# Patient Record
Sex: Female | Born: 1970
Health system: Southern US, Community
[De-identification: ages and names within clinical notes are randomized; demographics above are authoritative.]

## PROBLEM LIST (undated history)

## (undated) DIAGNOSIS — N200 Calculus of kidney: Secondary | ICD-10-CM

## (undated) DIAGNOSIS — E119 Type 2 diabetes mellitus without complications: Secondary | ICD-10-CM

## (undated) DIAGNOSIS — I10 Essential (primary) hypertension: Secondary | ICD-10-CM

## (undated) DIAGNOSIS — D649 Anemia, unspecified: Secondary | ICD-10-CM

## (undated) DIAGNOSIS — R011 Cardiac murmur, unspecified: Secondary | ICD-10-CM

## (undated) DIAGNOSIS — R911 Solitary pulmonary nodule: Secondary | ICD-10-CM

## (undated) DIAGNOSIS — R42 Dizziness and giddiness: Secondary | ICD-10-CM

## (undated) HISTORY — PX: WISDOM TOOTH EXTRACTION: SHX21

## (undated) HISTORY — DX: Type 2 diabetes mellitus without complications: E11.9

## (undated) HISTORY — PX: TUBAL LIGATION: SHX77

## (undated) HISTORY — DX: Cardiac murmur, unspecified: R01.1

## (undated) HISTORY — DX: Solitary pulmonary nodule: R91.1

## (undated) HISTORY — DX: Dizziness and giddiness: R42

## (undated) HISTORY — DX: Anemia, unspecified: D64.9

## (undated) HISTORY — DX: Calculus of kidney: N20.0

---

## 2007-01-17 ENCOUNTER — Emergency Department (HOSPITAL_COMMUNITY): Admission: EM | Admit: 2007-01-17 | Discharge: 2007-01-17 | Payer: Self-pay | Admitting: Emergency Medicine

## 2010-08-03 ENCOUNTER — Emergency Department (HOSPITAL_COMMUNITY)
Admission: EM | Admit: 2010-08-03 | Discharge: 2010-08-03 | Payer: Self-pay | Source: Home / Self Care | Admitting: Emergency Medicine

## 2011-02-08 ENCOUNTER — Other Ambulatory Visit: Payer: Self-pay | Admitting: Obstetrics and Gynecology

## 2011-02-08 DIAGNOSIS — Z1231 Encounter for screening mammogram for malignant neoplasm of breast: Secondary | ICD-10-CM

## 2011-02-23 ENCOUNTER — Ambulatory Visit: Payer: Self-pay

## 2011-03-02 ENCOUNTER — Ambulatory Visit
Admission: RE | Admit: 2011-03-02 | Discharge: 2011-03-02 | Disposition: A | Discharge status: Home / Self Care | Payer: BC Managed Care – PPO | Source: Ambulatory Visit | Attending: Obstetrics and Gynecology | Admitting: Obstetrics and Gynecology

## 2011-03-02 DIAGNOSIS — Z1231 Encounter for screening mammogram for malignant neoplasm of breast: Secondary | ICD-10-CM

## 2011-03-22 ENCOUNTER — Other Ambulatory Visit: Payer: Self-pay | Admitting: Obstetrics and Gynecology

## 2011-03-22 DIAGNOSIS — R928 Other abnormal and inconclusive findings on diagnostic imaging of breast: Secondary | ICD-10-CM

## 2011-03-29 ENCOUNTER — Ambulatory Visit
Admission: RE | Admit: 2011-03-29 | Discharge: 2011-03-29 | Disposition: A | Discharge status: Home / Self Care | Payer: BC Managed Care – PPO | Source: Ambulatory Visit | Attending: Obstetrics and Gynecology | Admitting: Obstetrics and Gynecology

## 2011-03-29 DIAGNOSIS — R928 Other abnormal and inconclusive findings on diagnostic imaging of breast: Secondary | ICD-10-CM

## 2011-04-25 LAB — BASIC METABOLIC PANEL
BUN: 8
Chloride: 104
GFR calc non Af Amer: >60
Glucose, Bld: 151 — ABNORMAL HIGH
Potassium: 3.2 — ABNORMAL LOW
Sodium: 140

## 2011-04-25 LAB — URINE MICROSCOPIC-ADD ON: WBC, UA: NONE SEEN

## 2011-04-25 LAB — URINALYSIS, ROUTINE W REFLEX MICROSCOPIC
Glucose, UA: NEGATIVE
Hgb urine dipstick: NEGATIVE
Ketones, ur: NEGATIVE
Protein, ur: NEGATIVE
pH: 7.5

## 2012-02-21 ENCOUNTER — Other Ambulatory Visit: Payer: Self-pay | Admitting: Obstetrics and Gynecology

## 2012-02-21 DIAGNOSIS — Z1231 Encounter for screening mammogram for malignant neoplasm of breast: Secondary | ICD-10-CM

## 2012-03-29 ENCOUNTER — Ambulatory Visit
Admission: RE | Admit: 2012-03-29 | Discharge: 2012-03-29 | Disposition: A | Discharge status: Home / Self Care | Payer: BC Managed Care – PPO | Source: Ambulatory Visit | Attending: Obstetrics and Gynecology | Admitting: Obstetrics and Gynecology

## 2012-03-29 DIAGNOSIS — Z1231 Encounter for screening mammogram for malignant neoplasm of breast: Secondary | ICD-10-CM

## 2012-04-01 ENCOUNTER — Ambulatory Visit: Payer: Self-pay | Admitting: Obstetrics and Gynecology

## 2012-04-02 ENCOUNTER — Encounter: Payer: Self-pay | Admitting: Obstetrics and Gynecology

## 2012-04-02 ENCOUNTER — Ambulatory Visit (INDEPENDENT_AMBULATORY_CARE_PROVIDER_SITE_OTHER): Payer: BC Managed Care – PPO | Admitting: Obstetrics and Gynecology

## 2012-04-02 VITALS — BP 108/68 | Ht 66.0 in | Wt 192.0 lb

## 2012-04-02 DIAGNOSIS — Z124 Encounter for screening for malignant neoplasm of cervix: Secondary | ICD-10-CM

## 2012-04-02 NOTE — Progress Notes (Signed)
Contraception BTL Last pap 02/2011 WNL Last Mammo 03/29/2012 No results as yet Last Colonoscopy None Last Dexa Scan None Primary MD Laurine Blazer Abuse at Home None

## 2012-04-03 NOTE — Progress Notes (Signed)
Patient  R/s to 04/15/12 and was not seen due to MD delay in surgery

## 2012-04-15 ENCOUNTER — Ambulatory Visit (INDEPENDENT_AMBULATORY_CARE_PROVIDER_SITE_OTHER): Payer: BC Managed Care – PPO | Admitting: Obstetrics and Gynecology

## 2012-04-15 ENCOUNTER — Encounter: Payer: Self-pay | Admitting: Obstetrics and Gynecology

## 2012-04-15 VITALS — BP 112/80 | Ht 66.0 in | Wt 194.0 lb

## 2012-04-15 DIAGNOSIS — Z139 Encounter for screening, unspecified: Secondary | ICD-10-CM

## 2012-04-15 DIAGNOSIS — Z124 Encounter for screening for malignant neoplasm of cervix: Secondary | ICD-10-CM

## 2012-04-15 DIAGNOSIS — Z01419 Encounter for gynecological examination (general) (routine) without abnormal findings: Secondary | ICD-10-CM

## 2012-04-15 DIAGNOSIS — N920 Excessive and frequent menstruation with regular cycle: Secondary | ICD-10-CM

## 2012-04-15 LAB — CBC
Hemoglobin: 11.5 g/dL — ABNORMAL LOW (ref 12.0–15.0)
MCH: 24.5 pg — ABNORMAL LOW (ref 26.0–34.0)
Platelets: 332 10*3/uL (ref 150–400)
RBC: 4.7 MIL/uL (ref 3.87–5.11)
WBC: 6 10*3/uL (ref 4.0–10.5)

## 2012-04-15 LAB — TSH: TSH: 1.237 u[IU]/mL (ref 0.350–4.500)

## 2012-04-15 NOTE — Addendum Note (Signed)
Addended by: Mathis Bud on: 04/15/2012 12:01 PM   Modules accepted: Orders

## 2012-04-15 NOTE — Progress Notes (Signed)
Contraception BTL Last pap 02/08/2011 WNL Last Mammo 03/29/2012 WNL Last Colonoscopy None Last Dexa Scan None Primary MD Johnn Hai Abuse at Home None  Heavy cycles x 3ths changing pad q1hr or less first two days.  Filed Vitals:   04/15/12 1016  BP: 112/80   ROS: noncontributory  Physical Examination: General appearance - alert, well appearing, and in no distress Neck - supple, no significant adenopathy Chest - clear to auscultation, no wheezes, rales or rhonchi, symmetric air entry Heart - normal rate and regular rhythm Abdomen - soft, nontender, nondistended, no masses or organomegaly Breasts - breasts appear normal, no suspicious masses, no skin or nipple changes or axillary nodes Pelvic - normal external genitalia, vulva, vagina, cervix, uterus and adnexa, light blood in vault (menses ending) Back exam - no CVAT Extremities - no edema, redness or tenderness in the calves or thighs  A/P Pap today sched u/s and labs today Em Bx at [facility]

## 2012-04-16 LAB — PAP IG W/ RFLX HPV ASCU

## 2012-04-25 ENCOUNTER — Other Ambulatory Visit: Payer: BC Managed Care – PPO

## 2012-04-25 ENCOUNTER — Encounter: Payer: BC Managed Care – PPO | Admitting: Obstetrics and Gynecology

## 2012-05-08 ENCOUNTER — Other Ambulatory Visit: Payer: Self-pay | Admitting: Obstetrics and Gynecology

## 2012-05-08 ENCOUNTER — Encounter: Payer: Self-pay | Admitting: Obstetrics and Gynecology

## 2012-05-08 ENCOUNTER — Ambulatory Visit (INDEPENDENT_AMBULATORY_CARE_PROVIDER_SITE_OTHER): Payer: BC Managed Care – PPO | Admitting: Obstetrics and Gynecology

## 2012-05-08 ENCOUNTER — Ambulatory Visit (INDEPENDENT_AMBULATORY_CARE_PROVIDER_SITE_OTHER): Payer: BC Managed Care – PPO

## 2012-05-08 VITALS — BP 110/80 | Ht 66.0 in | Wt 194.0 lb

## 2012-05-08 DIAGNOSIS — N920 Excessive and frequent menstruation with regular cycle: Secondary | ICD-10-CM

## 2012-05-08 DIAGNOSIS — D259 Leiomyoma of uterus, unspecified: Secondary | ICD-10-CM

## 2012-05-08 DIAGNOSIS — D219 Benign neoplasm of connective and other soft tissue, unspecified: Secondary | ICD-10-CM

## 2012-05-08 NOTE — Progress Notes (Signed)
Here to f/u u/s and labs and have embx done  Filed Vitals:   05/08/12 1048  BP: 110/80   TSH and PRL wnl Hgb 11.5 U/S - ut 9.8x7.7x7.5, bil hemorrhagic cysts meas rt 4.0 and lt 4.7, several fibroids, 2.4, 2.5 and 6.6cms, none submucosal  A/P Pt wants embx at [facility] I reviewed options and recs Pt wants ablation - she has a BTL and has had 4 nsvds and is likely a good in office candidate Will plan for hysteroscopy, D&C and ablation in office as long as em bx at [facility] is neg

## 2012-06-19 ENCOUNTER — Encounter: Payer: BC Managed Care – PPO | Admitting: Obstetrics and Gynecology

## 2013-02-25 ENCOUNTER — Other Ambulatory Visit: Payer: Self-pay

## 2013-02-25 DIAGNOSIS — Z1231 Encounter for screening mammogram for malignant neoplasm of breast: Secondary | ICD-10-CM

## 2013-03-15 ENCOUNTER — Emergency Department (HOSPITAL_COMMUNITY): Payer: BC Managed Care – PPO

## 2013-03-15 ENCOUNTER — Emergency Department (HOSPITAL_COMMUNITY)
Admission: EM | Admit: 2013-03-15 | Discharge: 2013-03-15 | Disposition: A | Discharge status: Home / Self Care | Payer: BC Managed Care – PPO | Attending: Emergency Medicine | Admitting: Emergency Medicine

## 2013-03-15 ENCOUNTER — Encounter (HOSPITAL_COMMUNITY): Payer: Self-pay | Admitting: Emergency Medicine

## 2013-03-15 DIAGNOSIS — R110 Nausea: Secondary | ICD-10-CM | POA: Insufficient documentation

## 2013-03-15 DIAGNOSIS — R300 Dysuria: Secondary | ICD-10-CM | POA: Insufficient documentation

## 2013-03-15 DIAGNOSIS — N80109 Endometriosis of ovary, unspecified side, unspecified depth: Secondary | ICD-10-CM | POA: Insufficient documentation

## 2013-03-15 DIAGNOSIS — R109 Unspecified abdominal pain: Secondary | ICD-10-CM

## 2013-03-15 DIAGNOSIS — I10 Essential (primary) hypertension: Secondary | ICD-10-CM | POA: Insufficient documentation

## 2013-03-15 DIAGNOSIS — N801 Endometriosis of ovary: Secondary | ICD-10-CM

## 2013-03-15 DIAGNOSIS — Z79899 Other long term (current) drug therapy: Secondary | ICD-10-CM | POA: Insufficient documentation

## 2013-03-15 DIAGNOSIS — R350 Frequency of micturition: Secondary | ICD-10-CM | POA: Insufficient documentation

## 2013-03-15 DIAGNOSIS — R011 Cardiac murmur, unspecified: Secondary | ICD-10-CM | POA: Insufficient documentation

## 2013-03-15 HISTORY — DX: Essential (primary) hypertension: I10

## 2013-03-15 LAB — COMPREHENSIVE METABOLIC PANEL
ALT: 15 U/L (ref 0–35)
AST: 14 U/L (ref 0–37)
Albumin: 3.5 g/dL (ref 3.5–5.2)
CO2: 26 mEq/L (ref 19–32)
Chloride: 104 mEq/L (ref 96–112)
Creatinine, Ser: 0.75 mg/dL (ref 0.50–1.10)
GFR calc non Af Amer: >90 mL/min (ref >90)
Potassium: 3.9 mEq/L (ref 3.5–5.1)
Sodium: 139 mEq/L (ref 135–145)
Total Bilirubin: 0.5 mg/dL (ref 0.3–1.2)

## 2013-03-15 LAB — CBC WITH DIFFERENTIAL/PLATELET
Basophils Absolute: 0 10*3/uL (ref 0.0–0.1)
Basophils Relative: 0 % (ref 0–1)
HCT: 35.1 % — ABNORMAL LOW (ref 36.0–46.0)
Lymphocytes Relative: 19 % (ref 12–46)
MCHC: 33 g/dL (ref 30.0–36.0)
Monocytes Absolute: 0.5 10*3/uL (ref 0.1–1.0)
Neutro Abs: 5.9 10*3/uL (ref 1.7–7.7)
Neutrophils Relative %: 74 % (ref 43–77)
Platelets: 311 10*3/uL (ref 150–400)
RDW: 16.7 % — ABNORMAL HIGH (ref 11.5–15.5)
WBC: 8 10*3/uL (ref 4.0–10.5)

## 2013-03-15 LAB — URINALYSIS, ROUTINE W REFLEX MICROSCOPIC
Glucose, UA: NEGATIVE mg/dL
Hgb urine dipstick: NEGATIVE
Ketones, ur: 15 mg/dL — AB
Leukocytes, UA: NEGATIVE
pH: 6 (ref 5.0–8.0)

## 2013-03-15 LAB — WET PREP, GENITAL
Trich, Wet Prep: NONE SEEN
Yeast Wet Prep HPF POC: NONE SEEN

## 2013-03-15 LAB — POCT PREGNANCY, URINE: Preg Test, Ur: NEGATIVE

## 2013-03-15 MED ORDER — MORPHINE SULFATE 4 MG/ML IJ SOLN
4.0000 mg | Freq: Once | INTRAMUSCULAR | Status: AC
  Administered 2013-03-15: 4 mg via INTRAVENOUS
  Filled 2013-03-15: qty 1

## 2013-03-15 MED ORDER — HYDROCODONE-ACETAMINOPHEN 5-325 MG PO TABS: 2.0000 | ORAL_TABLET | Freq: Four times a day (QID) | ORAL | Status: DC | PRN

## 2013-03-15 MED ORDER — ONDANSETRON HCL 4 MG/2ML IJ SOLN
4.0000 mg | Freq: Once | INTRAMUSCULAR | Status: AC
  Administered 2013-03-15: 4 mg via INTRAVENOUS
  Filled 2013-03-15: qty 2

## 2013-03-15 MED ORDER — ONDANSETRON HCL 4 MG PO TABS: 4.0000 mg | ORAL_TABLET | Freq: Four times a day (QID) | ORAL | Status: DC

## 2013-03-15 MED ORDER — SODIUM CHLORIDE 0.9 % IV BOLUS (SEPSIS)
1000.0000 mL | Freq: Once | INTRAVENOUS | Status: AC
  Administered 2013-03-15: 1000 mL via INTRAVENOUS

## 2013-03-15 NOTE — ED Notes (Signed)
ekg was done at our urgent care the ekg was shown to Dr. Preston Fleeting

## 2013-03-15 NOTE — ED Notes (Signed)
Patient reports suprapubic pain onset last night. States pain worse after urination. Denies vaginal discharge or bleeding. Denies diarrhea, denies vomiting. States had some nausea last night. Denies fever. Denies back pain. Abdomen soft. Some grimace when palpated suprapubic

## 2013-03-15 NOTE — ED Provider Notes (Signed)
Pt received from PA Merrell at shift change.  Pt presenting to the ED for sudden onset abdominal pain at 1am, worse with movement and urination.  Mild nausea, resolved at this time.  Last BM yesterday, non-bloody.  LMP 8/28.  No vaginal discharge or bleeding.  CT revealing mildly enlarged left ovary.  Plan:  Pelvic u/s pending.  Likely d/c home with OB FU.  Results for orders placed during the hospital encounter of 03/15/13  WET PREP, GENITAL      Result Value Range   Yeast Wet Prep HPF POC NONE SEEN  NONE SEEN   Trich, Wet Prep NONE SEEN  NONE SEEN   Clue Cells Wet Prep HPF POC NONE SEEN  NONE SEEN   WBC, Wet Prep HPF POC RARE (*) NONE SEEN  CBC WITH DIFFERENTIAL      Result Value Range   WBC 8.0  4.0 - 10.5 K/uL   RBC 4.68  3.87 - 5.11 MIL/uL   Hemoglobin 11.6 (*) 12.0 - 15.0 g/dL   HCT 62.1 (*) 30.8 - 65.7 %   MCV 75.0 (*) 78.0 - 100.0 fL   MCH 24.8 (*) 26.0 - 34.0 pg   MCHC 33.0  30.0 - 36.0 g/dL   RDW 84.6 (*) 96.2 - 95.2 %   Platelets 311  150 - 400 K/uL   Neutrophils Relative % 74  43 - 77 %   Neutro Abs 5.9  1.7 - 7.7 K/uL   Lymphocytes Relative 19  12 - 46 %   Lymphs Abs 1.5  0.7 - 4.0 K/uL   Monocytes Relative 6  3 - 12 %   Monocytes Absolute 0.5  0.1 - 1.0 K/uL   Eosinophils Relative 1  0 - 5 %   Eosinophils Absolute 0.1  0.0 - 0.7 K/uL   Basophils Relative 0  0 - 1 %   Basophils Absolute 0.0  0.0 - 0.1 K/uL  COMPREHENSIVE METABOLIC PANEL      Result Value Range   Sodium 139  135 - 145 mEq/L   Potassium 3.9  3.5 - 5.1 mEq/L   Chloride 104  96 - 112 mEq/L   CO2 26  19 - 32 mEq/L   Glucose, Bld 93  70 - 99 mg/dL   BUN 12  6 - 23 mg/dL   Creatinine, Ser 8.41  0.50 - 1.10 mg/dL   Calcium 8.9  8.4 - 32.4 mg/dL   Total Protein 6.7  6.0 - 8.3 g/dL   Albumin 3.5  3.5 - 5.2 g/dL   AST 14  0 - 37 U/L   ALT 15  0 - 35 U/L   Alkaline Phosphatase 48  39 - 117 U/L   Total Bilirubin 0.5  0.3 - 1.2 mg/dL   GFR calc non Af Amer >90  >90 mL/min   GFR calc Af Amer >90  >90  mL/min  LIPASE, BLOOD      Result Value Range   Lipase 19  11 - 59 U/L  URINALYSIS, ROUTINE W REFLEX MICROSCOPIC      Result Value Range   Color, Urine YELLOW  YELLOW   APPearance CLEAR  CLEAR   Specific Gravity, Urine 1.038 (*) 1.005 - 1.030   pH 6.0  5.0 - 8.0   Glucose, UA NEGATIVE  NEGATIVE mg/dL   Hgb urine dipstick NEGATIVE  NEGATIVE   Bilirubin Urine NEGATIVE  NEGATIVE   Ketones, ur 15 (*) NEGATIVE mg/dL   Protein, ur NEGATIVE  NEGATIVE mg/dL  Urobilinogen, UA 0.2  0.0 - 1.0 mg/dL   Nitrite NEGATIVE  NEGATIVE   Leukocytes, UA NEGATIVE  NEGATIVE  POCT PREGNANCY, URINE      Result Value Range   Preg Test, Ur NEGATIVE  NEGATIVE   Ct Abdomen Pelvis Wo Contrast  03/15/2013   *RADIOLOGY REPORT*  Clinical Data: Abdominal pain for the past 12 hours.  CT ABDOMEN AND PELVIS WITHOUT CONTRAST  Technique:  Multidetector CT imaging of the abdomen and pelvis was performed following the standard protocol without intravenous contrast.  Comparison: No priors.  Findings:  Lung Bases: Minimal dependent atelectasis in the lower lobes of the lungs bilaterally.  Abdomen/Pelvis:  Numerous calcified gallstones fill the gallbladder.  No signs to suggest acute cholecystitis at this time. The unenhanced appearance of the liver, pancreas, spleen, bilateral adrenal glands and bilateral kidneys is unremarkable. Specifically, there are no abnormal calcifications within the collecting system of either kidney, along the course of either ureter, or within the lumen of the urinary bladder to suggest urinary tract calculi at this time.  No hydroureteronephrosis or perinephric stranding to suggest urinary tract obstruction at this time.  No significant volume of ascites.  No pneumoperitoneum.  No pathologic distension of small bowel.  Normal appendix.  No definite pathologic lymphadenopathy identified within the abdomen or pelvis on today's noncontrast CT examination.  The uterus is enlarged and heterogeneous in appearance,  suggesting the presence of multiple fibroids.  Ovaries are poorly demonstrated on today's noncontrast CT examination, however, the left ovary appears mildly enlarged measuring up to approximately 6.0 x 3.6 x 4.3 cm.  Musculoskeletal: There are no aggressive appearing lytic or blastic lesions noted in the visualized portions of the skeleton.  IMPRESSION: 1.  Cholelithiasis without evidence to suggest acute cholecystitis. 2.  Normal appendix. 3.  Probable fibroid uterus. 4.  The ovaries are poorly demonstrated on today's noncontrast CT examination, however, the left ovary appears mildly enlarged measuring approximate 6.0 x 3.6 x 4.3 cm.  This may simply be an incidental finding, however, if the patient has pain referable to the left pelvic region, further evaluation with pelvic ultrasound may be warranted.   Original Report Authenticated By: Trudie Reed, M.D.   US Transvaginal Non-ob  03/15/2013   *RADIOLOGY REPORT*  Clinical Data: Pelvic pain.  TRANSVAGINAL ULTRASOUND OF PELVIS  Technique:  Transvaginal ultrasound examination of the pelvis was performed including evaluation of the uterus, ovaries, adnexal regions, and pelvic cul-de-sac.  Comparison:  CT of the abdomen and pelvis 03/1959 1014.  Findings:  Uterus:  Uterus is slightly heterogeneous in appearance with multiple focal lesions.  The largest of these is in the posterior aspect of the fundus measuring 5.6 x 5.2 x 4.4 cm demonstrating heterogeneous echotexture, most compatible with multiple fibroids. Overall, the uterus measures approximately 13.0 x 7.0 x 9.0 cm. Small Nabothian cysts in the cervix.  Endometrium: Heterogeneous in echotexture, but normal in thickness measuring 11.8 mm.  Right ovary: Normal in echotexture and appearance measuring 3.2 x 2.7 x 2.1 cm.  Multiple small follicles.  Left ovary: Enlarged measuring 6.5 x 2.6 x 4.9 cm.  Within the left ovary there is a 6.2 x 2.0 x 4.3 cm hypoechoic lesion with increased through transmission which  has a low level internal echoes, favored to represent an endometrioma.  Other Findings:  Trace volume of free fluid the cul-de-sac.  IMPRESSION: 1.  The lesion of concern on the recent CT scan in the left ovary has imaging characteristics most compatible with an  endometrioma. A repeat transvaginal ultrasound in 6-12 weeks would be useful to evaluate for stability or resolution of this finding. 2.  Trace volume of free fluid the cul-de-sac is presumably physiologic. 3.  Fibroid uterus, as above.   Original Report Authenticated By: Trudie Reed, M.D.   Left ovarian lesion consistent with endometrioma, recommended repeat u/s in 6-12 weeks.  Also noted fibroid uterus.  I have discussed u/s finding with pt and husband-- she has no known hx of endometriosis.  Pt afebrile, non-toxic appearing, NAD, VS stable- ok for discharge.  She has an OB-GYN that she will FU with for repeat u/s.  Pain meds and anti-emetics given per PA Merrell.  Discussed plan with pt, information given-- she acknowledged understanding and agreed.  Return precautions advised.  VS stable at time of d/c.  Filed Vitals:   03/15/13 1924  BP: 129/88  Pulse: 73  Temp:   Resp:       Garlon Hatchet, PA-C 03/15/13 2355

## 2013-03-15 NOTE — ED Notes (Signed)
Pt. Stated, i woke up around 0100 with lower abdomen pain, Denies any other symptoms

## 2013-03-15 NOTE — ED Provider Notes (Signed)
CSN: [id number]     Arrival date & time 03/15/13  0813 History   First MD Initiated Contact with Patient 03/15/13 [phone]     Chief Complaint  Patient presents with  . Abdominal Pain   (Consider location/radiation/quality/duration/timing/severity/associated sxs/prior Treatment) HPI Comments: Patient a 42 year old female with history of hypertension who presents today with sudden onset of abdominal pain since 1 AM this morning. It is worse with movement and urination. The pain is sharp. When she lays still the pain is dull. The pain is mostly in her lower quadrants, but does radiate to her epigastric area. She has not done anything for the pain. She did have some mild nausea, but this has resolved. She admits to urinary frequency. Is not associated with any vomiting or diarrhea. Her last bowel movement was yesterday. This was normal for her. Her last menstrual period was on 8/28. She denies any vaginal discharge, vaginal bleeding, urinary urgency, Fever, chills, shortness of breath, chest pain.   Past Medical History  Diagnosis Date  . Heart murmur   . Hypertension    Past Surgical History  Procedure Laterality Date  . Wisdom tooth extraction    . Tubal ligation     Family History  Problem Relation Age of Onset  . Heart disease Father   . Diabetes Maternal Grandmother    History  Substance Use Topics  . Smoking status: Never Smoker   . Smokeless tobacco: Never Used  . Alcohol Use: No   OB History   Grav Para Term Preterm Abortions TAB SAB Ect Mult Living   4 4 4       4      Review of Systems  Constitutional: Negative for fever and chills.  Respiratory: Negative for shortness of breath.   Cardiovascular: Negative for chest pain.  Gastrointestinal: Positive for nausea and abdominal pain. Negative for vomiting, diarrhea and constipation.  Genitourinary: Positive for dysuria and frequency. Negative for urgency, hematuria, vaginal bleeding, vaginal discharge and vaginal pain.  All  other systems reviewed and are negative.    Allergies  Review of patient's allergies indicates no known allergies.  Home Medications   Current Outpatient Rx  Name  Route  Sig  Dispense  Refill  . atenolol (TENORMIN) 50 MG tablet   Oral   Take 50 mg by mouth daily.         . ferrous fumarate (HEMOCYTE - 106 MG FE) 325 (106 FE) MG TABS   Oral   Take 1 tablet by mouth.         . furosemide (LASIX) 10 MG/ML solution   Oral   Take by mouth daily.          BP 139/58  Pulse 60  Temp(Src) 98 F (36.7 C) (Oral)  Resp 20  SpO2 98%  LMP 03/06/2013 Physical Exam  Nursing note and vitals reviewed. Constitutional: She is oriented to person, place, and time. She appears well-developed and well-nourished. No distress.  HENT:  Head: Normocephalic and atraumatic.  Right Ear: External ear normal.  Left Ear: External ear normal.  Nose: Nose normal.  Mouth/Throat: Oropharynx is clear and moist.  Eyes: Conjunctivae are normal.  Neck: Normal range of motion.  Cardiovascular: Normal rate, regular rhythm and normal heart sounds.   Pulmonary/Chest: Effort normal and breath sounds normal. No stridor. No respiratory distress. She has no wheezes. She has no rales.  Abdominal: Soft. She exhibits no distension. There is generalized tenderness. There is no rigidity, no rebound and no guarding.  Hernia confirmed negative in the right inguinal area and confirmed negative in the left inguinal area.  Genitourinary: Vagina normal. No labial fusion. There is no rash, tenderness, lesion or injury on the right labia. There is no rash, tenderness, lesion or injury on the left labia. Uterus is tender. Uterus is not enlarged. Cervix exhibits discharge (white). Cervix exhibits no motion tenderness and no friability. Right adnexum displays tenderness. Right adnexum displays no mass and no fullness. Left adnexum displays tenderness. Left adnexum displays no mass and no fullness. No erythema, tenderness or  bleeding around the vagina. No foreign body around the vagina. No signs of injury around the vagina. No vaginal discharge found.  Musculoskeletal: Normal range of motion.  Lymphadenopathy:       Right: No inguinal adenopathy present.       Left: No inguinal adenopathy present.  Neurological: She is alert and oriented to person, place, and time. She has normal strength.  Skin: Skin is warm and dry. She is not diaphoretic. No erythema.  Psychiatric: She has a normal mood and affect. Her behavior is normal.    ED Course  Procedures (including critical care time) Labs Review Labs Reviewed  WET PREP, GENITAL - Abnormal; Notable for the following:    WBC, Wet Prep HPF POC RARE (*)    All other components within normal limits  CBC WITH DIFFERENTIAL - Abnormal; Notable for the following:    Hemoglobin 11.6 (*)    HCT 35.1 (*)    MCV 75.0 (*)    MCH 24.8 (*)    RDW 16.7 (*)    All other components within normal limits  URINALYSIS, ROUTINE W REFLEX MICROSCOPIC - Abnormal; Notable for the following:    Specific Gravity, Urine 1.038 (*)    Ketones, ur 15 (*)    All other components within normal limits  GC/CHLAMYDIA PROBE AMP  COMPREHENSIVE METABOLIC PANEL  LIPASE, BLOOD  POCT PREGNANCY, URINE   Imaging Review Ct Abdomen Pelvis Wo Contrast  03/15/2013   *RADIOLOGY REPORT*  Clinical Data: Abdominal pain for the past 12 hours.  CT ABDOMEN AND PELVIS WITHOUT CONTRAST  Technique:  Multidetector CT imaging of the abdomen and pelvis was performed following the standard protocol without intravenous contrast.  Comparison: No priors.  Findings:  Lung Bases: Minimal dependent atelectasis in the lower lobes of the lungs bilaterally.  Abdomen/Pelvis:  Numerous calcified gallstones fill the gallbladder.  No signs to suggest acute cholecystitis at this time. The unenhanced appearance of the liver, pancreas, spleen, bilateral adrenal glands and bilateral kidneys is unremarkable. Specifically, there are no  abnormal calcifications within the collecting system of either kidney, along the course of either ureter, or within the lumen of the urinary bladder to suggest urinary tract calculi at this time.  No hydroureteronephrosis or perinephric stranding to suggest urinary tract obstruction at this time.  No significant volume of ascites.  No pneumoperitoneum.  No pathologic distension of small bowel.  Normal appendix.  No definite pathologic lymphadenopathy identified within the abdomen or pelvis on today's noncontrast CT examination.  The uterus is enlarged and heterogeneous in appearance, suggesting the presence of multiple fibroids.  Ovaries are poorly demonstrated on today's noncontrast CT examination, however, the left ovary appears mildly enlarged measuring up to approximately 6.0 x 3.6 x 4.3 cm.  Musculoskeletal: There are no aggressive appearing lytic or blastic lesions noted in the visualized portions of the skeleton.  IMPRESSION: 1.  Cholelithiasis without evidence to suggest acute cholecystitis. 2.  Normal appendix.  3.  Probable fibroid uterus. 4.  The ovaries are poorly demonstrated on today's noncontrast CT examination, however, the left ovary appears mildly enlarged measuring approximate 6.0 x 3.6 x 4.3 cm.  This may simply be an incidental finding, however, if the patient has pain referable to the left pelvic region, further evaluation with pelvic ultrasound may be warranted.   Original Report Authenticated By: Trudie Reed, M.D.   US Transvaginal Non-ob  03/15/2013   *RADIOLOGY REPORT*  Clinical Data: Pelvic pain.  TRANSVAGINAL ULTRASOUND OF PELVIS  Technique:  Transvaginal ultrasound examination of the pelvis was performed including evaluation of the uterus, ovaries, adnexal regions, and pelvic cul-de-sac.  Comparison:  CT of the abdomen and pelvis 03/1959 1014.  Findings:  Uterus:  Uterus is slightly heterogeneous in appearance with multiple focal lesions.  The largest of these is in the posterior  aspect of the fundus measuring 5.6 x 5.2 x 4.4 cm demonstrating heterogeneous echotexture, most compatible with multiple fibroids. Overall, the uterus measures approximately 13.0 x 7.0 x 9.0 cm. Small Nabothian cysts in the cervix.  Endometrium: Heterogeneous in echotexture, but normal in thickness measuring 11.8 mm.  Right ovary: Normal in echotexture and appearance measuring 3.2 x 2.7 x 2.1 cm.  Multiple small follicles.  Left ovary: Enlarged measuring 6.5 x 2.6 x 4.9 cm.  Within the left ovary there is a 6.2 x 2.0 x 4.3 cm hypoechoic lesion with increased through transmission which has a low level internal echoes, favored to represent an endometrioma.  Other Findings:  Trace volume of free fluid the cul-de-sac.  IMPRESSION: 1.  The lesion of concern on the recent CT scan in the left ovary has imaging characteristics most compatible with an endometrioma. A repeat transvaginal ultrasound in 6-12 weeks would be useful to evaluate for stability or resolution of this finding. 2.  Trace volume of free fluid the cul-de-sac is presumably physiologic. 3.  Fibroid uterus, as above.   Original Report Authenticated By: Trudie Reed, M.D.    MDM  No diagnosis found.  Patient is a 42 year old female who presents today with sudden onset abdominal pain. Abdomen is soft. Some improvement with morphine. Labs unremarkable. CT findings show cholelithiasis without evidence to suggest acute cholecystitis, normal appendix, probable fibroid uterus. Currently pelvic US pending. Dependant on results of [place] pt will be dc'd with antiemetics and pain medication. Follow up with OB likely. Patient signed out to Burton, PA-C at change of shift.     Mora Bellman, PA-C 03/16/13 1040

## 2013-03-17 NOTE — ED Provider Notes (Signed)
History/physical exam/procedure(s) were performed by non-physician practitioner and as supervising physician I was immediately available for consultation/collaboration. I have reviewed all notes and am in agreement with care and plan.   Hilario Quarry, MD 03/17/13 1235

## 2013-04-01 ENCOUNTER — Ambulatory Visit
Admission: RE | Admit: 2013-04-01 | Discharge: 2013-04-01 | Disposition: A | Discharge status: Home / Self Care | Payer: BC Managed Care – PPO | Source: Ambulatory Visit

## 2013-04-01 DIAGNOSIS — Z1231 Encounter for screening mammogram for malignant neoplasm of breast: Secondary | ICD-10-CM

## 2013-05-15 ENCOUNTER — Other Ambulatory Visit: Payer: Self-pay

## 2014-02-26 ENCOUNTER — Other Ambulatory Visit: Payer: Self-pay

## 2014-02-26 DIAGNOSIS — Z1231 Encounter for screening mammogram for malignant neoplasm of breast: Secondary | ICD-10-CM

## 2014-04-07 ENCOUNTER — Ambulatory Visit
Admission: RE | Admit: 2014-04-07 | Discharge: 2014-04-07 | Disposition: A | Discharge status: Home / Self Care | Payer: BC Managed Care – PPO | Source: Ambulatory Visit

## 2014-04-07 DIAGNOSIS — Z1231 Encounter for screening mammogram for malignant neoplasm of breast: Secondary | ICD-10-CM

## 2015-07-22 ENCOUNTER — Emergency Department (HOSPITAL_COMMUNITY)
Admission: EM | Admit: 2015-07-22 | Discharge: 2015-07-23 | Disposition: A | Discharge status: Home / Self Care | Payer: Medicaid Other | Attending: Emergency Medicine | Admitting: Emergency Medicine

## 2015-07-22 ENCOUNTER — Emergency Department (HOSPITAL_COMMUNITY): Payer: Medicaid Other

## 2015-07-22 ENCOUNTER — Encounter (HOSPITAL_COMMUNITY): Payer: Self-pay

## 2015-07-22 DIAGNOSIS — R011 Cardiac murmur, unspecified: Secondary | ICD-10-CM | POA: Insufficient documentation

## 2015-07-22 DIAGNOSIS — R079 Chest pain, unspecified: Secondary | ICD-10-CM | POA: Diagnosis present

## 2015-07-22 DIAGNOSIS — I10 Essential (primary) hypertension: Secondary | ICD-10-CM | POA: Diagnosis not present

## 2015-07-22 DIAGNOSIS — Z79899 Other long term (current) drug therapy: Secondary | ICD-10-CM | POA: Insufficient documentation

## 2015-07-22 DIAGNOSIS — R200 Anesthesia of skin: Secondary | ICD-10-CM | POA: Insufficient documentation

## 2015-07-22 DIAGNOSIS — Z7952 Long term (current) use of systemic steroids: Secondary | ICD-10-CM | POA: Diagnosis not present

## 2015-07-22 DIAGNOSIS — R51 Headache: Secondary | ICD-10-CM | POA: Diagnosis not present

## 2015-07-22 LAB — BASIC METABOLIC PANEL
Anion gap: 12 (ref 5–15)
BUN: 14 mg/dL (ref 6–20)
CHLORIDE: 102 mmol/L (ref 101–111)
CO2: 25 mmol/L (ref 22–32)
CREATININE: 0.8 mg/dL (ref 0.44–1.00)
Calcium: 9.1 mg/dL (ref 8.9–10.3)
GFR calc non Af Amer: >60 mL/min (ref >60)
Glucose, Bld: 119 mg/dL — ABNORMAL HIGH (ref 65–99)
Potassium: 3.7 mmol/L (ref 3.5–5.1)
Sodium: 139 mmol/L (ref 135–145)

## 2015-07-22 LAB — CBC
HCT: 35.9 % — ABNORMAL LOW (ref 36.0–46.0)
HEMOGLOBIN: 11.4 g/dL — AB (ref 12.0–15.0)
MCH: 23.4 pg — AB (ref 26.0–34.0)
MCHC: 31.8 g/dL (ref 30.0–36.0)
MCV: 73.7 fL — AB (ref 78.0–100.0)
PLATELETS: 302 10*3/uL (ref 150–400)
RBC: 4.87 MIL/uL (ref 3.87–5.11)
RDW: 16.9 % — ABNORMAL HIGH (ref 11.5–15.5)
WBC: 7.3 10*3/uL (ref 4.0–10.5)

## 2015-07-22 LAB — I-STAT TROPONIN, ED: Troponin i, poc: 0 ng/mL (ref 0.00–0.08)

## 2015-07-22 NOTE — ED Provider Notes (Signed)
CSN: [id number]     Arrival date & time 07/22/15  1939 History  By signing my name below, I, [name], attest that this documentation has been prepared under the direction and in the presence of Laqueshia Cihlar, MD . Electronically Signed: [name], Scribe. 07/23/2015. 12:10 AM.    Chief Complaint  Patient presents with  . Headache  . Chest Pain     Patient is a 45 y.o. female presenting with headaches and chest pain. The history is provided by the patient. No language interpreter was used.  Headache Pain location:  L parietal Radiates to:  Does not radiate Severity currently:  2/10 Timing:  Constant Progression:  Improving Associated symptoms: numbness   Associated symptoms: no abdominal pain and no fever   Chest Pain Pain quality: pressure   Pain radiates to:  Does not radiate Pain radiates to the back: no   Pain severity:  Mild Progression:  Resolved Associated symptoms: headache and numbness   Associated symptoms: no abdominal pain, no fever and no shortness of breath   Risk factors: hypertension      HPI Comments:  Christina Schwartz is a 45 y.o. female with a history of HTN, who presents to the Emergency Department complaining of a "severe" left sided HA onset  ~ 1730 today while driving home. She reports associated chest pressure and LUE numbness. She notes the chest pressure and numbness has resolved and her HA has improved since onset and is a 2/10 at this time. Pt takes atenolol for HTN but has not taken her meds in ~ 1 week, believes it was causing dizziness. No alleviating factors noted.   Past Medical History  Diagnosis Date  . Heart murmur   . Hypertension    Past Surgical History  Procedure Laterality Date  . Wisdom tooth extraction    . Tubal ligation     Family History  Problem Relation Age of Onset  . Heart disease Father   . Diabetes Maternal Grandmother    Social History  Substance Use Topics  . Smoking status: Never Smoker   . Smokeless  tobacco: Never Used  . Alcohol Use: No   OB History    Gravida Para Term Preterm AB TAB SAB Ectopic Multiple Living   4 4 4       4      Review of Systems  Constitutional: Negative for fever and chills.  Respiratory: Negative for shortness of breath and wheezing.   Cardiovascular: Positive for chest pain.  Gastrointestinal: Negative for abdominal pain.  Neurological: Positive for numbness and headaches.  All other systems reviewed and are negative.  Allergies  Review of patient's allergies indicates no known allergies.  Home Medications   Prior to Admission medications   Medication Sig Start Date End Date Taking? Authorizing Provider  acetaminophen (TYLENOL) 500 MG tablet Take 2,000 mg by mouth every 6 (six) hours as needed for pain.    Historical Provider, MD  atenolol (TENORMIN) 50 MG tablet Take 50 mg by mouth daily.    Historical Provider, MD  ferrous fumarate (HEMOCYTE - 106 MG FE) 325 (106 FE) MG TABS Take 1 tablet by mouth.    Historical Provider, MD  furosemide (LASIX) 20 MG tablet Take 20 mg by mouth daily as needed for fluid.    Historical Provider, MD  HYDROcodone-acetaminophen (NORCO/VICODIN) 5-325 MG per tablet Take 2 tablets by mouth every 6 (six) hours as needed for pain. 03/15/13   Cleatrice Burke, PA-C  Iron-Vitamins (GERITOL PO) Take  1 tablet by mouth daily.    Historical Provider, MD  ondansetron (ZOFRAN) 4 MG tablet Take 1 tablet (4 mg total) by mouth every 6 (six) hours. 03/15/13   Cleatrice Burke, PA-C   BP 188/113 mmHg  Pulse 81  Temp(Src) 98.5 F (36.9 C) (Oral)  Resp 18  Ht 5\' 6"  (1.676 m)  Wt 206 lb (93.441 kg)  BMI 33.27 kg/m2  SpO2 100%  LMP 06/24/2015 Physical Exam  Constitutional: She is oriented to person, place, and time. She appears well-developed and well-nourished. No distress.  HENT:  Head: Normocephalic and atraumatic.  Mouth/Throat: Oropharynx is clear and moist. No oropharyngeal exudate.  Moist mucous membranes   Eyes: Conjunctivae are  normal. Pupils are equal, round, and reactive to light.  Neck: Neck supple. No JVD present.  Trachea midline  Cardiovascular: Normal rate, regular rhythm and normal heart sounds.   Pulmonary/Chest: Effort normal and breath sounds normal. No respiratory distress. She has no wheezes. She has no rales.  Abdominal: Soft. Bowel sounds are normal. She exhibits no distension. There is no tenderness. There is no rebound and no guarding.  Musculoskeletal: Normal range of motion. She exhibits no edema or tenderness.  5/5 BUE and BLE strength Sensation intact  Neurological: She is alert and oriented to person, place, and time. She has normal strength and normal reflexes. She displays normal reflexes. No cranial nerve deficit or sensory deficit. She exhibits normal muscle tone. Coordination normal. GCS eye subscore is 4. GCS verbal subscore is 5. GCS motor subscore is 6.  Reflex Scores:      Tricep reflexes are 2+ on the right side and 2+ on the left side.      Bicep reflexes are 2+ on the right side and 2+ on the left side.      Brachioradialis reflexes are 2+ on the right side and 2+ on the left side.      Patellar reflexes are 2+ on the right side and 2+ on the left side.      Achilles reflexes are 2+ on the right side and 2+ on the left side. Skin: Skin is warm and dry.  Psychiatric: She has a normal mood and affect. Her behavior is normal.  Nursing note and vitals reviewed.   ED Course  Procedures   DIAGNOSTIC STUDIES:  Oxygen Saturation is 99% on RA, normal by my interpretation.    COORDINATION OF CARE:  12:06 AM Discussed treatment plan with pt at bedside and pt agreed to plan.  Labs Review Labs Reviewed  BASIC METABOLIC PANEL - Abnormal; Notable for the following:    Glucose, Bld 119 (*)    All other components within normal limits  CBC - Abnormal; Notable for the following:    Hemoglobin 11.4 (*)    HCT 35.9 (*)    MCV 73.7 (*)    MCH 23.4 (*)    RDW 16.9 (*)    All other  components within normal limits  Randolm Idol, ED    Imaging Review Dg Chest 2 View  07/22/2015  CLINICAL DATA:  Acute chest pain. EXAM: CHEST  2 VIEW COMPARISON:  None. FINDINGS: The heart size and mediastinal contours are within normal limits. Both lungs are clear. No pneumothorax or pleural effusion is noted. The visualized skeletal structures are unremarkable. IMPRESSION: No active cardiopulmonary disease. Electronically Signed   By: Marijo Conception, M.D.   On: 07/22/2015 20:21   I have personally reviewed and evaluated these images and lab results as  part of my medical decision-making.   EKG Interpretation   Date/Time:  Thursday July 22 2015 19:46:50 EST Ventricular Rate:  91 PR Interval:  154 QRS Duration: 90 QT Interval:  372 QTC Calculation: 457 R Axis:   74 Text Interpretation:  Normal sinus rhythm Normal ECG Confirmed by  [facility]  MD, Beckhem Isadore ([id number]) on 07/22/2015 11:49:41 PM      MDM   Final diagnoses:  None   Ruled out for MI in the ED.  Head CT is normal.  Patient is feeling better post medication.  Neuro exam is normal.  Stable for discharge at this time.  Restart your atenolol and follow up with your PMd.  Strict return precautions given  I personally performed the services described in this documentation, which was scribed in my presence. The recorded information has been reviewed and is accurate.      Veatrice Kells, MD 07/23/15 0830

## 2015-07-22 NOTE — ED Notes (Signed)
Per [facility], pt was driving home from work and the car lights started giving her a HA. Then started having chest pressure. PCP doubled her atenolol from 25 to 50 mg and she hasn't taken it in a week b/c it made her dizzy. Cp is gone now. HA is 2/10. Had some numbness to her left arm with CP. Has had 324 mg ASA and refused NTG, didn't want the headache to come back .

## 2015-07-23 ENCOUNTER — Encounter (HOSPITAL_COMMUNITY): Payer: Self-pay | Admitting: Emergency Medicine

## 2015-07-23 ENCOUNTER — Emergency Department (HOSPITAL_COMMUNITY): Payer: Medicaid Other

## 2015-07-23 LAB — I-STAT TROPONIN, ED: TROPONIN I, POC: 0.01 ng/mL (ref 0.00–0.08)

## 2015-07-23 MED ORDER — HYDROCHLOROTHIAZIDE 25 MG PO TABS
25.0000 mg | ORAL_TABLET | Freq: Every day | ORAL | Status: DC
  Administered 2015-07-23: 25 mg via ORAL
  Filled 2015-07-23: qty 1

## 2015-07-23 MED ORDER — ATENOLOL 25 MG PO TABS: 25.0000 mg | ORAL_TABLET | Freq: Two times a day (BID) | ORAL | Status: DC

## 2015-07-23 MED ORDER — AMLODIPINE BESYLATE 5 MG PO TABS
5.0000 mg | ORAL_TABLET | Freq: Once | ORAL | Status: AC
  Administered 2015-07-23: 5 mg via ORAL
  Filled 2015-07-23: qty 1

## 2015-07-23 NOTE — Discharge Instructions (Signed)
DASH Eating Plan  DASH stands for "Dietary Approaches to Stop Hypertension." The DASH eating plan is a healthy eating plan that has been shown to reduce high blood pressure (hypertension). Additional health benefits may include reducing the risk of type 2 diabetes mellitus, heart disease, and stroke. The DASH eating plan may also help with weight loss.  WHAT DO I NEED TO KNOW ABOUT THE DASH EATING PLAN?  For the DASH eating plan, you will follow these general guidelines:  · Choose foods with a percent daily value for sodium of less than 5% (as listed on the food label).  · Use salt-free seasonings or herbs instead of table salt or sea salt.  · Check with your health care provider or pharmacist before using salt substitutes.  · Eat lower-sodium products, often labeled as "lower sodium" or "no salt added."  · Eat fresh foods.  · Eat more vegetables, fruits, and low-fat dairy products.  · Choose whole grains. Look for the word "whole" as the first word in the ingredient list.  · Choose fish and skinless chicken or turkey more often than red meat. Limit fish, poultry, and meat to 6 oz (170 g) each day.  · Limit sweets, desserts, sugars, and sugary drinks.  · Choose heart-healthy fats.  · Limit cheese to 1 oz (28 g) per day.  · Eat more home-cooked food and less restaurant, buffet, and fast food.  · Limit fried foods.  · Cook foods using methods other than frying.  · Limit canned vegetables. If you do use them, rinse them well to decrease the sodium.  · When eating at a restaurant, ask that your food be prepared with less salt, or no salt if possible.  WHAT FOODS CAN I EAT?  Seek help from a dietitian for individual calorie needs.  Grains  Whole grain or whole wheat bread. Brown rice. Whole grain or whole wheat pasta. Quinoa, bulgur, and whole grain cereals. Low-sodium cereals. Corn or whole wheat flour tortillas. Whole grain cornbread. Whole grain crackers. Low-sodium crackers.  Vegetables  Fresh or frozen vegetables  (raw, steamed, roasted, or grilled). Low-sodium or reduced-sodium tomato and vegetable juices. Low-sodium or reduced-sodium tomato sauce and paste. Low-sodium or reduced-sodium canned vegetables.   Fruits  All fresh, canned (in natural juice), or frozen fruits.  Meat and Other Protein Products  Ground beef (85% or leaner), grass-fed beef, or beef trimmed of fat. Skinless chicken or turkey. Ground chicken or turkey. Pork trimmed of fat. All fish and seafood. Eggs. Dried beans, peas, or lentils. Unsalted nuts and seeds. Unsalted canned beans.  Dairy  Low-fat dairy products, such as skim or 1% milk, 2% or reduced-fat cheeses, low-fat ricotta or cottage cheese, or plain low-fat yogurt. Low-sodium or reduced-sodium cheeses.  Fats and Oils  Tub margarines without trans fats. Light or reduced-fat mayonnaise and salad dressings (reduced sodium). Avocado. Safflower, olive, or canola oils. Natural peanut or almond butter.  Other  Unsalted popcorn and pretzels.  The items listed above may not be a complete list of recommended foods or beverages. Contact your dietitian for more options.  WHAT FOODS ARE NOT RECOMMENDED?  Grains  White bread. White pasta. White rice. Refined cornbread. Bagels and croissants. Crackers that contain trans fat.  Vegetables  Creamed or fried vegetables. Vegetables in a cheese sauce. Regular canned vegetables. Regular canned tomato sauce and paste. Regular tomato and vegetable juices.  Fruits  Dried fruits. Canned fruit in light or heavy syrup. Fruit juice.  Meat and Other Protein   Products  Fatty cuts of meat. Ribs, chicken wings, bacon, sausage, bologna, salami, chitterlings, fatback, hot dogs, bratwurst, and packaged luncheon meats. Salted nuts and seeds. Canned beans with salt.  Dairy  Whole or 2% milk, cream, half-and-half, and cream cheese. Whole-fat or sweetened yogurt. Full-fat cheeses or blue cheese. Nondairy creamers and whipped toppings. Processed cheese, cheese spreads, or cheese  curds.  Condiments  Onion and garlic salt, seasoned salt, table salt, and sea salt. Canned and packaged gravies. Worcestershire sauce. Tartar sauce. Barbecue sauce. Teriyaki sauce. Soy sauce, including reduced sodium. Steak sauce. Fish sauce. Oyster sauce. Cocktail sauce. Horseradish. Ketchup and mustard. Meat flavorings and tenderizers. Bouillon cubes. Hot sauce. Tabasco sauce. Marinades. Taco seasonings. Relishes.  Fats and Oils  Butter, stick margarine, lard, shortening, ghee, and bacon fat. Coconut, palm kernel, or palm oils. Regular salad dressings.  Other  Pickles and olives. Salted popcorn and pretzels.  The items listed above may not be a complete list of foods and beverages to avoid. Contact your dietitian for more information.  WHERE CAN I FIND MORE INFORMATION?  National Heart, Lung, and Blood Institute: www.nhlbi.nih.gov/health/health-topics/topics/dash/     This information is not intended to replace advice given to you by your health care provider. Make sure you discuss any questions you have with your health care provider.     Document Released: 06/15/2011 Document Revised: 07/17/2014 Document Reviewed: 04/30/2013  Elsevier Interactive Patient Education ©2016 Elsevier Inc.

## 2016-08-18 DIAGNOSIS — I152 Hypertension secondary to endocrine disorders: Secondary | ICD-10-CM | POA: Insufficient documentation

## 2016-08-18 DIAGNOSIS — I10 Essential (primary) hypertension: Secondary | ICD-10-CM | POA: Insufficient documentation

## 2016-08-18 DIAGNOSIS — D500 Iron deficiency anemia secondary to blood loss (chronic): Secondary | ICD-10-CM | POA: Insufficient documentation

## 2016-08-23 ENCOUNTER — Other Ambulatory Visit: Payer: Self-pay | Admitting: Nurse Practitioner

## 2016-08-23 DIAGNOSIS — Z1231 Encounter for screening mammogram for malignant neoplasm of breast: Secondary | ICD-10-CM

## 2016-09-27 ENCOUNTER — Ambulatory Visit
Admission: RE | Admit: 2016-09-27 | Discharge: 2016-09-27 | Disposition: A | Discharge status: Home / Self Care | Payer: Medicaid Other | Source: Ambulatory Visit | Attending: Nurse Practitioner | Admitting: Nurse Practitioner

## 2016-09-27 DIAGNOSIS — Z1231 Encounter for screening mammogram for malignant neoplasm of breast: Secondary | ICD-10-CM

## 2017-11-27 ENCOUNTER — Encounter: Payer: Self-pay | Admitting: Physician Assistant

## 2017-11-27 ENCOUNTER — Ambulatory Visit (INDEPENDENT_AMBULATORY_CARE_PROVIDER_SITE_OTHER): Payer: 59 | Admitting: Physician Assistant

## 2017-11-27 ENCOUNTER — Other Ambulatory Visit: Payer: Self-pay

## 2017-11-27 VITALS — BP 122/74 | HR 74 | Temp 98.8°F | Resp 18 | Ht 66.34 in | Wt 218.2 lb

## 2017-11-27 DIAGNOSIS — M722 Plantar fascial fibromatosis: Secondary | ICD-10-CM | POA: Diagnosis not present

## 2017-11-27 DIAGNOSIS — D500 Iron deficiency anemia secondary to blood loss (chronic): Secondary | ICD-10-CM | POA: Diagnosis not present

## 2017-11-27 DIAGNOSIS — Z01419 Encounter for gynecological examination (general) (routine) without abnormal findings: Secondary | ICD-10-CM | POA: Diagnosis not present

## 2017-11-27 DIAGNOSIS — N898 Other specified noninflammatory disorders of vagina: Secondary | ICD-10-CM

## 2017-11-27 DIAGNOSIS — Z0000 Encounter for general adult medical examination without abnormal findings: Secondary | ICD-10-CM | POA: Diagnosis not present

## 2017-11-27 DIAGNOSIS — I10 Essential (primary) hypertension: Secondary | ICD-10-CM | POA: Diagnosis not present

## 2017-11-27 DIAGNOSIS — Z13220 Encounter for screening for lipoid disorders: Secondary | ICD-10-CM | POA: Diagnosis not present

## 2017-11-27 MED ORDER — ATENOLOL 50 MG PO TABS: 50.0000 mg | ORAL_TABLET | Freq: Every day | ORAL | 1 refills | Status: DC

## 2017-11-27 MED ORDER — HYDROCHLOROTHIAZIDE 12.5 MG PO TABS: 12.5000 mg | ORAL_TABLET | Freq: Every day | ORAL | 1 refills | Status: DC

## 2017-11-27 MED ORDER — FERROUS FUMARATE 325 (106 FE) MG PO TABS: 1.0000 | ORAL_TABLET | Freq: Every day | ORAL | 4 refills | Status: DC

## 2017-11-27 MED FILL — HYDROCHLOROTHIAZIDE 12.5 MG: 12.5 | 90 days supply | Qty: 90 | Fill #0

## 2017-11-27 MED FILL — ATENOLOL 50 MG TABLET: 50 | 90 days supply | Qty: 90 | Fill #0

## 2017-11-27 NOTE — Patient Instructions (Addendum)
Figure out who your colonoscopy was with so I can get the report and get you fixed up. Schedule mammogram. For foot - gel heel cups - and ice water bottle massage. Maybe think about semicustom shoe inserts - you can get them at [facility] on [address].  Super feet - tell them you are a [employer] employee  I will contact you with your lab results as soon as they are available.   If you have not heard from me in 2 weeks, please contact me.  The fastest way to get your results is to register for My Chart (see the instructions on this printout).  IF you received an x-ray today, you will receive an invoice from [facility]. Please contact [facility] at [phone] with questions or concerns regarding your invoice.   IF you received labwork today, you will receive an invoice from [facility]. Please contact [facility] at [phone] with questions or concerns regarding your invoice.   Our billing staff will not be able to assist you with questions regarding bills from these companies.  You will be contacted with the lab results as soon as they are available. The fastest way to get your results is to activate your My Chart account. Instructions are located on the last page of this paperwork. If you have not heard from [place] regarding the results in 2 weeks, please contact this office.

## 2017-11-27 NOTE — Progress Notes (Signed)
Christina Schwartz  MRN: [id number] DOB: [birth year]  PCP: Mancel Bale, PA-C   Chief Complaint  Patient presents with  . Annual Exam    with pap     Subjective:  Pt presents to clinic for a CPE.  Last dental exam: plans to go to every 6 months Last vision exam: wear glasses - 3 years -  Last pap: 2013 - normal Last mammo:  09/2016 - normal Last colonoscopy: about 5 years ago - she had stomach issues - she had polyps Vaccinations - UTD  Pt has plantar fasciitis - mainly in the left foot - wears tennis shoes to work - worse since starting work - she has used [employer] which helps some.  She has seen podiatry but she is worried because it has started to hurt worse since she is now on her feet all day.  Typical meals for patient: 2-3 meals with occasion snack   Breakfast - hashbrowns  Lunch - left over dinner - homecooked   Snack - cheese or nuts  Dinner - baked chicken or [place], veggies and starch Typical beverage choices: water Exercises: active job no outside exercise Sleeps: 8 hrs per night and sleeping well - wakes up rested    Patient Active Problem List   Diagnosis Date Noted  . Essential hypertension- controlled on medications 08/18/2016  . Iron deficiency anemia due to chronic blood loss - tolerates iron supplement good - this is the results of fibroids and heavy menses 08/18/2016  . Fibroids - cramping and heavy menses but they are now irregular 05/08/2012  . Menorrhagia 04/15/2012    Patient Care Team: Mittie Bodo as PCP - General (Physician Assistant)  Review of Systems  Constitutional: Negative.   HENT: Negative.   Eyes: Negative.   Respiratory: Negative.   Cardiovascular: Negative.   Gastrointestinal: Negative.   Endocrine: Negative.   Genitourinary: Negative.   Musculoskeletal: Negative.        Left foot pain  Skin: Negative.   Allergic/Immunologic: Negative.   Neurological: Negative.   Hematological: Negative.   Psychiatric/Behavioral:  Negative.      Current Outpatient Medications on File Prior to Visit  Medication Sig Dispense Refill  . Iron-Vitamins (GERITOL PO) Take 1 tablet by mouth daily.     No current facility-administered medications on file prior to visit.     No Known Allergies  Social History   Socioeconomic History  . Marital status: Legally Separated    Spouse name: bruce  . Number of children: 4  . Years of education: some college  . Highest education level: Not on file  Occupational History  . Occupation: Child psychotherapist: [employer]  . Financial resource strain: Not very hard  . Food insecurity:    Worry: Never true    Inability: Never true  . Transportation needs:    Medical: No    Non-medical: No  Tobacco Use  . Smoking status: Never Smoker  . Smokeless tobacco: Never Used  Substance and Sexual Activity  . Alcohol use: No  . Drug use: No  . Sexual activity: Yes    Partners: Male    Birth control/protection: None, Surgical    Comment: BTL  Lifestyle  . Physical activity:    Days per week: Not on file    Minutes per session: Not on file  . Stress: Not on file  Relationships  . Social connections:    Talks on phone: Not on  file    Gets together: Not on file    Attends religious service: Not on file    Active member of club or organization: Not on file    Attends meetings of clubs or organizations: Not on file    Relationship status: Not on file  Other Topics Concern  . Not on file  Social History Narrative   Lives with 4 children.      No gums in home.   Wears seatbelt.    Past Surgical History:  Procedure Laterality Date  . TUBAL LIGATION    . WISDOM TOOTH EXTRACTION      Family History  Problem Relation Age of Onset  . Hypertension Father   . Pulmonary embolism Father   . Diabetes Maternal Grandmother   . Diabetes Sister   . Lymphoma Sister      Objective:  BP 122/74   Pulse 74   Temp 98.8 F (37.1 C) (Oral)   Resp  18   Ht 5' 6.34" (1.685 m)   Wt 218 lb 3.2 oz (99 kg)   LMP  (LMP Unknown)   SpO2 97%   BMI 34.86 kg/m   Physical Exam  Constitutional: She is oriented to person, place, and time. She appears well-developed and well-nourished.  HENT:  Head: Normocephalic and atraumatic.  Right Ear: Hearing, tympanic membrane, external ear and ear canal normal.  Left Ear: Hearing, tympanic membrane, external ear and ear canal normal.  Nose: Nose normal.  Mouth/Throat: Uvula is midline, oropharynx is clear and moist and mucous membranes are normal.  Eyes: Pupils are equal, round, and reactive to light. Conjunctivae, EOM and lids are normal. Right eye exhibits no discharge. Left eye exhibits no discharge.  Neck: Trachea normal and normal range of motion. Neck supple. No thyroid mass and no thyromegaly present.  Cardiovascular: Normal rate and regular rhythm.  Murmur heard.  Systolic murmur is present with a grade of 2/6. Pulmonary/Chest: Effort normal and breath sounds normal. She has no wheezes. Right breast exhibits no inverted nipple, no mass, no nipple discharge, no skin change and no tenderness. Left breast exhibits no inverted nipple, no mass, no nipple discharge, no skin change and no tenderness.  Abdominal: Soft. Normal appearance and bowel sounds are normal. There is no tenderness.  Genitourinary: Pelvic exam was performed with patient supine. No labial fusion. There is no rash, tenderness, lesion or injury on the right labia. There is no rash, tenderness, lesion or injury on the left labia. Uterus is enlarged. Cervix exhibits no motion tenderness, no discharge and no friability. Right adnexum displays no mass, no tenderness and no fullness. Left adnexum displays no mass, no tenderness and no fullness. Vaginal discharge (thin white) found.  Musculoskeletal: Normal range of motion.  Pes planus - inversion on left ankle with walking that worsens without shoes on  Lymphadenopathy:       Head (right  side): No tonsillar, no preauricular, no posterior auricular and no occipital adenopathy present.       Head (left side): No tonsillar, no preauricular, no posterior auricular and no occipital adenopathy present.    She has no cervical adenopathy.       Right: No supraclavicular adenopathy present.       Left: No supraclavicular adenopathy present.  Neurological: She is alert and oriented to person, place, and time. She has normal strength and normal reflexes.  Skin: Skin is warm, dry and intact.  Multiple skin tags on face and neck  Psychiatric: She has  a normal mood and affect. Her speech is normal and behavior is normal. Judgment and thought content normal.  Vitals reviewed.   Wt Readings from Last 3 Encounters:  11/27/17 218 lb 3.2 oz (99 kg)  07/22/15 206 lb (93.4 kg)  05/08/12 194 lb (88 kg)     Visual Acuity Screening   Right eye Left eye Both eyes  Without correction:     With correction: _0   Rhythm: sinus rhythm at a rate of 56 (on atenolol). Findings: sinus bradycardia  Last EKG: none   I have personally reviewed the EKG tracing and agree with the computerized printout.  Assessment and Plan :  Annual physical exam -anticipatory guidance given  Encounter for gynecological examination without abnormal finding - Plan: Pap IG and HPV (high risk) DNA detection -check labs  Vaginal discharge - Plan: WET PREP FOR [place], [place] -check labs treat if indicated  Iron deficiency anemia due to chronic blood loss - Plan: CBC with Differential/Platelet, CMP14+EGFR, Iron, TIBC and Ferritin Panel, ferrous fumarate (HEMOCYTE - 106 MG FE) 325 (106 Fe) MG TABS tablet -this is long-standing for patient will check labs to make sure she is being adequately dosed with iron  Essential hypertension - Plan: Hemoglobin A1c, Lipid panel, TSH, EKG 12-Lead, hydrochlorothiazide (HYDRODIURIL) 12.5 MG tablet, atenolol (TENORMIN) 50 MG tablet -well-controlled continue above  medications  Screening cholesterol level - Plan: Lipid panel -check labs  Plantar fasciitis -suggested gel heel cups and ice water bottle massages.  Also suggested going to get semicustom shoe orthotics especially since she is on her feet all day.  Patient verbalized to me that they understood what their diagnoses are, what they need to do about them and why it is important that they do them.  See after visit summary for patient specific instructions.   Windell Hummingbird PA-C  [facility] 11/27/2017 12:14 PM

## 2017-11-28 LAB — CMP14+EGFR
A/G RATIO: 1.4 (ref 1.2–2.2)
ALBUMIN: 4.3 g/dL (ref 3.5–5.5)
ALT: 41 IU/L — ABNORMAL HIGH (ref 0–32)
AST: 31 IU/L (ref 0–40)
Alkaline Phosphatase: 81 IU/L (ref 39–117)
BILIRUBIN TOTAL: 0.5 mg/dL (ref 0.0–1.2)
BUN / CREAT RATIO: 18 (ref 9–23)
BUN: 14 mg/dL (ref 6–24)
CALCIUM: 9.8 mg/dL (ref 8.7–10.2)
CHLORIDE: 100 mmol/L (ref 96–106)
CO2: 26 mmol/L (ref 20–29)
Creatinine, Ser: 0.8 mg/dL (ref 0.57–1.00)
GFR, EST AFRICAN AMERICAN: 102 mL/min/{1.73_m2} (ref >59)
GFR, EST NON AFRICAN AMERICAN: 89 mL/min/{1.73_m2} (ref >59)
GLUCOSE: 93 mg/dL (ref 65–99)
Globulin, Total: 3.1 g/dL (ref 1.5–4.5)
Potassium: 4.2 mmol/L (ref 3.5–5.2)
Sodium: 142 mmol/L (ref 134–144)
TOTAL PROTEIN: 7.4 g/dL (ref 6.0–8.5)

## 2017-11-28 LAB — CBC WITH DIFFERENTIAL/PLATELET
BASOS: 0 %
Basophils Absolute: 0 10*3/uL (ref 0.0–0.2)
EOS (ABSOLUTE): 0.2 10*3/uL (ref 0.0–0.4)
Eos: 3 %
HEMOGLOBIN: 13.3 g/dL (ref 11.1–15.9)
Hematocrit: 43 % (ref 34.0–46.6)
IMMATURE GRANS (ABS): 0 10*3/uL (ref 0.0–0.1)
IMMATURE GRANULOCYTES: 0 %
LYMPHS: 38 %
Lymphocytes Absolute: 2 10*3/uL (ref 0.7–3.1)
MCH: 23.8 pg — AB (ref 26.6–33.0)
MCHC: 30.9 g/dL — ABNORMAL LOW (ref 31.5–35.7)
MCV: 77 fL — AB (ref 79–97)
Monocytes Absolute: 0.3 10*3/uL (ref 0.1–0.9)
Monocytes: 6 %
NEUTROS ABS: 2.8 10*3/uL (ref 1.4–7.0)
Neutrophils: 53 %
Platelets: 317 10*3/uL (ref 150–450)
RBC: 5.59 x10E6/uL — ABNORMAL HIGH (ref 3.77–5.28)
RDW: 17.3 % — ABNORMAL HIGH (ref 12.3–15.4)
WBC: 5.3 10*3/uL (ref 3.4–10.8)

## 2017-11-28 LAB — IRON,TIBC AND FERRITIN PANEL
FERRITIN: 40 ng/mL (ref 15–150)
IRON SATURATION: 14 % — AB (ref 15–55)
IRON: 48 ug/dL (ref 27–159)
TIBC: 349 ug/dL (ref 250–450)
UIBC: 301 ug/dL (ref 131–425)

## 2017-11-28 LAB — HEMOGLOBIN A1C
Est. average glucose Bld gHb Est-mCnc: 146 mg/dL
Hgb A1c MFr Bld: 6.7 % — ABNORMAL HIGH (ref 4.8–5.6)

## 2017-11-28 LAB — LIPID PANEL
CHOLESTEROL TOTAL: 165 mg/dL (ref 100–199)
Chol/HDL Ratio: 3.8 ratio (ref 0.0–4.4)
HDL: 44 mg/dL (ref >39)
LDL Calculated: 77 mg/dL (ref 0–99)
Triglycerides: 222 mg/dL — ABNORMAL HIGH (ref 0–149)
VLDL CHOLESTEROL CAL: 44 mg/dL — AB (ref 5–40)

## 2017-11-28 LAB — WET PREP FOR TRICH, YEAST, CLUE
CLUE CELL EXAM: NEGATIVE
TRICHOMONAS EXAM: NEGATIVE
YEAST EXAM: NEGATIVE

## 2017-11-28 LAB — TSH: TSH: 0.849 u[IU]/mL (ref 0.450–4.500)

## 2017-11-29 LAB — PAP IG AND HPV HIGH-RISK
HPV, high-risk: NEGATIVE
PAP SMEAR COMMENT: 0

## 2017-12-05 ENCOUNTER — Encounter: Payer: Self-pay | Admitting: Physician Assistant

## 2017-12-05 DIAGNOSIS — H52223 Regular astigmatism, bilateral: Secondary | ICD-10-CM | POA: Diagnosis not present

## 2017-12-06 MED ORDER — METFORMIN HCL ER 500 MG PO TB24: 500.0000 mg | ORAL_TABLET | Freq: Every day | ORAL | 0 refills | Status: DC

## 2017-12-06 MED FILL — METFORMIN HCL ER 500 MG TAB: 500 | 90 days supply | Qty: 90 | Fill #0

## 2017-12-28 ENCOUNTER — Encounter: Payer: Self-pay | Admitting: Physician Assistant

## 2018-02-05 ENCOUNTER — Encounter: Payer: Self-pay | Admitting: Physician Assistant

## 2018-02-05 DIAGNOSIS — I10 Essential (primary) hypertension: Secondary | ICD-10-CM

## 2018-02-09 MED ORDER — HYDROCHLOROTHIAZIDE 12.5 MG PO TABS: 12.5000 mg | ORAL_TABLET | Freq: Two times a day (BID) | ORAL | 0 refills | Status: DC

## 2018-02-11 MED FILL — HYDROCHLOROTHIAZIDE 12.5 MG: 12.5 | 90 days supply | Qty: 180 | Fill #0

## 2018-03-01 ENCOUNTER — Telehealth: Payer: Self-pay | Admitting: Physician Assistant

## 2018-03-01 NOTE — Telephone Encounter (Signed)
Left a VM in regards to her appt she has with Windell Hummingbird on 05/31/2018. The provider is leaving our office and needs to be rescheduled/cancelled.

## 2018-03-18 ENCOUNTER — Other Ambulatory Visit: Payer: Self-pay | Admitting: Physician Assistant

## 2018-03-18 MED ORDER — METFORMIN HCL ER 500 MG PO TB24: 500.0000 mg | ORAL_TABLET | Freq: Every day | ORAL | 0 refills | Status: DC

## 2018-03-18 NOTE — Telephone Encounter (Signed)
Rx refill request: metformin 500 mg          (sig and quantity need to be up dated- see last lab- patient is taking 2/day)  LOV: 11/27/17  PCP: Gale Journey- patient has appointment with Nolon Rod ( 05/31/18)  Pharmacy: verified

## 2018-03-18 NOTE — Telephone Encounter (Signed)
Copied from Burke [phone]. Topic: Quick Communication - Rx Refill/Question >> Mar 18, 2018 11:45 AM Reyne Dumas L wrote: Medication: metFORMIN (GLUCOPHAGE XR) 500 MG 24 hr tablet  Has the patient contacted their pharmacy? Yes - pt was given a 90 day supply then provider upped RX to 2x daily and now she needs more (Agent: If no, request that the patient contact the pharmacy for the refill.) (Agent: If yes, when and what did the pharmacy advise?)  Preferred Pharmacy (with phone number or street name): [place], [place] - [address]. [phone] (Phone) [phone] (Fax)  Agent: Please be advised that RX refills may take up to 3 business days. We ask that you follow-up with your pharmacy.

## 2018-03-19 MED FILL — METFORMIN HCL ER 500 MG TAB: 500 | 90 days supply | Qty: 90 | Fill #0

## 2018-04-15 ENCOUNTER — Other Ambulatory Visit: Payer: Self-pay | Admitting: Family Medicine

## 2018-04-15 DIAGNOSIS — Z1231 Encounter for screening mammogram for malignant neoplasm of breast: Secondary | ICD-10-CM

## 2018-05-08 MED FILL — AMOXICILLIN 500 MG CAPSULE: 500 | 7 days supply | Qty: 21 | Fill #0

## 2018-05-20 ENCOUNTER — Ambulatory Visit
Admission: RE | Admit: 2018-05-20 | Discharge: 2018-05-20 | Disposition: A | Discharge status: Home / Self Care | Payer: 59 | Source: Ambulatory Visit | Attending: Family Medicine | Admitting: Family Medicine

## 2018-05-20 DIAGNOSIS — Z1231 Encounter for screening mammogram for malignant neoplasm of breast: Secondary | ICD-10-CM

## 2018-05-31 ENCOUNTER — Other Ambulatory Visit: Payer: Self-pay

## 2018-05-31 ENCOUNTER — Encounter: Payer: Self-pay | Admitting: Family Medicine

## 2018-05-31 ENCOUNTER — Ambulatory Visit (INDEPENDENT_AMBULATORY_CARE_PROVIDER_SITE_OTHER): Payer: 59 | Admitting: Family Medicine

## 2018-05-31 ENCOUNTER — Ambulatory Visit: Payer: 59 | Admitting: Physician Assistant

## 2018-05-31 VITALS — BP 142/84 | HR 62 | Temp 98.2°F | Resp 18 | Ht 67.13 in | Wt 210.4 lb

## 2018-05-31 DIAGNOSIS — D500 Iron deficiency anemia secondary to blood loss (chronic): Secondary | ICD-10-CM

## 2018-05-31 DIAGNOSIS — E781 Pure hyperglyceridemia: Secondary | ICD-10-CM

## 2018-05-31 DIAGNOSIS — E119 Type 2 diabetes mellitus without complications: Secondary | ICD-10-CM

## 2018-05-31 DIAGNOSIS — Z23 Encounter for immunization: Secondary | ICD-10-CM | POA: Diagnosis not present

## 2018-05-31 DIAGNOSIS — R202 Paresthesia of skin: Secondary | ICD-10-CM

## 2018-05-31 DIAGNOSIS — D508 Other iron deficiency anemias: Secondary | ICD-10-CM | POA: Diagnosis not present

## 2018-05-31 DIAGNOSIS — R200 Anesthesia of skin: Secondary | ICD-10-CM | POA: Diagnosis not present

## 2018-05-31 DIAGNOSIS — I10 Essential (primary) hypertension: Secondary | ICD-10-CM

## 2018-05-31 LAB — POCT GLYCOSYLATED HEMOGLOBIN (HGB A1C): Hemoglobin A1C: 6.6 % — AB (ref 4.0–5.6)

## 2018-05-31 MED ORDER — ATENOLOL 50 MG PO TABS: 50.0000 mg | ORAL_TABLET | Freq: Every day | ORAL | 1 refills | Status: DC

## 2018-05-31 MED ORDER — METFORMIN HCL ER 500 MG PO TB24: 500.0000 mg | ORAL_TABLET | Freq: Every day | ORAL | 0 refills | Status: DC

## 2018-05-31 MED ORDER — HYDROCHLOROTHIAZIDE 12.5 MG PO TABS: 12.5000 mg | ORAL_TABLET | Freq: Two times a day (BID) | ORAL | 1 refills | Status: DC

## 2018-05-31 NOTE — Patient Instructions (Addendum)
Wt Readings from Last 3 Encounters:  05/31/18 210 lb 6.4 oz (95.4 kg)  11/27/17 218 lb 3.2 oz (99 kg)  07/22/15 206 lb (93.4 kg)       If you have lab work done today you will be contacted with your lab results within the next 2 weeks.  If you have not heard from [place] then please contact us. The fastest way to get your results is to register for My Chart.   IF you received an x-ray today, you will receive an invoice from [facility]. Please contact [facility] at [phone] with questions or concerns regarding your invoice.   IF you received labwork today, you will receive an invoice from [facility]. Please contact [facility] at [phone] with questions or concerns regarding your invoice.   Our billing staff will not be able to assist you with questions regarding bills from these companies.  You will be contacted with the lab results as soon as they are available. The fastest way to get your results is to activate your My Chart account. Instructions are located on the last page of this paperwork. If you have not heard from [place] regarding the results in 2 weeks, please contact this office.    Pneumococcal Polysaccharide Vaccine: What You Need to Know 1. Why get vaccinated? Vaccination can protect older adults (and some children and younger adults) from pneumococcal disease. Pneumococcal disease is caused by bacteria that can spread from person to person through close contact. It can cause ear infections, and it can also lead to more serious infections of the:  Lungs (pneumonia),  Blood (bacteremia), and  Covering of the brain and spinal cord (meningitis). Meningitis can cause deafness and brain damage, and it can be fatal.  Anyone can get pneumococcal disease, but children under 66 years of age, people with certain medical conditions, adults over 2 years of age, and cigarette smokers are at the highest risk. About 18,000 older adults die each year from pneumococcal  disease in the [place]. Treatment of pneumococcal infections with penicillin and other drugs used to be more effective. But some strains of the disease have become resistant to these drugs. This makes prevention of the disease, through vaccination, even more important. 2. Pneumococcal polysaccharide vaccine (PPSV23) Pneumococcal polysaccharide vaccine (PPSV23) protects against 23 types of pneumococcal bacteria. It will not prevent all pneumococcal disease. PPSV23 is recommended for:  All adults 33 years of age and older,  Anyone 2 through 47 years of age with certain long-term health problems,  Anyone 2 through 47 years of age with a weakened immune system,  Adults 76 through 47 years of age who smoke cigarettes or have asthma.  Most people need only one dose of PPSV. A second dose is recommended for certain high-risk groups. People 29 and older should get a dose even if they have gotten one or more doses of the vaccine before they turned 65. Your healthcare provider can give you more information about these recommendations. Most healthy adults develop protection within 2 to 3 weeks of getting the shot. 3. Some people should not get this vaccine  Anyone who has had a life-threatening allergic reaction to PPSV should not get another dose.  Anyone who has a severe allergy to any component of PPSV should not receive it. Tell your provider if you have any severe allergies.  Anyone who is moderately or severely ill when the shot is scheduled may be asked to wait until they recover before getting the vaccine. Someone  with a mild illness can usually be vaccinated.  Children less than 53 years of age should not receive this vaccine.  There is no evidence that PPSV is harmful to either a pregnant woman or to her fetus. However, as a precaution, women who need the vaccine should be vaccinated before becoming pregnant, if possible. 4. Risks of a vaccine reaction With any medicine, including  vaccines, there is a chance of side effects. These are usually mild and go away on their own, but serious reactions are also possible. About half of people who get PPSV have mild side effects, such as redness or pain where the shot is given, which go away within about two days. Less than 1 out of 100 people develop a fever, muscle aches, or more severe local reactions. Problems that could happen after any vaccine:  People sometimes faint after a medical procedure, including vaccination. Sitting or lying down for about 15 minutes can help prevent fainting, and injuries caused by a fall. Tell your doctor if you feel dizzy, or have vision changes or ringing in the ears.  Some people get severe pain in the shoulder and have difficulty moving the arm where a shot was given. This happens very rarely.  Any medication can cause a severe allergic reaction. Such reactions from a vaccine are very rare, estimated at about 1 in a million doses, and would happen within a few minutes to a few hours after the vaccination. As with any medicine, there is a very remote chance of a vaccine causing a serious injury or death. The safety of vaccines is always being monitored. For more information, visit: http://www.aguilar.org/ 5. What if there is a serious reaction? What should I look for? Look for anything that concerns you, such as signs of a severe allergic reaction, very high fever, or unusual behavior. Signs of a severe allergic reaction can include hives, swelling of the face and throat, difficulty breathing, a fast heartbeat, dizziness, and weakness. These would usually start a few minutes to a few hours after the vaccination. What should I do? If you think it is a severe allergic reaction or other emergency that can't wait, call 9-1-1 or get to the nearest hospital. Otherwise, call your doctor. Afterward, the reaction should be reported to the Vaccine Adverse Event Reporting System (VAERS). Your doctor might  file this report, or you can do it yourself through the VAERS web site at www.vaers.SamedayNews.es, or by calling 406-631-4033. VAERS does not give medical advice. 6. How can I learn more?  Ask your doctor. He or she can give you the vaccine package insert or suggest other sources of information.  Call your local or state health department.  Contact the Centers for Disease Control and Prevention (CDC): ? Call 407-864-4006 (1-800-CDC-INFO) or ? Visit CDC's website at http://hunter.com/ CDC Pneumococcal Polysaccharide Vaccine VIS (10/31/13) This information is not intended to replace advice given to you by your health care provider. Make sure you discuss any questions you have with your health care provider. Document Released: 04/23/2006 Document Revised: 03/16/2016 Document Reviewed: 03/16/2016 Elsevier Interactive Patient Education  2017 [employer].

## 2018-05-31 NOTE — Progress Notes (Addendum)
Established Patient Office Visit  Subjective:  Patient ID: Christina Schwartz, female    DOB: 1971-01-18  Age: 47 y.o. MRN: [id number]  CC:  Chief Complaint  Patient presents with  . Hypertension    f/u  . Medication Refill    tenormin, hydrodiuril, and metformin    HPI Christina Schwartz presents for  Hypertension: Patient here for follow-up of elevated blood pressure. She is not exercising and is adherent to low salt diet.  Blood pressure is well controlled at home. Cardiac symptoms none. Patient denies chest pain, chest pressure/discomfort, claudication, dyspnea and exertional chest pressure/discomfort.  Cardiovascular risk factors: hypertension. Use of agents associated with hypertension: none. History of target organ damage: none. BP Readings from Last 3 Encounters:  05/31/18 (!) 142/84  11/27/17 122/74  07/23/15 (!) 175/110    Anemia She reports that she gets dizziness in the morning Denies fatigue She reports that she is still taking the ferrous fumarate She states that she eats iron rich foods No heavy or unusal bleeding  She has irregular menses without prolonged heavy bleeding Lab Results  Component Value Date   WBC 5.3 11/27/2017   HGB 13.3 11/27/2017   HCT 43.0 11/27/2017   MCV 77 (L) 11/27/2017   PLT 317 11/27/2017   Diabetes Mellitus: Patient presents for follow up of diabetes.  Based on her a1c of 6.7% 11/27/2017.  Symptoms: paresthesia of the feet. Symptoms have been intermittent. Patient denies hyperglycemia, hypoglycemia , increase appetite, nausea and polydipsia.  Evaluation to date has been included: hemoglobin A1C.  Home sugars: patient does not check sugars. Treatment to date: Continued metformin which has been effective.  Lab Results  Component Value Date   HGBA1C 6.6 (A) 05/31/2018   Hypertriglyceridemia Lab Results  Component Value Date   CHOL 147 05/31/2018   CHOL 165 11/27/2017   Lab Results  Component Value Date   HDL 41 05/31/2018   HDL 44  11/27/2017   Lab Results  Component Value Date   LDLCALC 73 05/31/2018   LDLCALC 77 11/27/2017   Lab Results  Component Value Date   TRIG 165 (H) 05/31/2018   TRIG 222 (H) 11/27/2017   Lab Results  Component Value Date   CHOLHDL 3.6 05/31/2018   CHOLHDL 3.8 11/27/2017   No results found for: LDLDIRECT  She reports that she has been eating healthier and losing weight.    Past Medical History:  Diagnosis Date  . Anemia   . Heart murmur   . Hypertension     Past Surgical History:  Procedure Laterality Date  . TUBAL LIGATION    . WISDOM TOOTH EXTRACTION      Family History  Problem Relation Age of Onset  . Hypertension Father   . Pulmonary embolism Father   . Diabetes Maternal Grandmother   . Diabetes Sister   . Lymphoma Sister     Social History   Socioeconomic History  . Marital status: Legally Separated    Spouse name: bruce  . Number of children: 4  . Years of education: some college  . Highest education level: Not on file  Occupational History  . Occupation: Child psychotherapist: [employer]  . Financial resource strain: Not very hard  . Food insecurity:    Worry: Never true    Inability: Never true  . Transportation needs:    Medical: No    Non-medical: No  Tobacco Use  . Smoking status: Never Smoker  . Smokeless  tobacco: Never Used  Substance and Sexual Activity  . Alcohol use: No  . Drug use: No  . Sexual activity: Yes    Partners: Male    Birth control/protection: None, Surgical    Comment: BTL  Lifestyle  . Physical activity:    Days per week: Not on file    Minutes per session: Not on file  . Stress: Not on file  Relationships  . Social connections:    Talks on phone: Not on file    Gets together: Not on file    Attends religious service: Not on file    Active member of club or organization: Not on file    Attends meetings of clubs or organizations: Not on file    Relationship status: Not on file    . Intimate partner violence:    Fear of current or ex partner: Not on file    Emotionally abused: Not on file    Physically abused: Not on file    Forced sexual activity: Not on file  Other Topics Concern  . Not on file  Social History Narrative   Lives with 4 children.      No gums in home.   Wears seatbelt.    Outpatient Medications Prior to Visit  Medication Sig Dispense Refill  . ferrous fumarate (HEMOCYTE - 106 MG FE) 325 (106 Fe) MG TABS tablet Take 1 tablet (106 mg of iron total) by mouth daily. 90 each 4  . Iron-Vitamins (GERITOL PO) Take 1 tablet by mouth daily.    Marland Kitchen atenolol (TENORMIN) 50 MG tablet Take 1 tablet (50 mg total) by mouth daily. 90 tablet 1  . hydrochlorothiazide (HYDRODIURIL) 12.5 MG tablet Take 1 tablet (12.5 mg total) by mouth 2 (two) times daily. 180 tablet 0  . metFORMIN (GLUCOPHAGE XR) 500 MG 24 hr tablet Take 1 tablet (500 mg total) by mouth daily with breakfast. 180 tablet 0   No facility-administered medications prior to visit.     No Known Allergies  ROS Review of Systems    Objective:     BP (!) 146/87   Pulse 62   Temp 98.2 F (36.8 C) (Oral)   Resp 18   Ht 5' 7.13" (1.705 m)   Wt 210 lb 6.4 oz (95.4 kg)   LMP 04/15/2018   SpO2 96%   BMI 32.83 kg/m  Wt Readings from Last 3 Encounters:  05/31/18 210 lb 6.4 oz (95.4 kg)  11/27/17 218 lb 3.2 oz (99 kg)  07/22/15 206 lb (93.4 kg)      Physical Exam  Physical Exam  Constitutional: Oriented to person, place, and time. Appears well-developed and well-nourished.  HENT:  Head: Normocephalic and atraumatic.  Eyes: Conjunctivae and EOM are normal.  Cardiovascular: Normal rate, regular rhythm, normal heart sounds and intact distal pulses.  No murmur heard. Pulmonary/Chest: Effort normal and breath sounds normal. No stridor. No respiratory distress. Has no wheezes.  Abdomen: non-distended, normoactive bs, soft, nontender Neurological: Is alert and oriented to person, place, and  time.  Skin: Skin is warm. Capillary refill takes less than 2 seconds.  Psychiatric: Has a normal mood and affect. Behavior is normal. Judgment and thought content normal.    Health Maintenance Due  Topic Date Due  . FOOT EXAM  01/26/1981  . OPHTHALMOLOGY EXAM  01/26/1981  . URINE MICROALBUMIN  01/26/1981  . HIV Screening  01/26/1986    There are no preventive care reminders to display for this patient.  Lab Results  Component Value Date   TSH 0.849 11/27/2017   Lab Results  Component Value Date   WBC 5.3 11/27/2017   HGB 13.3 11/27/2017   HCT 43.0 11/27/2017   MCV 77 (L) 11/27/2017   PLT 317 11/27/2017   Lab Results  Component Value Date   NA 144 05/31/2018   K 3.8 05/31/2018   CO2 26 05/31/2018   GLUCOSE 102 (H) 05/31/2018   BUN 22 05/31/2018   CREATININE 0.84 05/31/2018   BILITOT 0.6 05/31/2018   ALKPHOS 71 05/31/2018   AST 23 05/31/2018   ALT 26 05/31/2018   PROT 7.6 05/31/2018   ALBUMIN 4.5 05/31/2018   CALCIUM 10.1 05/31/2018   ANIONGAP 12 07/22/2015   Lab Results  Component Value Date   CHOL 147 05/31/2018   Lab Results  Component Value Date   HDL 41 05/31/2018   Lab Results  Component Value Date   LDLCALC 73 05/31/2018   Lab Results  Component Value Date   TRIG 165 (H) 05/31/2018   Lab Results  Component Value Date   CHOLHDL 3.6 05/31/2018   Lab Results  Component Value Date   HGBA1C 6.6 (A) 05/31/2018      Assessment & Plan:   Problem List Items Addressed This Visit      Cardiovascular and Mediastinum   Essential hypertension    Patient's blood pressure is not at goal of 130/80 or less. Condition is stable.  Will plan on increasing the bp medication if patient is not able to improve this with lifestyle. Would recommend a low dose lisinopril 5mg  at next visit.   Continue current medications and treatment plan. I recommend that you exercise for 30-45 minutes 5 days a week. I also recommend a balanced diet with fruits and vegetables  every day, lean meats, and little fried foods. The DASH diet (you can find this online) is a good example of this.       Relevant Medications   atenolol (TENORMIN) 50 MG tablet   hydrochlorothiazide (HYDRODIURIL) 12.5 MG tablet   Other Relevant Orders   Lipid panel (Completed)   Comprehensive metabolic panel (Completed)     Endocrine   Type 2 diabetes mellitus without complication, without long-term current use of insulin ([place]) - Primary    well controlled hemoglobin a1c is at goal Continue exercise Lipids monitored and renal function in range On metformin Reviewed diabetic foot care Emphasized importance of eye and dental exam   Will add an ace inhibitor and aspirin at next visit       Relevant Medications   metFORMIN (GLUCOPHAGE XR) 500 MG 24 hr tablet   Other Relevant Orders   POCT glycosylated hemoglobin (Hb A1C) (Completed)     Other   Iron deficiency anemia due to chronic blood loss    Hemoglobin stable on last check, continue iron        Hypertriglyceridemia    Discuss statins in diabetics and goals for heart disease prevention. TG close of goal of 150 or less Pt is working on it with lifestyle modification      Relevant Medications   atenolol (TENORMIN) 50 MG tablet   hydrochlorothiazide (HYDRODIURIL) 12.5 MG tablet   Other Relevant Orders   Lipid panel (Completed)    Other Visit Diagnoses    Iron deficiency anemia secondary to inadequate dietary iron intake       Relevant Orders   Iron, TIBC and Ferritin Panel (Completed)   Need for prophylactic vaccination against Streptococcus pneumoniae (pneumococcus)  Relevant Orders   Pneumococcal polysaccharide vaccine 23-valent greater than or equal to 2yo subcutaneous/IM (Completed)   Numbness and tingling       Relevant Orders   Vitamin B12 (Completed)      Meds ordered this encounter  Medications  . atenolol (TENORMIN) 50 MG tablet    Sig: Take 1 tablet (50 mg total) by mouth daily.    Dispense:   90 tablet    Refill:  1  . metFORMIN (GLUCOPHAGE XR) 500 MG 24 hr tablet    Sig: Take 1 tablet (500 mg total) by mouth daily with breakfast.    Dispense:  180 tablet    Refill:  0  . hydrochlorothiazide (HYDRODIURIL) 12.5 MG tablet    Sig: Take 1 tablet (12.5 mg total) by mouth 2 (two) times daily.    Dispense:  180 tablet    Refill:  1    Follow-up: Return in about 6 months (around 11/29/2018) for diabetes .    Forrest Moron, MD

## 2018-06-01 LAB — COMPREHENSIVE METABOLIC PANEL
A/G RATIO: 1.5 (ref 1.2–2.2)
ALBUMIN: 4.5 g/dL (ref 3.5–5.5)
ALT: 26 IU/L (ref 0–32)
AST: 23 IU/L (ref 0–40)
Alkaline Phosphatase: 71 IU/L (ref 39–117)
BILIRUBIN TOTAL: 0.6 mg/dL (ref 0.0–1.2)
BUN / CREAT RATIO: 26 — AB (ref 9–23)
BUN: 22 mg/dL (ref 6–24)
CHLORIDE: 100 mmol/L (ref 96–106)
CO2: 26 mmol/L (ref 20–29)
Calcium: 10.1 mg/dL (ref 8.7–10.2)
Creatinine, Ser: 0.84 mg/dL (ref 0.57–1.00)
GFR calc non Af Amer: 83 mL/min/{1.73_m2} (ref >59)
GFR, EST AFRICAN AMERICAN: 96 mL/min/{1.73_m2} (ref >59)
Globulin, Total: 3.1 g/dL (ref 1.5–4.5)
Glucose: 102 mg/dL — ABNORMAL HIGH (ref 65–99)
POTASSIUM: 3.8 mmol/L (ref 3.5–5.2)
Sodium: 144 mmol/L (ref 134–144)
Total Protein: 7.6 g/dL (ref 6.0–8.5)

## 2018-06-01 LAB — IRON,TIBC AND FERRITIN PANEL
Ferritin: 24 ng/mL (ref 15–150)
Iron Saturation: 12 % — ABNORMAL LOW (ref 15–55)
Iron: 45 ug/dL (ref 27–159)
Total Iron Binding Capacity: 378 ug/dL (ref 250–450)
UIBC: 333 ug/dL (ref 131–425)

## 2018-06-01 LAB — LIPID PANEL
CHOL/HDL RATIO: 3.6 ratio (ref 0.0–4.4)
Cholesterol, Total: 147 mg/dL (ref 100–199)
HDL: 41 mg/dL (ref >39)
LDL Calculated: 73 mg/dL (ref 0–99)
TRIGLYCERIDES: 165 mg/dL — AB (ref 0–149)
VLDL Cholesterol Cal: 33 mg/dL (ref 5–40)

## 2018-06-01 LAB — VITAMIN B12: Vitamin B-12: 852 pg/mL (ref 232–1245)

## 2018-06-09 DIAGNOSIS — E781 Pure hyperglyceridemia: Secondary | ICD-10-CM | POA: Insufficient documentation

## 2018-06-09 DIAGNOSIS — E785 Hyperlipidemia, unspecified: Secondary | ICD-10-CM | POA: Insufficient documentation

## 2018-06-09 DIAGNOSIS — E1169 Type 2 diabetes mellitus with other specified complication: Secondary | ICD-10-CM | POA: Insufficient documentation

## 2018-06-09 DIAGNOSIS — E119 Type 2 diabetes mellitus without complications: Secondary | ICD-10-CM | POA: Insufficient documentation

## 2018-06-09 NOTE — Assessment & Plan Note (Signed)
Discuss statins in diabetics and goals for heart disease prevention. TG close of goal of 150 or less Pt is working on it with lifestyle modification

## 2018-06-09 NOTE — Assessment & Plan Note (Signed)
Patient's blood pressure is not at goal of 130/80 or less. Condition is stable.  Will plan on increasing the bp medication if patient is not able to improve this with lifestyle. Would recommend a low dose lisinopril 5mg  at next visit.   Continue current medications and treatment plan. I recommend that you exercise for 30-45 minutes 5 days a week. I also recommend a balanced diet with fruits and vegetables every day, lean meats, and little fried foods. The DASH diet (you can find this online) is a good example of this.

## 2018-06-09 NOTE — Assessment & Plan Note (Signed)
Hemoglobin stable on last check, continue iron

## 2018-06-09 NOTE — Assessment & Plan Note (Signed)
well controlled hemoglobin a1c is at goal Continue exercise Lipids monitored and renal function in range On metformin Reviewed diabetic foot care Emphasized importance of eye and dental exam   Will add an ace inhibitor and aspirin at next visit

## 2018-06-12 MED FILL — ATENOLOL 50 MG TABLET: 50 | 90 days supply | Qty: 90 | Fill #0

## 2018-06-12 MED FILL — HYDROCHLOROTHIAZIDE 12.5 MG: 12.5 | 90 days supply | Qty: 180 | Fill #0

## 2018-07-09 ENCOUNTER — Ambulatory Visit (INDEPENDENT_AMBULATORY_CARE_PROVIDER_SITE_OTHER): Payer: 59

## 2018-07-09 ENCOUNTER — Encounter: Payer: Self-pay | Admitting: Podiatry

## 2018-07-09 ENCOUNTER — Ambulatory Visit (INDEPENDENT_AMBULATORY_CARE_PROVIDER_SITE_OTHER): Payer: 59 | Admitting: Podiatry

## 2018-07-09 VITALS — BP 159/95 | HR 60 | Resp 16

## 2018-07-09 DIAGNOSIS — M722 Plantar fascial fibromatosis: Secondary | ICD-10-CM

## 2018-07-09 NOTE — Patient Instructions (Signed)

## 2018-07-09 NOTE — Progress Notes (Signed)
  Subjective:  Patient ID: Christina Schwartz, female    DOB: 02/24/1971,  MRN: [id number] HPI Chief Complaint  Patient presents with  . Foot Pain    Plantar heel bilateral - aching x months, AM pain, history of PF, wore inserts 10 years ago, but didn't really help, ices occasionally  . Diabetes    Requesting diabetic foot exam, last a1c was 6.6  . New Patient (Initial Visit)    47 y.o. female presents with the above complaint.   ROS: Denies fever chills nausea vomiting muscle aches pains calf pain back pain chest pain shortness of breath.  Past Medical History:  Diagnosis Date  . Anemia   . Heart murmur   . Hypertension    Past Surgical History:  Procedure Laterality Date  . TUBAL LIGATION    . WISDOM TOOTH EXTRACTION      Current Outpatient Medications:  .  atenolol (TENORMIN) 50 MG tablet, Take 1 tablet (50 mg total) by mouth daily., Disp: 90 tablet, Rfl: 1 .  ferrous fumarate (HEMOCYTE - 106 MG FE) 325 (106 Fe) MG TABS tablet, Take 1 tablet (106 mg of iron total) by mouth daily., Disp: 90 each, Rfl: 4 .  hydrochlorothiazide (HYDRODIURIL) 12.5 MG tablet, Take 1 tablet (12.5 mg total) by mouth 2 (two) times daily., Disp: 180 tablet, Rfl: 1 .  Iron-Vitamins (GERITOL PO), Take 1 tablet by mouth daily., Disp: , Rfl:  .  metFORMIN (GLUCOPHAGE XR) 500 MG 24 hr tablet, Take 1 tablet (500 mg total) by mouth daily with breakfast., Disp: 180 tablet, Rfl: 0  No Known Allergies Review of Systems Objective:   Vitals:   07/09/18 1044  BP: (!) 159/95  Pulse: 60  Resp: 16    General: Well developed, nourished, in no acute distress, alert and oriented x3   Dermatological: Skin is warm, dry and supple bilateral. Nails x 10 are well maintained; remaining integument appears unremarkable at this time. There are no open sores, no preulcerative lesions, no rash or signs of infection present.  Vascular: Dorsalis Pedis artery and Posterior Tibial artery pedal pulses are 2/4 bilateral with  immedate capillary fill time. Pedal hair growth present. No varicosities and no lower extremity edema present bilateral.   Neruologic: Grossly intact via light touch bilateral. Vibratory intact via tuning fork bilateral. Protective threshold with Semmes Wienstein monofilament intact to all pedal sites bilateral. Patellar and Achilles deep tendon reflexes 2+ bilateral. No Babinski or clonus noted bilateral.   Musculoskeletal: No gross boney pedal deformities bilateral. No pain, crepitus, or limitation noted with foot and ankle range of motion bilateral. Muscular strength 5/5 in all groups tested bilateral.  Mild tenderness on palpation medial calcaneal tubercles bilateral.  Gait: Unassisted, Nonantalgic.    Radiographs:  No significant findings.  Mild soft tissue increase in density plantar fashion calcaneal insertion sites.  Assessment & Plan:   Assessment: Plantar fasciitis diabetes mellitus without complications.  Plan: Discussed etiology pathology conservative surgical therapies at this point injected the bilateral heels today with plantar for plantar fasciitis with 20 mg of Kenalog 5 mg Marcaine point of maximal tenderness.  Plantar fascial braces bilaterally and also went ahead and had her size today for orthotics.  Follow-up with her once those come in.      T. [place], [place]

## 2018-08-06 ENCOUNTER — Ambulatory Visit: Payer: 59 | Admitting: Orthotics

## 2018-08-06 DIAGNOSIS — M722 Plantar fascial fibromatosis: Secondary | ICD-10-CM

## 2018-08-06 NOTE — Progress Notes (Signed)
Patient came in today to pick up custom made foot orthotics.  The goals were accomplished and the patient reported no dissatisfaction with said orthotics.  Patient was advised of breakin period and how to report any issues. 

## 2018-09-07 MED FILL — HYDROCHLOROTHIAZIDE 12.5 MG: 12.5 | 90 days supply | Qty: 180 | Fill #1

## 2018-09-07 MED FILL — ATENOLOL 50 MG TABLET: 50 | 90 days supply | Qty: 90 | Fill #1

## 2018-09-11 ENCOUNTER — Encounter: Payer: Self-pay | Admitting: Podiatry

## 2018-11-15 ENCOUNTER — Encounter: Payer: Self-pay | Admitting: Family Medicine

## 2018-11-18 ENCOUNTER — Other Ambulatory Visit: Payer: Self-pay

## 2018-11-18 DIAGNOSIS — E119 Type 2 diabetes mellitus without complications: Secondary | ICD-10-CM

## 2018-11-18 MED ORDER — GLUCOSE BLOOD VI STRP: ORAL_STRIP | 0 refills | Status: DC

## 2018-12-04 ENCOUNTER — Ambulatory Visit: Payer: 59 | Admitting: Family Medicine

## 2018-12-18 ENCOUNTER — Ambulatory Visit: Payer: 59 | Admitting: Family Medicine

## 2018-12-19 ENCOUNTER — Other Ambulatory Visit: Payer: Self-pay | Admitting: Family Medicine

## 2018-12-19 DIAGNOSIS — I10 Essential (primary) hypertension: Secondary | ICD-10-CM

## 2018-12-19 NOTE — Telephone Encounter (Signed)
Please review

## 2018-12-23 MED FILL — ATENOLOL 50 MG TABLET: 50 | 30 days supply | Qty: 30 | Fill #0

## 2018-12-23 MED FILL — HYDROCHLOROTHIAZIDE 12.5 MG: 12.5 | 15 days supply | Qty: 30 | Fill #0

## 2018-12-23 NOTE — Telephone Encounter (Signed)
Patient has an upcoming appointment on 12/30/18, refilled medication for 30 days, no refills.

## 2019-01-01 ENCOUNTER — Other Ambulatory Visit: Payer: Self-pay

## 2019-01-01 ENCOUNTER — Ambulatory Visit (INDEPENDENT_AMBULATORY_CARE_PROVIDER_SITE_OTHER): Payer: 59 | Admitting: Family Medicine

## 2019-01-01 ENCOUNTER — Encounter: Payer: Self-pay | Admitting: Family Medicine

## 2019-01-01 ENCOUNTER — Ambulatory Visit
Admission: RE | Admit: 2019-01-01 | Discharge: 2019-01-01 | Disposition: A | Discharge status: Home / Self Care | Payer: 59 | Source: Ambulatory Visit | Attending: Family Medicine | Admitting: Family Medicine

## 2019-01-01 VITALS — BP 138/88 | HR 79 | Temp 98.2°F | Resp 17 | Ht 67.13 in | Wt 224.0 lb

## 2019-01-01 DIAGNOSIS — M238X2 Other internal derangements of left knee: Secondary | ICD-10-CM

## 2019-01-01 DIAGNOSIS — E781 Pure hyperglyceridemia: Secondary | ICD-10-CM

## 2019-01-01 DIAGNOSIS — M238X1 Other internal derangements of right knee: Secondary | ICD-10-CM

## 2019-01-01 DIAGNOSIS — M25562 Pain in left knee: Secondary | ICD-10-CM

## 2019-01-01 DIAGNOSIS — I10 Essential (primary) hypertension: Secondary | ICD-10-CM | POA: Diagnosis not present

## 2019-01-01 DIAGNOSIS — Z6834 Body mass index (BMI) 34.0-34.9, adult: Secondary | ICD-10-CM

## 2019-01-01 DIAGNOSIS — M1711 Unilateral primary osteoarthritis, right knee: Secondary | ICD-10-CM | POA: Diagnosis not present

## 2019-01-01 DIAGNOSIS — D500 Iron deficiency anemia secondary to blood loss (chronic): Secondary | ICD-10-CM | POA: Diagnosis not present

## 2019-01-01 DIAGNOSIS — M2342 Loose body in knee, left knee: Secondary | ICD-10-CM

## 2019-01-01 DIAGNOSIS — E119 Type 2 diabetes mellitus without complications: Secondary | ICD-10-CM

## 2019-01-01 DIAGNOSIS — M1712 Unilateral primary osteoarthritis, left knee: Secondary | ICD-10-CM | POA: Diagnosis not present

## 2019-01-01 DIAGNOSIS — E6609 Other obesity due to excess calories: Secondary | ICD-10-CM

## 2019-01-01 LAB — POCT GLYCOSYLATED HEMOGLOBIN (HGB A1C): Hemoglobin A1C: 6.9 % — AB (ref 4.0–5.6)

## 2019-01-01 MED ORDER — ATENOLOL 50 MG PO TABS: 50.0000 mg | ORAL_TABLET | Freq: Every day | ORAL | 3 refills | Status: DC

## 2019-01-01 MED ORDER — METFORMIN HCL ER 500 MG PO TB24: 500.0000 mg | ORAL_TABLET | Freq: Every day | ORAL | 1 refills | Status: DC

## 2019-01-01 MED ORDER — HYDROCHLOROTHIAZIDE 12.5 MG PO CAPS: 12.5000 mg | ORAL_CAPSULE | Freq: Two times a day (BID) | ORAL | 1 refills | Status: DC

## 2019-01-01 MED FILL — METFORMIN HCL ER 500 MG TAB: 500 | 90 days supply | Qty: 90 | Fill #0

## 2019-01-01 NOTE — Patient Instructions (Signed)
     If you have lab work done today you will be contacted with your lab results within the next 2 weeks.  If you have not heard from us then please contact us. The fastest way to get your results is to register for My Chart.   IF you received an x-ray today, you will receive an invoice from [facility]. Please contact [facility] at [phone] with questions or concerns regarding your invoice.   IF you received labwork today, you will receive an invoice from [facility]. Please contact [facility] at [phone] with questions or concerns regarding your invoice.   Our billing staff will not be able to assist you with questions regarding bills from these companies.  You will be contacted with the lab results as soon as they are available. The fastest way to get your results is to activate your My Chart account. Instructions are located on the last page of this paperwork. If you have not heard from us regarding the results in 2 weeks, please contact this office.        If you have lab work done today you will be contacted with your lab results within the next 2 weeks.  If you have not heard from us then please contact us. The fastest way to get your results is to register for My Chart.   IF you received an x-ray today, you will receive an invoice from [facility]. Please contact [facility] at [phone] with questions or concerns regarding your invoice.   IF you received labwork today, you will receive an invoice from [facility]. Please contact [facility] at [phone] with questions or concerns regarding your invoice.   Our billing staff will not be able to assist you with questions regarding bills from these companies.  You will be contacted with the lab results as soon as they are available. The fastest way to get your results is to activate your My Chart account. Instructions are located on the last page of this paperwork. If you have not heard from us  regarding the results in 2 weeks, please contact this office.     

## 2019-01-01 NOTE — Progress Notes (Signed)
Established Patient Office Visit  Subjective:  Patient ID: Christina Schwartz, female    DOB: 1971/03/30  Age: 48 y.o. MRN: [id number]  CC:  Chief Complaint  Patient presents with  . Diabetes    f/u  . Medication Refill    90 day supply of metformin, atenolol and hctz    HPI [name] presents for   Hypertension: Patient here for follow-up of elevated blood pressure. She is not exercising and is adherent to low salt diet.  Blood pressure is well controlled at home. Cardiac symptoms none. Patient denies chest pain, dyspnea, exertional chest pressure/discomfort, irregular heart beat and palpitations.  Cardiovascular risk factors: diabetes mellitus and hypertension. Use of agents associated with hypertension: none. History of target organ damage: none. BP Readings from Last 3 Encounters:  01/01/19 138/88  07/09/18 (!) 159/95  05/31/18 (!) 142/84    Diabetes Mellitus: Patient presents for follow up of diabetes. Symptoms: polyuria. Symptoms have been well-controlled. Patient denies hyperglycemia, hypoglycemia , increase appetite, nausea, paresthesia of the feet, polydipsia, visual disturbances, vomitting and weight loss.  Evaluation to date has been included: hemoglobin A1C.  Home sugars: BGs consistently in an acceptable range. Treatment to date: Continued metformin which has been effective.  Lab Results  Component Value Date   HGBA1C 6.9 (A) 01/01/2019   Lab Results  Component Value Date   HGBA1C 6.9 (A) 01/01/2019    Obesity She admits that she is not exercising  She is limiting sugary drinks She is a part of the [facility] program  She is working to lose weight and has lost a few pounds in the past 30 days.  Body mass index is 34.95 kg/m. Wt Readings from Last 3 Encounters:  01/01/19 224 lb (101.6 kg)  05/31/18 210 lb 6.4 oz (95.4 kg)  11/27/17 218 lb 3.2 oz (99 kg)   Left knee pain She reports that in the past week she has been having left k nee pain that she  attributes to worn out shoes She states that when she is barefoot her symptoms are better She denies knee swelling but reports stiffness No acute injury No known history of arthritis  Past Medical History:  Diagnosis Date  . Anemia   . Heart murmur   . Hypertension     Past Surgical History:  Procedure Laterality Date  . TUBAL LIGATION    . WISDOM TOOTH EXTRACTION      Family History  Problem Relation Age of Onset  . Hypertension Father   . Pulmonary embolism Father   . Diabetes Maternal Grandmother   . Diabetes Sister   . Lymphoma Sister     Social History   Socioeconomic History  . Marital status: Legally Separated    Spouse name: bruce  . Number of children: 4  . Years of education: some college  . Highest education level: Not on file  Occupational History  . Occupation: Child psychotherapist: [employer]  . Financial resource strain: Not very hard  . Food insecurity    Worry: Never true    Inability: Never true  . Transportation needs    Medical: No    Non-medical: No  Tobacco Use  . Smoking status: Never Smoker  . Smokeless tobacco: Never Used  Substance and Sexual Activity  . Alcohol use: No  . Drug use: No  . Sexual activity: Yes    Partners: Male    Birth control/protection: None, Surgical    Comment:  BTL  Lifestyle  . Physical activity    Days per week: Not on file    Minutes per session: Not on file  . Stress: Not on file  Relationships  . Social Herbalist on phone: Not on file    Gets together: Not on file    Attends religious service: Not on file    Active member of club or organization: Not on file    Attends meetings of clubs or organizations: Not on file    Relationship status: Not on file  . Intimate partner violence    Fear of current or ex partner: Not on file    Emotionally abused: Not on file    Physically abused: Not on file    Forced sexual activity: Not on file  Other Topics Concern   . Not on file  Social History Narrative   Lives with 4 children.      No gums in home.   Wears seatbelt.    Outpatient Medications Prior to Visit  Medication Sig Dispense Refill  . ferrous fumarate (HEMOCYTE - 106 MG FE) 325 (106 Fe) MG TABS tablet Take 1 tablet (106 mg of iron total) by mouth daily. 90 each 4  . glucose blood (ACCU-CHEK ACTIVE STRIPS) test strip Use as instructed 100 each 0  . Iron-Vitamins (GERITOL PO) Take 1 tablet by mouth daily.    Marland Kitchen atenolol (TENORMIN) 50 MG tablet TAKE 1 TABLET BY MOUTH DAILY. 30 tablet 0  . hydrochlorothiazide (MICROZIDE) 12.5 MG capsule TAKE 1 CAPSULE BY MOUTH TWICE DAILY 30 capsule 0  . metFORMIN (GLUCOPHAGE XR) 500 MG 24 hr tablet Take 1 tablet (500 mg total) by mouth daily with breakfast. 180 tablet 0   No facility-administered medications prior to visit.     No Known Allergies  ROS Review of Systems Review of Systems  Constitutional: Negative for activity change, appetite change, chills and fever.  HENT: Negative for congestion, nosebleeds, trouble swallowing and voice change.   Respiratory: Negative for cough, shortness of breath and wheezing.   Gastrointestinal: Negative for diarrhea, nausea and vomiting.  Genitourinary: Negative for difficulty urinating, dysuria, flank pain and hematuria.  Musculoskeletal: Negative for back pain, joint swelling and neck pain.  Neurological: Negative for dizziness, speech difficulty, light-headedness and numbness.  See HPI. All other review of systems negative.     Objective:    Physical Exam  BP 138/88 (BP Location: Left Arm, Patient Position: Sitting, Cuff Size: Large)   Pulse 79   Temp 98.2 F (36.8 C) (Oral)   Resp 17   Ht 5' 7.13" (1.705 m)   Wt 224 lb (101.6 kg)   SpO2 96%   BMI 34.95 kg/m  Wt Readings from Last 3 Encounters:  01/01/19 224 lb (101.6 kg)  05/31/18 210 lb 6.4 oz (95.4 kg)  11/27/17 218 lb 3.2 oz (99 kg)   Diabetic Foot Exam - Simple   Simple Foot Form  Diabetic Foot exam was performed with the following findings: Yes 01/01/2019  8:29 AM  Visual Inspection No deformities, no ulcerations, no other skin breakdown bilaterally: Yes Sensation Testing Intact to touch and monofilament testing bilaterally: Yes Pulse Check Posterior Tibialis and Dorsalis pulse intact bilaterally: Yes Comments     Physical Exam  Constitutional: Oriented to person, place, and time. Appears well-developed and well-nourished.  HENT:  Head: Normocephalic and atraumatic.  Eyes: Conjunctivae and EOM are normal.  Cardiovascular: Normal rate, regular rhythm, normal heart sounds and intact distal pulses.  No murmur heard. Pulmonary/Chest: Effort normal and breath sounds normal. No stridor. No respiratory distress. Has no wheezes.  Neurological: Is alert and oriented to person, place, and time.  Skin: Skin is warm. Capillary refill takes less than 2 seconds.  Psychiatric: Has a normal mood and affect. Behavior is normal. Judgment and thought content normal.   Left knee with crepitus without joint line tenderness or effusion Right knee with crepitus without joint line tenderness or effusion  Both knees with tightness with range of motion   Health Maintenance Due  Topic Date Due  . FOOT EXAM  01/26/1981  . OPHTHALMOLOGY EXAM  01/26/1981  . URINE MICROALBUMIN  01/26/1981  . HIV Screening  01/26/1986  . HEMOGLOBIN A1C  11/29/2018    There are no preventive care reminders to display for this patient.  Lab Results  Component Value Date   TSH 0.849 11/27/2017   Lab Results  Component Value Date   WBC 5.3 11/27/2017   HGB 13.3 11/27/2017   HCT 43.0 11/27/2017   MCV 77 (L) 11/27/2017   PLT 317 11/27/2017   Lab Results  Component Value Date   NA 144 05/31/2018   K 3.8 05/31/2018   CO2 26 05/31/2018   GLUCOSE 102 (H) 05/31/2018   BUN 22 05/31/2018   CREATININE 0.84 05/31/2018   BILITOT 0.6 05/31/2018   ALKPHOS 71 05/31/2018   AST 23 05/31/2018   ALT  26 05/31/2018   PROT 7.6 05/31/2018   ALBUMIN 4.5 05/31/2018   CALCIUM 10.1 05/31/2018   ANIONGAP 12 07/22/2015   Lab Results  Component Value Date   CHOL 147 05/31/2018   Lab Results  Component Value Date   HDL 41 05/31/2018   Lab Results  Component Value Date   LDLCALC 73 05/31/2018   Lab Results  Component Value Date   TRIG 165 (H) 05/31/2018   Lab Results  Component Value Date   CHOLHDL 3.6 05/31/2018   Lab Results  Component Value Date   HGBA1C 6.9 (A) 01/01/2019      Assessment & Plan:   Problem List Items Addressed This Visit      Cardiovascular and Mediastinum   Essential hypertension  - Patient's blood pressure is at goal of 139/89 or less. Condition is stable. Continue current medications and treatment plan. I recommend that you exercise for 30-45 minutes 5 days a week. I also recommend a balanced diet with fruits and vegetables every day, lean meats, and little fried foods. The DASH diet (you can find this online) is a good example of this.    Relevant Medications   hydrochlorothiazide (MICROZIDE) 12.5 MG capsule   atenolol (TENORMIN) 50 MG tablet   Other Relevant Orders   Lipid panel     Endocrine   Type 2 diabetes mellitus without complication, without long-term current use of insulin ([place]) - Primary  - diabetes well controlled, continue metformin Will discuss adding a statin to improve triglycerides and prevent heart disease   Relevant Medications   metFORMIN (GLUCOPHAGE XR) 500 MG 24 hr tablet   Other Relevant Orders   POCT glycosylated hemoglobin (Hb A1C) (Completed)   Comprehensive metabolic panel   Microalbumin, urine   HM Diabetes Foot Exam (Completed)   Ambulatory referral to Ophthalmology     Other   Iron deficiency anemia due to chronic blood loss  - discussed that her hemoglobin impacts her a1c  Will check today   Relevant Orders   Iron, TIBC and Ferritin Panel   CBC  Hypertriglyceridemia  -  Will assess, continue dietary and  lifestyle modification    Relevant Medications   hydrochlorothiazide (MICROZIDE) 12.5 MG capsule   atenolol (TENORMIN) 50 MG tablet   Other Relevant Orders   Lipid panel    Other Visit Diagnoses    Crepitus of joint of left knee       Relevant Orders   DG Knee 1-2 Views Left   Acute pain of left knee    - Will evaluate for OA   Relevant Orders   DG Knee 1-2 Views Left   Crepitus of joint of right knee    -  Will evaluate for OA   Relevant Orders   DG Knee 1-2 Views Right   Class 1 obesity due to excess calories with serious comorbidity and body mass index (BMI) of 34.0 to 34.9 in adult    -  Advised exercise and metformin   Relevant Medications   metFORMIN (GLUCOPHAGE XR) 500 MG 24 hr tablet      Meds ordered this encounter  Medications  . metFORMIN (GLUCOPHAGE XR) 500 MG 24 hr tablet    Sig: Take 1 tablet (500 mg total) by mouth daily with breakfast.    Dispense:  90 tablet    Refill:  1  . hydrochlorothiazide (MICROZIDE) 12.5 MG capsule    Sig: Take 1 capsule (12.5 mg total) by mouth 2 (two) times daily.    Dispense:  180 capsule    Refill:  1  . atenolol (TENORMIN) 50 MG tablet    Sig: Take 1 tablet (50 mg total) by mouth daily.    Dispense:  90 tablet    Refill:  3   A total of 40 minutes were spent face-to-face with the patient during this encounter and over half of that time was spent on counseling and coordination of care.  Follow-up: No follow-ups on file.    Forrest Moron, MD

## 2019-01-02 LAB — IRON,TIBC AND FERRITIN PANEL
Ferritin: 49 ng/mL (ref 15–150)
Iron Saturation: 22 % (ref 15–55)
Iron: 71 ug/dL (ref 27–159)
Total Iron Binding Capacity: 319 ug/dL (ref 250–450)
UIBC: 248 ug/dL (ref 131–425)

## 2019-01-02 LAB — LIPID PANEL
Chol/HDL Ratio: 3.4 ratio (ref 0.0–4.4)
Cholesterol, Total: 152 mg/dL (ref 100–199)
HDL: 45 mg/dL (ref >39)
LDL Calculated: 81 mg/dL (ref 0–99)
Triglycerides: 128 mg/dL (ref 0–149)
VLDL Cholesterol Cal: 26 mg/dL (ref 5–40)

## 2019-01-02 LAB — COMPREHENSIVE METABOLIC PANEL
ALT: 21 IU/L (ref 0–32)
AST: 18 IU/L (ref 0–40)
Albumin/Globulin Ratio: 1.7 (ref 1.2–2.2)
Albumin: 4.3 g/dL (ref 3.8–4.8)
Alkaline Phosphatase: 68 IU/L (ref 39–117)
BUN/Creatinine Ratio: 21 (ref 9–23)
BUN: 17 mg/dL (ref 6–24)
Bilirubin Total: 0.7 mg/dL (ref 0.0–1.2)
CO2: 25 mmol/L (ref 20–29)
Calcium: 9.5 mg/dL (ref 8.7–10.2)
Chloride: 102 mmol/L (ref 96–106)
Creatinine, Ser: 0.82 mg/dL (ref 0.57–1.00)
GFR calc Af Amer: 99 mL/min/{1.73_m2} (ref >59)
GFR calc non Af Amer: 85 mL/min/{1.73_m2} (ref >59)
Globulin, Total: 2.6 g/dL (ref 1.5–4.5)
Glucose: 126 mg/dL — ABNORMAL HIGH (ref 65–99)
Potassium: 3.8 mmol/L (ref 3.5–5.2)
Sodium: 142 mmol/L (ref 134–144)
Total Protein: 6.9 g/dL (ref 6.0–8.5)

## 2019-01-02 LAB — CBC
Hematocrit: 40.6 % (ref 34.0–46.6)
Hemoglobin: 13.1 g/dL (ref 11.1–15.9)
MCH: 24.3 pg — ABNORMAL LOW (ref 26.6–33.0)
MCHC: 32.3 g/dL (ref 31.5–35.7)
MCV: 75 fL — ABNORMAL LOW (ref 79–97)
Platelets: 288 10*3/uL (ref 150–450)
RBC: 5.4 x10E6/uL — ABNORMAL HIGH (ref 3.77–5.28)
RDW: 16.2 % — ABNORMAL HIGH (ref 11.7–15.4)
WBC: 5.5 10*3/uL (ref 3.4–10.8)

## 2019-01-02 LAB — MICROALBUMIN, URINE: Microalbumin, Urine: 44.7 ug/mL

## 2019-01-02 NOTE — Addendum Note (Signed)
Addended by: Delia Chimes A on: 01/02/2019 09:39 AM   Modules accepted: Orders

## 2019-01-02 NOTE — Progress Notes (Signed)
EXAM: LEFT KNEE - 1-2 VIEW  COMPARISON:  None  FINDINGS: There are tiny marginal osteophytes in the patellofemoral and medial compartments. Calcified loose body in the posterior aspect of the joint. Effusion.  IMPRESSION: Slight arthritic changes with a loose body in the joint and a joint effusion.   Electronically Signed   By: Lorriane Shire M.D.   On: 01/01/2019 10:20   Will refer to Orthopedics

## 2019-01-08 ENCOUNTER — Ambulatory Visit: Payer: 59 | Admitting: Orthopaedic Surgery

## 2019-01-13 MED FILL — HYDROCHLOROTHIAZIDE 12.5 MG: 12.5 | 90 days supply | Qty: 180 | Fill #0

## 2019-01-14 ENCOUNTER — Encounter: Payer: Self-pay | Admitting: Family Medicine

## 2019-01-16 MED FILL — ATENOLOL 50 MG TABLET: 50 | 90 days supply | Qty: 90 | Fill #0

## 2019-01-20 ENCOUNTER — Other Ambulatory Visit: Payer: Self-pay

## 2019-01-20 ENCOUNTER — Ambulatory Visit (INDEPENDENT_AMBULATORY_CARE_PROVIDER_SITE_OTHER): Payer: 59 | Admitting: Orthopaedic Surgery

## 2019-01-20 DIAGNOSIS — M25561 Pain in right knee: Secondary | ICD-10-CM

## 2019-01-20 DIAGNOSIS — G8929 Other chronic pain: Secondary | ICD-10-CM

## 2019-01-20 DIAGNOSIS — M25562 Pain in left knee: Secondary | ICD-10-CM

## 2019-01-20 NOTE — Progress Notes (Signed)
Office Visit Note   Patient: Christina Schwartz           Date of Birth: 15-Jul-1970           MRN: [id number] Visit Date: 01/20/2019              Requested by: Forrest Moron, MD [place],  [place] [id number] PCP: Forrest Moron, MD   Assessment & Plan: Visit Diagnoses:  1. Chronic pain of left knee   2. Chronic pain of right knee     Plan: The patient understands that she does have mild to moderate arthritis of both knees.  I recommended quad strengthening exercises which I demonstrated to her.  I also recommended weight loss as well as a topical anti-inflammatory such as Voltaren gel.  She is doing better overall with shoe wear so I think she should continue with these types of shoes.  My next step for her would be steroid injections or even hyaluronic acid injections if needed.  She will continue to strengthen both knees.  She can continue knee sleeves or braces as needed.  All question concerns were answered and addressed.  She has my card for things worsen for her in any way.  Follow-Up Instructions: Return if symptoms worsen or fail to improve.   Orders:  No orders of the defined types were placed in this encounter.  No orders of the defined types were placed in this encounter.     Procedures: No procedures performed   Clinical Data: No additional findings.   Subjective: Chief Complaint  Patient presents with  . Left Knee  - Pain  . Right Knee - Pain  The patient comes in today for evaluation treatment of bilateral knee pain.  She is actually not hurting today and he will transition into some new shoes that are better fitting for her and since she  is referred to me her knees feel much better.  However there is a lot of grinding in both knees especially when she bends down and stands up.  Her right knee used to have a locking catching in her knee and a knee sleeve did help that.  She says the shoes have helped her the most.  She denies any injury to these knees and is never had surgery on her knees.  She does not take anything on a regular basis in terms of pain medication or anti-inflammatories for her knees.  HPI  Review of Systems She currently denies any headache, chest pain, shortness of breath, fever, chills, nausea, vomiting  Objective: Vital Signs: There were no vitals taken for this visit.  Physical Exam She is alert and orient x3 and in no acute distress Ortho Exam Examination of both knees shows significant patellofemoral crepitation but good range of motion overall.  Both patellas track just slightly laterally.  Neither knee has an effusion.  Both knees are ligamentously stable. Specialty Comments:  No specialty comments available.  Imaging: No results found. An AP and lateral both knees on the canopy system are independently reviewed and does show mild to moderate arthritic changes in both knees.  The medial lateral compartments are well-maintained but there is significant osteophytes posteriorly and at the patellofemoral joint that are equal bilaterally.  PMFS History: Patient Active Problem List   Diagnosis Date Noted  . Type 2 diabetes mellitus without complication, without long-term current use of insulin ([place]) 06/09/2018  . Hypertriglyceridemia 06/09/2018  . Essential hypertension 08/18/2016  . Iron deficiency anemia due to chronic blood loss 08/18/2016  . Fibroids 05/08/2012  .  Menorrhagia 04/15/2012   Past Medical History:  Diagnosis Date  . Anemia   . Heart murmur   . Hypertension     Family History  Problem Relation Age of Onset  . Hypertension Father   . Pulmonary embolism Father   . Diabetes Maternal Grandmother   . Diabetes Sister   . Lymphoma Sister     Past Surgical History:  Procedure Laterality Date  . TUBAL LIGATION    . WISDOM TOOTH EXTRACTION     Social History   Occupational History  . Occupation: Child psychotherapist: [employer]  Tobacco Use  . Smoking status: Never Smoker  . Smokeless tobacco: Never Used  Substance and Sexual Activity  . Alcohol use: No  . Drug use: No  . Sexual activity: Yes    Partners: Male    Birth control/protection: None, Surgical    Comment: BTL

## 2019-02-03 ENCOUNTER — Other Ambulatory Visit: Payer: Self-pay

## 2019-02-03 ENCOUNTER — Telehealth: Payer: 59 | Admitting: Family Medicine

## 2019-02-05 ENCOUNTER — Other Ambulatory Visit: Payer: Self-pay

## 2019-02-05 ENCOUNTER — Encounter: Payer: Self-pay | Admitting: Family Medicine

## 2019-02-05 ENCOUNTER — Ambulatory Visit (INDEPENDENT_AMBULATORY_CARE_PROVIDER_SITE_OTHER): Payer: 59 | Admitting: Family Medicine

## 2019-02-05 VITALS — BP 133/90 | HR 65 | Temp 98.6°F | Ht 66.0 in | Wt 220.4 lb

## 2019-02-05 DIAGNOSIS — E119 Type 2 diabetes mellitus without complications: Secondary | ICD-10-CM | POA: Diagnosis not present

## 2019-02-05 DIAGNOSIS — G4730 Sleep apnea, unspecified: Secondary | ICD-10-CM

## 2019-02-05 DIAGNOSIS — D500 Iron deficiency anemia secondary to blood loss (chronic): Secondary | ICD-10-CM | POA: Diagnosis not present

## 2019-02-05 DIAGNOSIS — E6609 Other obesity due to excess calories: Secondary | ICD-10-CM

## 2019-02-05 DIAGNOSIS — Z0001 Encounter for general adult medical examination with abnormal findings: Secondary | ICD-10-CM | POA: Diagnosis not present

## 2019-02-05 DIAGNOSIS — Z6834 Body mass index (BMI) 34.0-34.9, adult: Secondary | ICD-10-CM

## 2019-02-05 DIAGNOSIS — Z0000 Encounter for general adult medical examination without abnormal findings: Secondary | ICD-10-CM | POA: Insufficient documentation

## 2019-02-05 DIAGNOSIS — E781 Pure hyperglyceridemia: Secondary | ICD-10-CM | POA: Diagnosis not present

## 2019-02-05 DIAGNOSIS — I10 Essential (primary) hypertension: Secondary | ICD-10-CM | POA: Diagnosis not present

## 2019-02-05 NOTE — Progress Notes (Signed)
7/29/20208:22 AM  Christina Schwartz 1971/05/20, 48 y.o., female [id number]  Chief Complaint  Patient presents with  . Annual Exam    cpe    HPI:  DM-takes metformin, occasionally soda, no recent eye exam-needs eye exam, tingling on the feet, pt changed shoes HTN-atenolol daily-stalbe Sleep apnea concern-low oxygen, +FH sleep apnea, snoring and stops breathing  Fall Risk  02/05/2019 01/01/2019 01/01/2019 05/31/2018 11/27/2017  Falls in the past year? 0 0 0 0 No  Number falls in past yr: 0 0 0 - -  Injury with Fall? 0 0 0 - -  Follow up Falls evaluation completed Falls evaluation completed Falls evaluation completed - -   Depression screen [facility] 2/9 02/05/2019 01/01/2019 01/01/2019  Decreased Interest 0 0 0  Down, Depressed, Hopeless 0 0 0  PHQ - 2 Score 0 0 0    No Known Allergies  Prior to Admission medications   Medication Sig Start Date End Date Taking? Authorizing Provider  atenolol (TENORMIN) 50 MG tablet Take 1 tablet (50 mg total) by mouth daily. 01/01/19   Forrest Moron, MD  ferrous fumarate (HEMOCYTE - 106 MG FE) 325 (106 Fe) MG TABS tablet Take 1 tablet (106 mg of iron total) by mouth daily. 11/27/17   Weber, Damaris Hippo, PA-C  glucose blood (ACCU-CHEK ACTIVE STRIPS) test strip Use as instructed 11/18/18   Delia Chimes A, MD  hydrochlorothiazide (MICROZIDE) 12.5 MG capsule Take 1 capsule (12.5 mg total) by mouth 2 (two) times daily. 01/01/19   Forrest Moron, MD  Iron-Vitamins (GERITOL PO) Take 1 tablet by mouth daily.    [provider]  metFORMIN (GLUCOPHAGE XR) 500 MG 24 hr tablet Take 1 tablet (500 mg total) by mouth daily with breakfast. 01/01/19   Forrest Moron, MD    Past Medical History:  Diagnosis Date  . Anemia   . Heart murmur   . Hypertension     Past Surgical History:  Procedure Laterality Date  . TUBAL LIGATION    . WISDOM TOOTH EXTRACTION      Social History   Tobacco Use  . Smoking status: Never Smoker  . Smokeless tobacco: Never Used   Substance Use Topics  . Alcohol use: No    Social History   Social History Narrative   Lives with 4 children.      No gums in home.   Wears seatbelt.  works at [employer] in environmental services  Family History  Problem Relation Age of Onset  . Hypertension Father   . Pulmonary embolism Father   . Diabetes Maternal Grandmother   . Diabetes Sister   . Lymphoma Sister     ROS: Constitutional: no loss of appetite,  Started weight watchers  HEENT: no difficulty with hearing, sinus problems, runny nose, post nasal drip, ringing in the ears, mouth sores, loose teeth, ear pain, nosebleeds, sore throat, facial pain or numbness, no visual changes, no itching or drainage of the eyes-wears glasses CV:  Heart murmur, no chest pain, swelling of the feet or legs, leg pain with walking  RESP: no short of breath, night sweats, prolonged cough, wheezing, sputum production GI: no heartburn, constipation, diarrhea, foot intolerance, abdominal pain, pain with swallowing, nausea, vomiting, blood in stool, changes in bowels, no polyps GU: no painful urination, frequent of urination, urgency, bladder problems MS: right knee pain pain-ortho suggestion weight loss-xray reviewed-arthritis, oral tylenol, no injection, no back pain Skin:no persistent rash, new skin lesions Neuro: no headaches, double or blurred  vision, weakness, dizziness noted associated with lack of sleep PSY: no insomnia, depression, anxiety, mood swings, difficulty with low oxygen while sleeping noted on garmin monitor Endo: no intolerance to heat or cold, no increase in thirst Heme: no easy brusing, anemia-well controlled-taking iron as needed , swollen lymph nodes Allergy: no seasonal allergies, hay fever PAP-5/19 -normal pap , HPV negative Mammogram 11/19-normal Today's Vitals   02/05/19 0808  BP: 133/90  Pulse: 65  Temp: 98.6 F (37 C)  SpO2: 97%  Weight: 220 lb 6.4 oz (100 kg)  Height: 5\' 6"  (1.676 m)  PainSc: 0-No  pain   Body mass index is 35.57 kg/m.   EXAM:  Constitutional:  HEENT: normocephalic, atraumatic Sclera white, conjunctiva non injected, PERRL External ear bilat  with no lesions or masses, auditory canal bilat normal Eardrum bilat with positive light reflex Nose clear drainage with no lesions and no swelling of the turbinates Throat and mouth with good dentition, no lesions, no bleeding Pharynx and tonsils with no exudate or erythema Neck: normal flexion/extension/rotation Thyroid: no enlargement Lungs: clear breath sounds to auscultation Heart: no lifts or heaves , normal S1/2  on auscultation Abdomen: ventral hernia, normal bowel sounds, no tenderness to palpation, liver and spleen non palpable Back: no CVA tenderness, no paraspinal tenderness Ext: upper-no cyanosis, normal flexion/extension-wrists, elbows, shoulders Radial pulses @+ , normal and symmetrical; lower-normal flexion-knees, hips, ankles, pulses posterior tibial and dorsalis pedis 2+ bilat equal Lymph nodes-anterior and posterior cervical not palpable Neuro: awake and alert, oriented to person, place and time, normal gait and balance  ASSESSMENT/PLAN:  1. Type 2 diabetes mellitus without complication, without long-term current use of insulin ([facility]) D/w pt A1c 6.9%-repeat in 6 months-continue metformin, pt on weight watchers to improve diet - Ambulatory referral to Ophthalmology  2. Sleep apnea, unspecified type - Ambulatory referral to Sleep Studies  3. Iron deficiency anemia due to chronic blood loss Consider taking iron when having periods  4. Essential hypertension Continue atenolol daily-stable, unable to tolerate ACE due to cough  5. Hypertriglyceridemia Diet changes, no medication  6. Class 1 obesity due to excess calories with serious comorbidity and body mass index (BMI) of 34.0 to 34.9 in adult Weight watchers -3rd week  7. Encounter for wellness examination in adult Pap completed 19-repeat in 2  years mammo -repeat in 11/20 Influenza in Sept 2020 Pt declined breast exam  Benny Lennert, MD

## 2019-02-13 ENCOUNTER — Other Ambulatory Visit: Payer: Self-pay

## 2019-02-13 ENCOUNTER — Ambulatory Visit (INDEPENDENT_AMBULATORY_CARE_PROVIDER_SITE_OTHER): Payer: 59 | Admitting: Neurology

## 2019-02-13 ENCOUNTER — Encounter: Payer: Self-pay | Admitting: Neurology

## 2019-02-13 VITALS — BP 125/80 | HR 65 | Ht 66.0 in | Wt 219.0 lb

## 2019-02-13 DIAGNOSIS — R0681 Apnea, not elsewhere classified: Secondary | ICD-10-CM

## 2019-02-13 DIAGNOSIS — Z820 Family history of epilepsy and other diseases of the nervous system: Secondary | ICD-10-CM | POA: Diagnosis not present

## 2019-02-13 DIAGNOSIS — R51 Headache: Secondary | ICD-10-CM | POA: Diagnosis not present

## 2019-02-13 DIAGNOSIS — G4719 Other hypersomnia: Secondary | ICD-10-CM | POA: Diagnosis not present

## 2019-02-13 DIAGNOSIS — E669 Obesity, unspecified: Secondary | ICD-10-CM | POA: Diagnosis not present

## 2019-02-13 DIAGNOSIS — R0683 Snoring: Secondary | ICD-10-CM

## 2019-02-13 DIAGNOSIS — R519 Headache, unspecified: Secondary | ICD-10-CM

## 2019-02-13 NOTE — Patient Instructions (Signed)

## 2019-02-13 NOTE — Progress Notes (Signed)
Subjective:    Patient ID: Christina Schwartz is a 48 y.o. female.  HPI     Star Age, MD, PhD [facility] [address] [place], [place] [address]  Dear Dr. Holly Bodily,   I saw your patient, Berneice Zettlemoyer, upon your kind request in my sleep clinic today for initial consultation of her sleep disturbance, in particular, concern for underlying obstructive sleep apnea.  The patient is unaccompanied today.  As you know, Ms. Bonenfant is a 48 year old right-handed woman with an underlying medical history of hypertension, diabetes, hypertriglyceridemia, anemia and obesity, who reports snoring and excessive daytime somnolence.  She has a family history of sleep apnea.  I reviewed your office note from 02/05/2019. Her Epworth sleepiness score is 13 out of 24, fatigue severity score is 48 out of 63.  She is typically in bed between 930 and 10 and rise time is around 6.  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  She lives alone and for grown children.  She has no pets in the household.  Recently when she stayed at her mother's house, her sister-in-law noticed apneas while patient was asleep.  They shared a room at the time.  Her brother has sleep apnea and uses a CPAP machine as well as her sister and she also reports that her father had sleep apnea, he passed away at age 51.  She has rare morning headaches, no night to night nocturia.  She denies any telltale symptoms of restless leg syndrome.  She would be willing to get tested for sleep apnea and consider CPAP therapy.  She is trying to lose weight through weight watchers and has lost 5 pounds thus far.  She is a non-smoker and does not drink alcohol and drinks caffeine and limitation, occasional soda, no daily coffee or tea.  Her Past Medical History Is Significant For: Past Medical History:  Diagnosis Date  . Anemia   . Heart murmur   . Hypertension     Her Past  Surgical History Is Significant For: Past Surgical History:  Procedure Laterality Date  . TUBAL LIGATION    . WISDOM TOOTH EXTRACTION      Her Family History Is Significant For: Family History  Problem Relation Age of Onset  . Hypertension Father   . Pulmonary embolism Father   . Diabetes Maternal Grandmother   . Diabetes Sister   . Lymphoma Sister     Her Social History Is Significant For: Social History   Socioeconomic History  . Marital status: Legally Separated    Spouse name: bruce  . Number of children: 4  . Years of education: some college  . Highest education level: Not on file  Occupational History  . Occupation: Child psychotherapist: [employer]  . Financial resource strain: Not very hard  . Food insecurity    Worry: Never true    Inability: Never true  . Transportation needs    Medical: No    Non-medical: No  Tobacco Use  . Smoking status: Never Smoker  . Smokeless tobacco: Never Used  Substance and Sexual Activity  . Alcohol use: No  . Drug use: No  . Sexual activity: Yes    Partners: Male    Birth control/protection: None, Surgical    Comment: BTL  Lifestyle  . Physical activity    Days per week: Not on file    Minutes per session:  Not on file  . Stress: Not on file  Relationships  . Social Herbalist on phone: Not on file    Gets together: Not on file    Attends religious service: Not on file    Active member of club or organization: Not on file    Attends meetings of clubs or organizations: Not on file    Relationship status: Not on file  Other Topics Concern  . Not on file  Social History Narrative   Lives with 4 children.      No gums in home.   Wears seatbelt.    Her Allergies Are:  No Known Allergies:   Her Current Medications Are:  Outpatient Encounter Medications as of 02/13/2019  Medication Sig  . atenolol (TENORMIN) 50 MG tablet Take 1 tablet (50 mg total) by mouth daily.  . ferrous  fumarate (HEMOCYTE - 106 MG FE) 325 (106 Fe) MG TABS tablet Take 1 tablet (106 mg of iron total) by mouth daily.  Marland Kitchen glucose blood (ACCU-CHEK ACTIVE STRIPS) test strip Use as instructed  . hydrochlorothiazide (MICROZIDE) 12.5 MG capsule Take 1 capsule (12.5 mg total) by mouth 2 (two) times daily.  . Iron-Vitamins (GERITOL PO) Take 1 tablet by mouth daily.  . metFORMIN (GLUCOPHAGE XR) 500 MG 24 hr tablet Take 1 tablet (500 mg total) by mouth daily with breakfast.   No facility-administered encounter medications on file as of 02/13/2019.   :  Review of Systems:  Out of a complete 14 point review of systems, all are reviewed and negative with the exception of these symptoms as listed below: Review of Systems  Neurological:       Pt presents today to discuss her sleep. Pt has never had a sleep study but does endorse snoring.  Epworth Sleepiness Scale 0= would never doze 1= slight chance of dozing 2= moderate chance of dozing 3= high chance of dozing  Sitting and reading: 2 Watching TV: 1 Sitting inactive in a public place (ex. Theater or meeting): 2 As a passenger in a car for an hour without a break: 3 Lying down to rest in the afternoon: 3 Sitting and talking to someone: 1 Sitting quietly after lunch (no alcohol): 1 In a car, while stopped in traffic: 0 Total: 13     Objective:  Neurological Exam  Physical Exam Physical Examination:   Vitals:   02/13/19 0848  BP: 125/80  Pulse: 65    General Examination: The patient is a very pleasant 48 y.o. female in no acute distress. She appears well-developed and well-nourished and well groomed.   HEENT: Normocephalic, atraumatic, pupils are equal, round and reactive to light and accommodation. Funduscopic exam is normal with sharp disc margins noted. Extraocular tracking is good without limitation to gaze excursion or nystagmus noted. Normal smooth pursuit is noted. Hearing is grossly intact. Tympanic membranes are clear bilaterally.  Face is symmetric with normal facial animation and normal facial sensation. Speech is clear with no dysarthria noted. There is no hypophonia. There is no lip, neck/head, jaw or voice tremor. Neck is supple with full range of passive and active motion. There are no carotid bruits on auscultation. Oropharynx exam reveals: mild mouth dryness, adequate dental hygiene and moderate airway crowding, due to Small airway entry, redundant soft palate, tonsils of 1+, Mallampati class III.  Tongue protrudes centrally in palate elevates symmetrically.  Neck circumference is 17 and three-quarter inches.  She has a mild to moderate overbite.  Chest: Clear  to auscultation without wheezing, rhonchi or crackles noted.  Heart: S1+S2+0, regular and normal without murmurs, rubs or gallops noted.   Abdomen: Soft, non-tender and non-distended with normal bowel sounds appreciated on auscultation.  Extremities: There is no pitting edema in the distal lower extremities bilaterally. Pedal pulses are intact.  Skin: Warm and dry without trophic changes noted.  Musculoskeletal: exam reveals no obvious joint deformities, tenderness or joint swelling or erythema.   Neurologically:  Mental status: The patient is awake, alert and oriented in all 4 spheres. Her immediate and remote memory, attention, language skills and fund of knowledge are appropriate. There is no evidence of aphasia, agnosia, apraxia or anomia. Speech is clear with normal prosody and enunciation. Thought process is linear. Mood is normal and affect is normal.  Cranial nerves II - XII are as described above under HEENT exam. In addition: shoulder shrug is normal with equal shoulder height noted. Motor exam: Normal bulk, strength and tone is noted. There is no drift, tremor or rebound. Romberg is negative. Reflexes are 2+ in the UEs and LEs. Fine motor skills and coordination: intact with normal finger taps, normal hand movements, normal rapid alternating patting,  normal foot taps and normal foot agility.  Cerebellar testing: No dysmetria or intention tremor on finger to nose testing. Heel to shin is unremarkable bilaterally. There is no truncal or gait ataxia.  Sensory exam: intact to light touch in the upper and lower extremities.  Gait, station and balance: She stands easily. No veering to one side is noted. No leaning to one side is noted. Posture is age-appropriate and stance is narrow based. Gait shows normal stride length and normal pace. No problems turning are noted. Tandem walk is unremarkable.   Assessment and Plan:   In summary, Aleyah Balik is a very pleasant 48 y.o.-year old female with an underlying medical history of hypertension, diabetes, hypertriglyceridemia, anemia and obesity, whose history and physical exam are concerning for obstructive sleep apnea (OSA). I had a long chat with the patient about my findings and the diagnosis of OSA, its prognosis and treatment options. We talked about medical treatments, surgical interventions and non-pharmacological approaches. I explained in particular the risks and ramifications of untreated moderate to severe OSA, especially with respect to developing cardiovascular disease down the Road, including congestive heart failure, difficult to treat hypertension, cardiac arrhythmias, or stroke. Even type 2 diabetes has, in part, been linked to untreated OSA. Symptoms of untreated OSA include daytime sleepiness, memory problems, mood irritability and mood disorder such as depression and anxiety, lack of energy, as well as recurrent headaches, especially morning headaches. We talked about trying to maintain a healthy lifestyle in general, as well as the importance of weight control. I encouraged the patient to eat healthy, exercise daily and keep well hydrated, to keep a scheduled bedtime and wake time routine, to not skip any meals and eat healthy snacks in between meals. I advised the patient not to drive when  feeling sleepy. I recommended the following at this time: sleep study.   I explained the sleep test procedure to the patient and also outlined possible surgical and non-surgical treatment options of OSA, including the use of a custom-made dental device (which would require a referral to a specialist dentist or oral surgeon), upper airway surgical options, such as pillar implants, radiofrequency surgery, tongue base surgery, and UPPP (which would involve a referral to an ENT surgeon). Rarely, jaw surgery such as mandibular advancement may be considered.  I also  explained the CPAP treatment option to the patient, who indicated that she would be willing to try CPAP if the need arises. I explained the importance of being compliant with PAP treatment, not only for insurance purposes but primarily to improve Her symptoms, and for the patient's long term health benefit, including to reduce Her cardiovascular risks. I answered all her questions today and the patient was in agreement. I plan to see her back after the sleep study is completed and encouraged her to call with any interim questions, concerns, problems or updates.   Thank you very much for allowing me to participate in the care of this nice patient. If I can be of any further assistance to you please do not hesitate to call me at [phone].  Sincerely,   Star Age, MD, PhD

## 2019-02-21 DIAGNOSIS — H35033 Hypertensive retinopathy, bilateral: Secondary | ICD-10-CM | POA: Diagnosis not present

## 2019-02-21 DIAGNOSIS — E119 Type 2 diabetes mellitus without complications: Secondary | ICD-10-CM | POA: Diagnosis not present

## 2019-02-21 DIAGNOSIS — H0288A Meibomian gland dysfunction right eye, upper and lower eyelids: Secondary | ICD-10-CM | POA: Diagnosis not present

## 2019-02-21 DIAGNOSIS — H04123 Dry eye syndrome of bilateral lacrimal glands: Secondary | ICD-10-CM | POA: Diagnosis not present

## 2019-02-21 DIAGNOSIS — H0288B Meibomian gland dysfunction left eye, upper and lower eyelids: Secondary | ICD-10-CM | POA: Diagnosis not present

## 2019-02-21 LAB — HM DIABETES EYE EXAM

## 2019-02-27 MED FILL — ACCU-CHEK GUIDE STRP: 90 days supply | Qty: 100 | Fill #0

## 2019-03-06 ENCOUNTER — Ambulatory Visit (INDEPENDENT_AMBULATORY_CARE_PROVIDER_SITE_OTHER): Payer: 59 | Admitting: Neurology

## 2019-03-06 ENCOUNTER — Other Ambulatory Visit: Payer: Self-pay

## 2019-03-06 DIAGNOSIS — R519 Headache, unspecified: Secondary | ICD-10-CM

## 2019-03-06 DIAGNOSIS — R0683 Snoring: Secondary | ICD-10-CM

## 2019-03-06 DIAGNOSIS — R0681 Apnea, not elsewhere classified: Secondary | ICD-10-CM

## 2019-03-06 DIAGNOSIS — E669 Obesity, unspecified: Secondary | ICD-10-CM

## 2019-03-06 DIAGNOSIS — Z820 Family history of epilepsy and other diseases of the nervous system: Secondary | ICD-10-CM

## 2019-03-06 DIAGNOSIS — G4720 Circadian rhythm sleep disorder, unspecified type: Secondary | ICD-10-CM

## 2019-03-06 DIAGNOSIS — G4733 Obstructive sleep apnea (adult) (pediatric): Secondary | ICD-10-CM

## 2019-03-06 DIAGNOSIS — G4719 Other hypersomnia: Secondary | ICD-10-CM

## 2019-03-12 ENCOUNTER — Telehealth: Payer: Self-pay

## 2019-03-12 NOTE — Addendum Note (Signed)
Addended by: Star Age on: 03/12/2019 08:39 AM   Modules accepted: Orders

## 2019-03-12 NOTE — Telephone Encounter (Signed)
Pt has called RN Kristen back, please call

## 2019-03-12 NOTE — Progress Notes (Signed)
Patient referred by Dr. Holly Bodily, seen by me on 02/13/19, diagnostic PSG on 03/06/19.   Please call and notify the patient that the recent sleep study showed severe obstructive sleep apnea, with a total AHI of 47.5/hour, REM AHI of 78/hour, supine AHI of 54.9/hour and O2 nadir of 78%. I recommend treatment in the form of CPAP. This will require a repeat sleep study for proper titration and mask fitting and correct monitoring of the oxygen saturations. Please explain to patient. I have placed an order in the chart. Thanks.  Star Age, MD, PhD [facility])

## 2019-03-12 NOTE — Procedures (Signed)
PATIENT'S NAME:  Christina Schwartz, Christina Schwartz DOB:      [birth year]      MR#:    [id number]     DATE OF RECORDING: 03/06/2019 REFERRING M.D.:  Benny Lennert, MD Study Performed:   Baseline Polysomnogram HISTORY: 48 year old woman with a history of hypertension, diabetes, hypertriglyceridemia, anemia and obesity, who reports snoring and excessive daytime somnolence.  She has a family history of sleep apnea. Her Epworth sleepiness score is 13 out of 24. The patient's weight 219 pounds with a height of 66 (inches), resulting in a BMI of 35.1 kg/m2. The patient's neck circumference measured 17.8 inches.  CURRENT MEDICATIONS: Tenormin, Hemocyte, Microzide, Geritol, Glucophage   PROCEDURE:  This is a multichannel digital polysomnogram utilizing the Somnostar 11.2 system.  Electrodes and sensors were applied and monitored per AASM Specifications.   EEG, EOG, Chin and Limb EMG, were sampled at 200 Hz.  ECG, Snore and Nasal Pressure, Thermal Airflow, Respiratory Effort, CPAP Flow and Pressure, Oximetry was sampled at 50 Hz. Digital video and audio were recorded.      BASELINE STUDY  Lights Out was at 21:58 and Lights On at 05:00.  Total recording time (TRT) was 422.5 minutes, with a total sleep time (TST) of 391.5 minutes.   The patient's sleep latency was 22.5 minutes.  REM latency was 87.5 minutes, which is normal. The sleep efficiency was 92.7 %.     SLEEP ARCHITECTURE: WASO (Wake after sleep onset) was 13 minutes with minimal sleep fragmentation noted. There were 5.5 minutes in Stage N1, 279.5 minutes Stage N2, 56.5 minutes Stage N3 and 50 minutes in Stage REM.  The percentage of Stage N1 was 1.4%, Stage N2 was 71.4%, which is increased, Stage N3 was 14.4%, which is near-normal, and Stage R (REM sleep) was 12.8%, which is reduced. The arousals were noted as: 27 were spontaneous, 0 were associated with PLMs, 251 were associated with respiratory events.  RESPIRATORY ANALYSIS:  There were a total of 310 respiratory events:   213 obstructive apneas, 1 central apneas and 1 mixed apneas with a total of 215 apneas and an apnea index (AI) of 33. /hour. There were 95 hypopneas with a hypopnea index of 14.6 /hour. The patient also had 0 respiratory event related arousals (RERAs).      The total APNEA/HYPOPNEA INDEX (AHI) was 47.5/hour and the total RESPIRATORY DISTURBANCE INDEX was 47.5 /hour.  65 events occurred in REM sleep and 153 events in NREM. The REM AHI was 78 /hour, versus a non-REM AHI of 43.. The patient spent 112.5 minutes of total sleep time in the supine position and 279 minutes in non-supine. The supine AHI was 54.9 versus a non-supine AHI of 44.5.  OXYGEN SATURATION & C02:  The Wake baseline 02 saturation was 94%, with the lowest being 78%. Time spent below 89% saturation equaled 201 minutes.  PERIODIC LIMB MOVEMENTS: The patient had a total of 0 Periodic Limb Movements.  The Periodic Limb Movement (PLM) index was 0 and the PLM Arousal index was 0/hour.  Audio and video analysis did not show any abnormal or unusual movements, behaviors, phonations or vocalizations. The patient took no bathroom breaks. Snoring was noted, ranging from mild to loud. The EKG was in keeping with normal sinus rhythm (NSR).  Post-study, the patient indicated that sleep was the same as usual.   IMPRESSION:  1. Obstructive Sleep Apnea (OSA) 2. Dysfunctions associated with sleep stages or arousal from sleep  RECOMMENDATIONS:  1. This study demonstrates severe obstructive sleep apnea,  with a total AHI of 47.5/hour, REM AHI of 78/hour, supine AHI of 54.9/hour and O2 nadir of 78%. Treatment with positive airway pressure in the form of CPAP is recommended. This will require a full night titration study to optimize therapy. Other treatment options may include - generally speaking - avoidance of supine sleep position along with weight loss, upper airway or jaw surgery in selected patients or the use of an oral appliance in certain patients.  ENT evaluation and/or consultation with a maxillofacial surgeon or dentist may be feasible in some instances. PAP therapy is recommended as first line treatment.   2. Please note that untreated obstructive sleep apnea may carry additional perioperative morbidity. Patients with significant obstructive sleep apnea should receive perioperative PAP therapy and the surgeons and particularly the anesthesiologist should be informed of the diagnosis and the severity of the sleep disordered breathing. 3. This study shows some sleep fragmentation and mildly abnormal sleep stage percentages; these are nonspecific findings and per se do not signify an intrinsic sleep disorder or a cause for the patient's sleep-related symptoms. Causes include (but are not limited to) the first night effect of the sleep study, circadian rhythm disturbances, medication effect or an underlying mood disorder or medical problem.  4. The patient should be cautioned not to drive, work at heights, or operate dangerous or heavy equipment when tired or sleepy. Review and reiteration of good sleep hygiene measures should be pursued with any patient. 5. The patient will be seen in follow-up in the sleep clinic at [facility] for discussion of the test results, symptom and treatment compliance review, further management strategies, etc. The referring provider will be notified of the test results.  I certify that I have reviewed the entire raw data recording prior to the issuance of this report in accordance with the Standards of Accreditation of the American Academy of Sleep Medicine (AASM)    Star Age, MD, PhD Diplomat, American Board of Neurology and Sleep Medicine (Neurology and Sleep Medicine)

## 2019-03-12 NOTE — Telephone Encounter (Signed)

## 2019-03-12 NOTE — Telephone Encounter (Signed)
-----   Message from Star Age, MD sent at 03/12/2019  8:39 AM EDT ----- Patient referred by Dr. Holly Bodily, seen by me on 02/13/19, diagnostic PSG on 03/06/19.   Please call and notify the patient that the recent sleep study showed severe obstructive sleep apnea, with a total AHI of 47.5/hour, REM AHI of 78/hour, supine AHI of 54.9/hour and O2 nadir of 78%. I recommend treatment in the form of CPAP. This will require a repeat sleep study for proper titration and mask fitting and correct monitoring of the oxygen saturations. Please explain to patient. I have placed an order in the chart. Thanks.  Star Age, MD, PhD [facility])

## 2019-03-12 NOTE — Telephone Encounter (Signed)
I called pt to discuss her sleep study results. No answer, left a message asking her to call me back. 

## 2019-03-18 MED FILL — ACCU-CHEK GUIDE STRP: 90 days supply | Qty: 100 | Fill #0

## 2019-04-07 MED FILL — ATENOLOL 50 MG TABLET: 50 | 90 days supply | Qty: 90 | Fill #1

## 2019-04-07 MED FILL — HYDROCHLOROTHIAZIDE 12.5 MG: 12.5 | 90 days supply | Qty: 180 | Fill #1

## 2019-04-07 MED FILL — metFORMIN HCL ER 500 MG TB2: 500 | 90 days supply | Qty: 90 | Fill #0

## 2019-04-14 ENCOUNTER — Other Ambulatory Visit (HOSPITAL_COMMUNITY)
Admission: RE | Admit: 2019-04-14 | Discharge: 2019-04-14 | Disposition: A | Discharge status: Home / Self Care | Payer: 59 | Source: Ambulatory Visit | Attending: Neurology | Admitting: Neurology

## 2019-04-14 DIAGNOSIS — Z01812 Encounter for preprocedural laboratory examination: Secondary | ICD-10-CM | POA: Diagnosis not present

## 2019-04-14 DIAGNOSIS — Z20828 Contact with and (suspected) exposure to other viral communicable diseases: Secondary | ICD-10-CM | POA: Diagnosis not present

## 2019-04-14 LAB — SARS CORONAVIRUS 2 (TAT 6-24 HRS): SARS Coronavirus 2: NEGATIVE

## 2019-04-16 ENCOUNTER — Other Ambulatory Visit: Payer: Self-pay

## 2019-04-16 ENCOUNTER — Ambulatory Visit (INDEPENDENT_AMBULATORY_CARE_PROVIDER_SITE_OTHER): Payer: 59 | Admitting: Neurology

## 2019-04-16 DIAGNOSIS — G4719 Other hypersomnia: Secondary | ICD-10-CM

## 2019-04-16 DIAGNOSIS — R0681 Apnea, not elsewhere classified: Secondary | ICD-10-CM

## 2019-04-16 DIAGNOSIS — G4733 Obstructive sleep apnea (adult) (pediatric): Secondary | ICD-10-CM | POA: Diagnosis not present

## 2019-04-16 DIAGNOSIS — R519 Headache, unspecified: Secondary | ICD-10-CM

## 2019-04-16 DIAGNOSIS — R9431 Abnormal electrocardiogram [ECG] [EKG]: Secondary | ICD-10-CM

## 2019-04-16 DIAGNOSIS — E669 Obesity, unspecified: Secondary | ICD-10-CM

## 2019-04-16 DIAGNOSIS — G4720 Circadian rhythm sleep disorder, unspecified type: Secondary | ICD-10-CM

## 2019-04-16 DIAGNOSIS — Z820 Family history of epilepsy and other diseases of the nervous system: Secondary | ICD-10-CM

## 2019-04-23 LAB — HM DIABETES EYE EXAM

## 2019-04-23 NOTE — Progress Notes (Signed)
Patient referred by Dr. Holly Bodily, seen by me on 02/13/19, diagnostic PSG on 03/06/19.   Patient had a CPAP titration study on 04/16/19.  Please call and inform patient that I have entered an order for treatment with positive airway pressure (PAP) treatment for obstructive sleep apnea (OSA). She did well during the latest sleep study with CPAP. We will, therefore, arrange for a machine for home use through a DME (durable medical equipment) company of Her choice; and I will see the patient back in follow-up in about 10 weeks. Please also explain to the patient that I will be looking out for compliance data, which can be downloaded from the machine (stored on an SD card, that is inserted in the machine) or via remote access through a modem, that is built into the machine. At the time of the followup appointment we will discuss sleep study results and how it is going with PAP treatment at home. Please advise patient to bring Her machine at the time of the first FU visit, even though this is cumbersome. Bringing the machine for every visit after that will likely not be needed, but often helps for the first visit to troubleshoot if needed. Please re-enforce the importance of compliance with treatment and the need for [place] to monitor compliance data - often an insurance requirement and actually good feedback for the patient as far as how they are doing.  Also remind patient, that any interim PAP machine or mask issues should be first addressed with the DME company, as they can often help better with technical and mask fit issues. Please ask if patient has a preference regarding DME company.  EKG showed rare PACs, which is not a sinister finding, she can d/w PCP the need for a formal EKG or if she needs to see cardiology. These are extra beats, but no "irregular heartbeat".  Please also make sure, the patient has a follow-up appointment with me in about 10 weeks from the setup date, thanks. May see one of our nurse  practitioners if needed for proper timing of the FU appointment.  Please fax or rout report to the referring provider. Thanks,   Star Age, MD, PhD [facility])

## 2019-04-23 NOTE — Procedures (Signed)
PATIENT'S NAME:  Christina, Schwartz DOB:      [birth year]      MR#:    [id number]     DATE OF RECORDING: 04/16/2019 REFERRING M.D.:  Benny Lennert, MD Study Performed:   CPAP  Titration HISTORY: 48 year old woman with a history of hypertension, diabetes, hypertriglyceridemia, anemia and obesity, who presents for a PAP titration study. Her baseline PSG on 03/06/19 revealed severe obstructive sleep apnea, with a total AHI of 47.5/hour, REM AHI of 78/hour, supine AHI of 54.9/hour and O2 nadir of 78%. The patient endorsed the Epworth Sleepiness Scale at 13 points. The patient's weight 218 pounds with a height of 66 (inches), resulting in a BMI of 35.1 kg/m2. The patient's neck circumference measured 17.8 inches.  CURRENT MEDICATIONS: Tenormin, Hemocyte, Microzide, Geritol, Glucophage  PROCEDURE:  This is a multichannel digital polysomnogram utilizing the SomnoStar 11.2 system.  Electrodes and sensors were applied and monitored per AASM Specifications.   EEG, EOG, Chin and Limb EMG, were sampled at 200 Hz.  ECG, Snore and Nasal Pressure, Thermal Airflow, Respiratory Effort, CPAP Flow and Pressure, Oximetry was sampled at 50 Hz. Digital video and audio were recorded.      The patient was fitted with a Simplus FFM, CPAP was initiated at 5 cmH20 with heated humidity per AASM standards and pressure was advanced to 8 cmH20 because of hypopneas, apneas and desaturations.  At a PAP pressure of 8 cmH20, there was a reduction of the AHI to 0/hour with supine REM sleep achieved and O2 nadir of 92%.   Lights Out was at 21:53 and Lights On at 04:52. Total recording time (TRT) was 419.5 minutes, with a total sleep time (TST) of 371 minutes. The patient's sleep latency was 29.5 minutes. REM latency was 83 minutes, which is normal. The sleep efficiency was 88.4 %.    SLEEP ARCHITECTURE: WASO (Wake after sleep onset) was 26 minutes with minimal to mild sleep fragmentation noted. There were 11.5 minutes in Stage N1, 234 minutes  Stage N2, 73.5 minutes Stage N3 and 52 minutes in Stage REM.  The percentage of Stage N1 was 3.1%, Stage N2 was 63.1%, which is increased, Stage N3 was 19.8%, which is normal, and Stage R (REM sleep) was 14.%, which is mildly reduced. The arousals were noted as: 47 were spontaneous, 0 were associated with PLMs, 1 were associated with respiratory events.  RESPIRATORY ANALYSIS:  There was a total of 3 respiratory events: 2 obstructive apneas, 0 central apneas and 0 mixed apneas with a total of 2 apneas and an apnea index (AI) of .3 /hour. There were 1 hypopneas with a hypopnea index of .2/hour. The patient also had 0 respiratory event related arousals (RERAs).      The total APNEA/HYPOPNEA INDEX  (AHI) was .5 /hour and the total RESPIRATORY DISTURBANCE INDEX was .5 /hour  3 events occurred in REM sleep and 0 events in NREM. The REM AHI was 3.5 /hour versus a non-REM AHI of 0 /hour.  The patient spent 82.5 minutes of total sleep time in the supine position and 289 minutes in non-supine. The supine AHI was 0.0, versus a non-supine AHI of 0.6.  OXYGEN SATURATION & C02:  The baseline 02 saturation was 96%, with the lowest being 90%. Time spent below 89% saturation equaled 0 minutes.  PERIODIC LIMB MOVEMENTS:  The patient had a total of 0 Periodic Limb Movements. The Periodic Limb Movement (PLM) index was 0 and the PLM Arousal index was 0 /hour.  Audio and  video analysis did not show any abnormal or unusual movements, behaviors, phonations or vocalizations. The patient took no bathroom breaks. The EKG was in keeping with normal sinus rhythm with rare PACs noted.  Post-study, the patient indicated that sleep was better than usual.   IMPRESSION: 1. Obstructive Sleep Apnea (OSA) 2. Dysfunctions associated with sleep stages or arousal from sleep 3. Non-specific abnormal EKG   RECOMMENDATIONS: 1. This study demonstrates resolution of the patient's obstructive sleep apnea with CPAP therapy. I will, therefore,  start the patient on home CPAP treatment at a pressure of 8 cm via small FFM with heated humidity. The patient should be reminded to be fully compliant with PAP therapy to improve sleep related symptoms and decrease long term cardiovascular risks. The patient should be reminded, that it may take up to 3 months to get fully used to using PAP with all planned sleep. The earlier full compliance is achieved, the better long term compliance tends to be. Please note that untreated obstructive sleep apnea may carry additional perioperative morbidity. Patients with significant obstructive sleep apnea should receive perioperative PAP therapy and the surgeons and particularly the anesthesiologist should be informed of the diagnosis and the severity of the sleep disordered breathing. 2. The study showed rare PACs on single lead EKG; clinical correlation is recommended and consultation with cardiology may be feasible.  3. This study shows some sleep fragmentation and mildly abnormal sleep stage percentages; these are nonspecific findings and per se do not signify an intrinsic sleep disorder or a cause for the patient's sleep-related symptoms. Causes include (but are not limited to) the first night effect of the sleep study, circadian rhythm disturbances, medication effect or an underlying mood disorder or medical problem.  4. The patient should be cautioned not to drive, work at heights, or operate dangerous or heavy equipment when tired or sleepy. Review and reiteration of good sleep hygiene measures should be pursued with any patient. 5. The patient will be seen in follow-up in the sleep clinic at [facility] for discussion of the test results, symptom and treatment compliance review, further management strategies, etc. The referring provider will be notified of the test results.   I certify that I have reviewed the entire raw data recording prior to the issuance of this report in accordance with the Standards of Accreditation  of the American Academy of Sleep Medicine (AASM)   Star Age, MD, PhD Diplomat, American Board of Neurology and Sleep Medicine (Neurology and Sleep Medicine)

## 2019-04-23 NOTE — Addendum Note (Signed)
Addended by: Star Age on: 04/23/2019 05:20 PM   Modules accepted: Orders

## 2019-04-24 ENCOUNTER — Telehealth: Payer: Self-pay

## 2019-04-24 NOTE — Telephone Encounter (Signed)
-----   Message from Star Age, MD sent at 04/23/2019  5:20 PM EDT ----- Patient referred by Dr. Holly Bodily, seen by me on 02/13/19, diagnostic PSG on 03/06/19.   Patient had a CPAP titration study on 04/16/19.  Please call and inform patient that I have entered an order for treatment with positive airway pressure (PAP) treatment for obstructive sleep apnea (OSA). She did well during the latest sleep study with CPAP. We will, therefore, arrange for a machine for home use through a DME (durable medical equipment) company of Her choice; and I will see the patient back in follow-up in about 10 weeks. Please also explain to the patient that I will be looking out for compliance data, which can be downloaded from the machine (stored on an SD card, that is inserted in the machine) or via remote access through a modem, that is built into the machine. At the time of the followup appointment we will discuss sleep study results and how it is going with PAP treatment at home. Please advise patient to bring Her machine at the time of the first FU visit, even though this is cumbersome. Bringing the machine for every visit after that will likely not be needed, but often helps for the first visit to troubleshoot if needed. Please re-enforce the importance of compliance with treatment and the need for [place] to monitor compliance data - often an insurance requirement and actually good feedback for the patient as far as how they are doing.  Also remind patient, that any interim PAP machine or mask issues should be first addressed with the DME company, as they can often help better with technical and mask fit issues. Please ask if patient has a preference regarding DME company.  EKG showed rare PACs, which is not a sinister finding, she can d/w PCP the need for a formal EKG or if she needs to see cardiology. These are extra beats, but no "irregular heartbeat".  Please also make sure, the patient has a follow-up appointment with me in  about 10 weeks from the setup date, thanks. May see one of our nurse practitioners if needed for proper timing of the FU appointment.  Please fax or rout report to the referring provider. Thanks,   Star Age, MD, PhD [facility])

## 2019-04-24 NOTE — Telephone Encounter (Addendum)
Pt returned my call. I advised pt that Dr. Rexene Alberts reviewed their sleep study results and found that pt did well with a cpap during her latest sleep study. Dr. Rexene Alberts recommends that pt start a cpap at home. I reviewed PAP compliance expectations with the pt. Pt is agreeable to starting a CPAP. I advised pt that an order will be sent to a DME, [facility], and [facility] will call the pt within about one week after they file with the pt's insurance. [facility] will show the pt how to use the machine, fit for masks, and troubleshoot the CPAP if needed. A follow up appt was made for insurance purposes with Amy, NP on 07/30/19 at 9:30am with Amy, NP. Pt verbalized understanding to arrive 15 minutes early and bring their CPAP. I also advised pt of the occasional PACs and recommended that she follow up with her PCP regarding a possible EKG and cardiology referral. A letter with all of this information in it will be mailed to the pt as a reminder. I verified with the pt that the address we have on file is correct. Pt verbalized understanding of results. Pt had no questions at this time but was encouraged to call back if questions arise. I have sent the order to [facility] and have received confirmation that they have received the order.

## 2019-04-24 NOTE — Telephone Encounter (Signed)
I called pt to discuss her sleep study results. No answer, left a message asking her to call me back. 

## 2019-05-07 DIAGNOSIS — G4733 Obstructive sleep apnea (adult) (pediatric): Secondary | ICD-10-CM | POA: Diagnosis not present

## 2019-06-07 DIAGNOSIS — G4733 Obstructive sleep apnea (adult) (pediatric): Secondary | ICD-10-CM | POA: Diagnosis not present

## 2019-06-12 ENCOUNTER — Encounter: Payer: Self-pay | Admitting: Family Medicine

## 2019-07-05 DIAGNOSIS — Z03818 Encounter for observation for suspected exposure to other biological agents ruled out: Secondary | ICD-10-CM | POA: Diagnosis not present

## 2019-07-05 DIAGNOSIS — Z20828 Contact with and (suspected) exposure to other viral communicable diseases: Secondary | ICD-10-CM | POA: Diagnosis not present

## 2019-07-07 ENCOUNTER — Other Ambulatory Visit: Payer: 59

## 2019-07-07 DIAGNOSIS — G4733 Obstructive sleep apnea (adult) (pediatric): Secondary | ICD-10-CM | POA: Diagnosis not present

## 2019-07-08 ENCOUNTER — Ambulatory Visit
Admission: RE | Admit: 2019-07-08 | Discharge: 2019-07-08 | Disposition: A | Discharge status: Other Acute Inpatient Hospital | Payer: 59 | Source: Ambulatory Visit

## 2019-07-08 NOTE — ED Provider Notes (Signed)
Discussed with patient that she needs to be seen in-person due to her fever and lower abd pain.  Patient agrees to come to UC.     Sharion Balloon, NP 07/08/19 1339

## 2019-07-09 ENCOUNTER — Ambulatory Visit
Admission: EM | Admit: 2019-07-09 | Discharge: 2019-07-09 | Disposition: A | Discharge status: Home / Self Care | Payer: 59 | Attending: Physician Assistant | Admitting: Physician Assistant

## 2019-07-09 ENCOUNTER — Other Ambulatory Visit: Payer: Self-pay

## 2019-07-09 DIAGNOSIS — R1032 Left lower quadrant pain: Secondary | ICD-10-CM | POA: Insufficient documentation

## 2019-07-09 DIAGNOSIS — R350 Frequency of micturition: Secondary | ICD-10-CM | POA: Diagnosis not present

## 2019-07-09 LAB — POCT URINALYSIS DIP (MANUAL ENTRY)
Bilirubin, UA: NEGATIVE
Glucose, UA: NEGATIVE mg/dL
Ketones, POC UA: NEGATIVE mg/dL
Leukocytes, UA: NEGATIVE
Nitrite, UA: NEGATIVE
Protein Ur, POC: >=300 mg/dL — AB
Spec Grav, UA: >=1.03 — AB (ref 1.010–1.025)
Urobilinogen, UA: 0.2 E.U./dL
pH, UA: 6.5 (ref 5.0–8.0)

## 2019-07-09 MED ORDER — CEPHALEXIN 500 MG PO CAPS: 500.0000 mg | ORAL_CAPSULE | Freq: Two times a day (BID) | ORAL | 0 refills | Status: DC

## 2019-07-09 MED FILL — CEPHALEXIN 500 MG CAPSULE: 500 | 5 days supply | Qty: 10 | Fill #0

## 2019-07-09 NOTE — ED Triage Notes (Signed)
Pt c/o fever and LLQ pain x5 days. States the LLQ pain is a dull pain after urinating. Fever 101 this am, took tylenol at 7am

## 2019-07-09 NOTE — ED Provider Notes (Signed)
[name]    CSN: [id number] Arrival date & time: 07/09/19  [id number]      History   Chief Complaint Chief Complaint  Patient presents with  . Urinary Tract Infection    HPI Christina Schwartz is a 48 y.o. female.   48 year old female with history of non-insulin-dependent diabetes, hypertension, comes in for 5-day history of left lower quadrant pain, fever, urinary symptoms.  States she has been having urinary frequency.  Denies dysuria, hematuria.  She has left lower quadrant pain that is constant, dull pain.  States usually pain is minimal, but is worse with urination.  Denies pain with bowel movement.  Denies any changes in bowel habits.  Denies diarrhea, constipation.  Denies nausea, vomiting.  She has been having fever, T-max 102, responsive to antipyretics.  Denies URI symptoms such as cough, congestion, sore throat.  Denies shortness of breath, loss of taste or smell.  Has Covid testing pending results. S/p tubal ligation.      Past Medical History:  Diagnosis Date  . Anemia   . Heart murmur   . Hypertension     Patient Active Problem List   Diagnosis Date Noted  . Sleep apnea 02/05/2019  . Class 1 obesity due to excess calories with serious comorbidity and body mass index (BMI) of 34.0 to 34.9 in adult 02/05/2019  . Encounter for wellness examination in adult 02/05/2019  . Type 2 diabetes mellitus without complication, without long-term current use of insulin ([place]) 06/09/2018  . Hypertriglyceridemia 06/09/2018  . Essential hypertension 08/18/2016  . Iron deficiency anemia due to chronic blood loss 08/18/2016  . Fibroids 05/08/2012  . Menorrhagia 04/15/2012    Past Surgical History:  Procedure Laterality Date  . TUBAL LIGATION    . WISDOM TOOTH EXTRACTION      OB History    Gravida  4   Para  4   Term  4   Preterm      AB      Living  4     SAB      TAB      Ectopic      Multiple      Live Births               Home  Medications    Prior to Admission medications   Medication Sig Start Date End Date Taking? Authorizing Provider  atenolol (TENORMIN) 50 MG tablet Take 1 tablet (50 mg total) by mouth daily. 01/01/19   Forrest Moron, MD  cephALEXin (KEFLEX) 500 MG capsule Take 1 capsule (500 mg total) by mouth 2 (two) times daily. 07/09/19   Tasia Catchings, Brendaly Townsel V, PA-C  ferrous fumarate (HEMOCYTE - 106 MG FE) 325 (106 Fe) MG TABS tablet Take 1 tablet (106 mg of iron total) by mouth daily. 11/27/17   Weber, Damaris Hippo, PA-C  glucose blood (ACCU-CHEK ACTIVE STRIPS) test strip Use as instructed 11/18/18   Delia Chimes A, MD  hydrochlorothiazide (MICROZIDE) 12.5 MG capsule Take 1 capsule (12.5 mg total) by mouth 2 (two) times daily. 01/01/19   Forrest Moron, MD  Iron-Vitamins (GERITOL PO) Take 1 tablet by mouth daily.    [provider]  metFORMIN (GLUCOPHAGE XR) 500 MG 24 hr tablet Take 1 tablet (500 mg total) by mouth daily with breakfast. 01/01/19   Forrest Moron, MD    Family History Family History  Problem Relation Age of Onset  . Hypertension Father   . Pulmonary embolism Father   .  Diabetes Maternal Grandmother   . Diabetes Sister   . Lymphoma Sister     Social History Social History   Tobacco Use  . Smoking status: Never Smoker  . Smokeless tobacco: Never Used  Substance Use Topics  . Alcohol use: No  . Drug use: No     Allergies   Patient has no known allergies.   Review of Systems Review of Systems  Reason unable to perform ROS: See HPI as above.     Physical Exam Triage Vital Signs ED Triage Vitals [07/09/19 0906]  Enc Vitals Group     BP 134/86     Pulse Rate 69     Resp 16     Temp 98.4 F (36.9 C)     Temp Source Oral     SpO2 100 %     Weight      Height      Head Circumference      Peak Flow      Pain Score 0     Pain Loc      Pain Edu?      Excl. in [place]?    No data found.  Updated Vital Signs BP 134/86 (BP Location: Left Arm)   Pulse 69   Temp 98.4 F  (36.9 C) (Oral)   Resp 16   SpO2 100%   Physical Exam Constitutional:      General: She is not in acute distress.    Appearance: She is well-developed. She is not ill-appearing, toxic-appearing or diaphoretic.  HENT:     Head: Normocephalic and atraumatic.  Eyes:     Conjunctiva/sclera: Conjunctivae normal.     Pupils: Pupils are equal, round, and reactive to light.  Cardiovascular:     Rate and Rhythm: Normal rate and regular rhythm.     Heart sounds: Normal heart sounds. No murmur. No friction rub. No gallop.   Pulmonary:     Effort: Pulmonary effort is normal.     Breath sounds: Normal breath sounds. No wheezing or rales.  Abdominal:     General: Bowel sounds are normal.     Palpations: Abdomen is soft.     Tenderness: There is no abdominal tenderness. There is no right CVA tenderness, left CVA tenderness, guarding or rebound.  Skin:    General: Skin is warm and dry.  Neurological:     Mental Status: She is alert and oriented to person, place, and time.  Psychiatric:        Behavior: Behavior normal.        Judgment: Judgment normal.      UC Treatments / Results  Labs (all labs ordered are listed, but only abnormal results are displayed) Labs Reviewed  POCT URINALYSIS DIP (MANUAL ENTRY) - Abnormal; Notable for the following components:      Result Value   Spec Grav, UA >=1.030 (*)    Blood, UA trace-intact (*)    Protein Ur, POC >=300 (*)    All other components within normal limits  URINE CULTURE    EKG   Radiology No results found.  Procedures Procedures (including critical care time)  Medications Ordered in UC Medications - No data to display  Initial Impression / Assessment and Plan / UC Course  I have reviewed the triage vital signs and the nursing notes.  Pertinent labs & imaging results that were available during my care of the patient were reviewed by me and considered in my medical decision making (see chart for details).  Patient with  out acute distress, nontoxic in appearance. Patient abdomen soft, +BS, nontender to palpation.  Negative CVA tenderness.  Dipstick shows increased specific gravity, trace blood, protein. ? Renal colic. Will cover for UTI with keflex despite absence of nitrite, leukocytes given fever.  Will send for urine culture.  Offered Flomax for possible urethral stone, patient declined at this time.  Will push fluids and monitor closely.  Patient to follow-up with PCP if symptoms not improving.  Strict return precautions given.  Patient expresses understanding and agrees to plan.  Final Clinical Impressions(s) / UC Diagnoses   Final diagnoses:  LLQ pain  Urinary frequency   ED Prescriptions    Medication Sig Dispense Auth. Provider   cephALEXin (KEFLEX) 500 MG capsule Take 1 capsule (500 mg total) by mouth 2 (two) times daily. 10 capsule Ok Edwards, PA-C     PDMP not reviewed this encounter.   Ok Edwards, PA-C 07/09/19 [phone]

## 2019-07-09 NOTE — Discharge Instructions (Addendum)
Continue to quarantine until COVID results return. As discussed, urine did not show bacteria, however given your fever and urinary symptoms, we are covering you for an infection with keflex. This may also be caused by kidney stones. Keep hydrated, urine should be clear to pale yellow in color. Follow up with PCP if symptoms not improving for retesting. Go to the ED if pain significantly worse, nausea/vomiting.

## 2019-07-10 LAB — URINE CULTURE: Culture: <10000 — AB

## 2019-07-13 ENCOUNTER — Telehealth: Payer: 59

## 2019-07-14 ENCOUNTER — Other Ambulatory Visit: Payer: Self-pay | Admitting: Family Medicine

## 2019-07-14 ENCOUNTER — Other Ambulatory Visit: Payer: Self-pay

## 2019-07-14 ENCOUNTER — Encounter: Payer: Self-pay | Admitting: Family Medicine

## 2019-07-14 ENCOUNTER — Ambulatory Visit: Payer: 59 | Admitting: Family Medicine

## 2019-07-14 ENCOUNTER — Ambulatory Visit (INDEPENDENT_AMBULATORY_CARE_PROVIDER_SITE_OTHER): Payer: 59

## 2019-07-14 VITALS — BP 140/80 | HR 85 | Temp 98.1°F | Wt 218.4 lb

## 2019-07-14 DIAGNOSIS — R1032 Left lower quadrant pain: Secondary | ICD-10-CM

## 2019-07-14 DIAGNOSIS — R319 Hematuria, unspecified: Secondary | ICD-10-CM | POA: Diagnosis not present

## 2019-07-14 DIAGNOSIS — R197 Diarrhea, unspecified: Secondary | ICD-10-CM | POA: Diagnosis not present

## 2019-07-14 DIAGNOSIS — I10 Essential (primary) hypertension: Secondary | ICD-10-CM

## 2019-07-14 DIAGNOSIS — R350 Frequency of micturition: Secondary | ICD-10-CM | POA: Diagnosis not present

## 2019-07-14 LAB — POCT CBC
Granulocyte percent: 65.4 %G (ref 37–80)
HCT, POC: 38.4 % (ref 29–41)
Hemoglobin: 12 g/dL (ref 11–14.6)
Lymph, poc: 1.3 (ref 0.6–3.4)
MCH, POC: 24.4 pg — AB (ref 27–31.2)
MCHC: 32.4 g/dL (ref 31.8–35.4)
MCV: 75.3 fL — AB (ref 76–111)
MID (cbc): 0.2 (ref 0–0.9)
MPV: 8.4 fL (ref 0–99.8)
POC Granulocyte: 2.9 (ref 2–6.9)
POC LYMPH PERCENT: 29.2 %L (ref 10–50)
POC MID %: 5.4 %M (ref 0–12)
Platelet Count, POC: 209 10*3/uL (ref 142–424)
RBC: 5.1 M/uL (ref 4.04–5.48)
RDW, POC: 16.5 %
WBC: 4.4 10*3/uL — AB (ref 4.6–10.2)

## 2019-07-14 LAB — POCT URINALYSIS DIP (MANUAL ENTRY)
Bilirubin, UA: NEGATIVE
Glucose, UA: NEGATIVE mg/dL
Ketones, POC UA: NEGATIVE mg/dL
Leukocytes, UA: NEGATIVE
Nitrite, UA: NEGATIVE
Protein Ur, POC: >=300 mg/dL — AB
Spec Grav, UA: 1.025 (ref 1.010–1.025)
Urobilinogen, UA: 0.2 E.U./dL
pH, UA: 7 (ref 5.0–8.0)

## 2019-07-14 LAB — POC MICROSCOPIC URINALYSIS (UMFC): Mucus: ABSENT

## 2019-07-14 MED FILL — HYDROCHLOROTHIAZIDE 12.5 MG: 12.5 | 90 days supply | Qty: 180 | Fill #0

## 2019-07-14 MED FILL — ATENOLOL 50 MG TABLET: 50 | 90 days supply | Qty: 90 | Fill #2

## 2019-07-14 NOTE — Patient Instructions (Signed)
° ° ° °  If you have lab work done today you will be contacted with your lab results within the next 2 weeks.  If you have not heard from us then please contact us. The fastest way to get your results is to register for My Chart. ° ° °IF you received an x-ray today, you will receive an invoice from [facility]. Please contact [facility] at [phone] with questions or concerns regarding your invoice.  ° °IF you received labwork today, you will receive an invoice from [facility]. Please contact [facility] at [phone] with questions or concerns regarding your invoice.  ° °Our billing staff will not be able to assist you with questions regarding bills from these companies. ° °You will be contacted with the lab results as soon as they are available. The fastest way to get your results is to activate your My Chart account. Instructions are located on the last page of this paperwork. If you have not heard from us regarding the results in 2 weeks, please contact this office. °  ° ° ° °

## 2019-07-14 NOTE — Progress Notes (Signed)
1/4/20214:05 PM  Durel Salts 1971-04-24, 49 y.o., female [id number]  Chief Complaint  Patient presents with  . Urinary Tract Infection    Patient was seen at Urgent care on 07/09/19 and was given abx but iss no better and still having symptoms    HPI:   Patient is a 49 y.o. female with past medical history significant for DM2, HTN who presents today for LLQ pain and urinary frequency  Seen at [facility] on Jul 09 2019 Urine cx negative tx with keflex  Started with fever on christmas day, negative covid Started having Left flank/lower abd pain and mild intermittent dysuria with frequency No gross hematuria or vaginal discharge No nausea or vomiting She has been eating less as not feeling well Has been having diarrhea for past several days, 3-4 stools a day No black tarry stools, no bright red blood Denies any history of kidney stones or diverticulitis She reports general malaise, last APAP this morning Tm 102, several days after xmas. Last fever this morning, 100.5 She has intermittent mild nasal congestion and cough for past several days but non productive and no SOB No headaches or sore throat No loss of taste or smell Works in the hospital Currently on [facility]  Depression screen [facility] 2/9 07/14/2019 02/05/2019 01/01/2019  Decreased Interest 0 0 0  Down, Depressed, Hopeless 0 0 0  PHQ - 2 Score 0 0 0    Fall Risk  07/14/2019 02/05/2019 01/01/2019 01/01/2019 05/31/2018  Falls in the past year? 0 0 0 0 0  Number falls in past yr: 0 0 0 0 -  Injury with Fall? 0 0 0 0 -  Follow up Falls evaluation completed Falls evaluation completed Falls evaluation completed Falls evaluation completed -     No Known Allergies  Prior to Admission medications   Medication Sig Start Date End Date Taking? Authorizing Provider  atenolol (TENORMIN) 50 MG tablet Take 1 tablet (50 mg total) by mouth daily. 01/01/19  Yes Stallings, Zoe A, MD  ferrous fumarate (HEMOCYTE - 106 MG FE) 325 (106 Fe) MG TABS  tablet Take 1 tablet (106 mg of iron total) by mouth daily. 11/27/17  Yes Weber, Sarah L, PA-C  glucose blood (ACCU-CHEK ACTIVE STRIPS) test strip Use as instructed 11/18/18  Yes Stallings, Zoe A, MD  hydrochlorothiazide (MICROZIDE) 12.5 MG capsule TAKE 1 CAPSULE (12.5 MG TOTAL) BY MOUTH 2 (TWO) TIMES DAILY. 07/14/19  Yes Stallings, Zoe A, MD  Iron-Vitamins (GERITOL PO) Take 1 tablet by mouth daily.   Yes [provider]  metFORMIN (GLUCOPHAGE XR) 500 MG 24 hr tablet Take 1 tablet (500 mg total) by mouth daily with breakfast. 01/01/19  Yes Forrest Moron, MD    Past Medical History:  Diagnosis Date  . Anemia   . Heart murmur   . Hypertension     Past Surgical History:  Procedure Laterality Date  . TUBAL LIGATION    . WISDOM TOOTH EXTRACTION      Social History   Tobacco Use  . Smoking status: Never Smoker  . Smokeless tobacco: Never Used  Substance Use Topics  . Alcohol use: No    Family History  Problem Relation Age of Onset  . Hypertension Father   . Pulmonary embolism Father   . Diabetes Maternal Grandmother   . Diabetes Sister   . Lymphoma Sister     Review of Systems  Constitutional: Negative for chills and fever.  Respiratory: Negative for cough and shortness of breath.  Cardiovascular: Negative for chest pain, palpitations and leg swelling.  Gastrointestinal: Negative for abdominal pain, nausea and vomiting.     OBJECTIVE:  Today's Vitals   07/14/19 1543  BP: 140/80  Pulse: 85  Temp: 98.1 F (36.7 C)  TempSrc: Temporal  SpO2: 97%  Weight: 218 lb 6.4 oz (99.1 kg)   Body mass index is 35.25 kg/m.   Physical Exam Vitals and nursing note reviewed.  Constitutional:      Appearance: She is well-developed. She is ill-appearing (mild). She is not toxic-appearing.  HENT:     Head: Normocephalic and atraumatic.     Mouth/Throat:     Pharynx: No oropharyngeal exudate.  Eyes:     General: No scleral icterus.    Conjunctiva/sclera: Conjunctivae  normal.     Pupils: Pupils are equal, round, and reactive to light.  Cardiovascular:     Rate and Rhythm: Normal rate and regular rhythm.     Heart sounds: Normal heart sounds. No murmur. No friction rub. No gallop.   Pulmonary:     Effort: Pulmonary effort is normal.     Breath sounds: Normal breath sounds. No wheezing, rhonchi or rales.  Abdominal:     General: Bowel sounds are normal. There is no distension.     Palpations: Abdomen is soft.     Tenderness: There is no abdominal tenderness. There is no right CVA tenderness or left CVA tenderness.  Musculoskeletal:     Cervical back: Neck supple.  Skin:    General: Skin is warm and dry.  Neurological:     Mental Status: She is alert and oriented to person, place, and time.     Results for orders placed or performed in visit on 07/14/19 (from the past 24 hour(s))  POCT urinalysis dipstick     Status: Abnormal   Collection Time: 07/14/19  4:16 PM  Result Value Ref Range   Color, UA yellow yellow   Clarity, UA clear clear   Glucose, UA negative negative mg/dL   Bilirubin, UA negative negative   Ketones, POC UA negative negative mg/dL   Spec Grav, UA 1.025 1.010 - 1.025   Blood, UA trace-intact (A) negative   pH, UA 7.0 5.0 - 8.0   Protein Ur, POC >=300 (A) negative mg/dL   Urobilinogen, UA 0.2 0.2 or 1.0 E.U./dL   Nitrite, UA Negative Negative   Leukocytes, UA Negative Negative  POCT CBC     Status: Abnormal   Collection Time: 07/14/19  4:41 PM  Result Value Ref Range   WBC 4.4 (A) 4.6 - 10.2 K/uL   Lymph, poc 1.3 0.6 - 3.4   POC LYMPH PERCENT 29.2 10 - 50 %L   MID (cbc) 0.2 0 - 0.9   POC MID % 5.4 0 - 12 %M   POC Granulocyte 2.9 2 - 6.9   Granulocyte percent 65.4 37 - 80 %G   RBC 5.10 4.04 - 5.48 M/uL   Hemoglobin 12 11 - 14.6 g/dL   HCT, POC 38.4 29 - 41 %   MCV 75.3 (A) 76 - 111 fL   MCH, POC 24.4 (A) 27 - 31.2 pg   MCHC 32.4 31.8 - 35.4 g/dL   RDW, POC 16.5 %   Platelet Count, POC 209 142 - 424 K/uL   MPV 8.4  0 - 99.8 fL  POCT Urinalysis Microscopic ([facility]FC)     Status: Abnormal   Collection Time: 07/14/19  4:44 PM  Result Value Ref Range   WBC,UR,HPF,POC  None None WBC/hpf   RBC,UR,HPF,POC None None RBC/hpf   Bacteria None None, Too numerous to count   Mucus Absent Absent   Epithelial Cells, UR Per Microscopy Few (A) None, Too numerous to count cells/hpf     DG Abd 2 Views  Result Date: 07/14/2019 CLINICAL DATA:  Flank pain and hematuria. EXAM: ABDOMEN - 2 VIEW COMPARISON:  CT dated 03/15/2013. FINDINGS: Extensive cholelithiasis is noted in the right upper quadrant. There are no radiopaque stones projecting over either kidney. There is a moderate stool burden. Multiple presumed phleboliths project over the patient's pelvis. IMPRESSION: 1. Cholelithiasis in the right upper quadrant. No radiopaque stones projecting over either kidney. 2. Moderate stool burden. 3. Multiple presumed phleboliths project over the patient's pelvis. Electronically Signed   By: Constance Holster M.D.   On: 07/14/2019 16:33     ASSESSMENT and PLAN  1. Urinary frequency - POCT urinalysis dipstick - POCT Urinalysis Microscopic ([facility])  2. LLQ abdominal pain - DG Abd 2 Views; Future - POCT CBC - Comprehensive metabolic panel  3. Diarrhea of presumed infectious origin - Stool culture - C Diff by PCR  Patient with fevers, diarrhea, urgency, mild leukopenia. PE, UA and AXR unremarkable. Stool studies pending. Favor viral process. Discussed supportive measures. RTC precautions reviewed.  Return if symptoms worsen or fail to improve.    Rutherford Guys, MD Primary Care at [place], [place] [address] Ph.  [phone] Fax [phone]

## 2019-07-15 DIAGNOSIS — R197 Diarrhea, unspecified: Secondary | ICD-10-CM | POA: Diagnosis not present

## 2019-07-15 LAB — COMPREHENSIVE METABOLIC PANEL
ALT: 31 IU/L (ref 0–32)
AST: 35 IU/L (ref 0–40)
Albumin/Globulin Ratio: 1.3 (ref 1.2–2.2)
Albumin: 4.1 g/dL (ref 3.8–4.8)
Alkaline Phosphatase: 61 IU/L (ref 39–117)
BUN/Creatinine Ratio: 12 (ref 9–23)
BUN: 10 mg/dL (ref 6–24)
Bilirubin Total: 0.5 mg/dL (ref 0.0–1.2)
CO2: 26 mmol/L (ref 20–29)
Calcium: 9 mg/dL (ref 8.7–10.2)
Chloride: 98 mmol/L (ref 96–106)
Creatinine, Ser: 0.82 mg/dL (ref 0.57–1.00)
GFR calc Af Amer: 98 mL/min/{1.73_m2} (ref >59)
GFR calc non Af Amer: 85 mL/min/{1.73_m2} (ref >59)
Globulin, Total: 3.1 g/dL (ref 1.5–4.5)
Glucose: 159 mg/dL — ABNORMAL HIGH (ref 65–99)
Potassium: 3.6 mmol/L (ref 3.5–5.2)
Sodium: 139 mmol/L (ref 134–144)
Total Protein: 7.2 g/dL (ref 6.0–8.5)

## 2019-07-16 LAB — CLOSTRIDIUM DIFFICILE BY PCR: Toxigenic C. Difficile by PCR: NEGATIVE

## 2019-07-19 LAB — STOOL CULTURE: E coli, Shiga toxin Assay: NEGATIVE

## 2019-07-30 ENCOUNTER — Ambulatory Visit (INDEPENDENT_AMBULATORY_CARE_PROVIDER_SITE_OTHER): Payer: 59 | Admitting: Family Medicine

## 2019-07-30 ENCOUNTER — Encounter: Payer: Self-pay | Admitting: Family Medicine

## 2019-07-30 ENCOUNTER — Other Ambulatory Visit: Payer: Self-pay

## 2019-07-30 VITALS — BP 132/86 | HR 69 | Temp 97.1°F | Ht 66.0 in | Wt 218.6 lb

## 2019-07-30 DIAGNOSIS — G4733 Obstructive sleep apnea (adult) (pediatric): Secondary | ICD-10-CM | POA: Diagnosis not present

## 2019-07-30 DIAGNOSIS — Z9989 Dependence on other enabling machines and devices: Secondary | ICD-10-CM

## 2019-07-30 NOTE — Patient Instructions (Signed)
Continue CPAP nightly and greater than 4 hours each night   Follow up in 1 year, sooner if needed   Sleep Apnea Sleep apnea affects breathing during sleep. It causes breathing to stop for a short time or to become shallow. It can also increase the risk of:  Heart attack.  Stroke.  Being very overweight (obese).  Diabetes.  Heart failure.  Irregular heartbeat. The goal of treatment is to help you breathe normally again. What are the causes? There are three kinds of sleep apnea:  Obstructive sleep apnea. This is caused by a blocked or collapsed airway.  Central sleep apnea. This happens when the brain does not send the right signals to the muscles that control breathing.  Mixed sleep apnea. This is a combination of obstructive and central sleep apnea. The most common cause of this condition is a collapsed or blocked airway. This can happen if:  Your throat muscles are too relaxed.  Your tongue and tonsils are too large.  You are overweight.  Your airway is too small. What increases the risk?  Being overweight.  Smoking.  Having a small airway.  Being older.  Being female.  Drinking alcohol.  Taking medicines to calm yourself (sedatives or tranquilizers).  Having family members with the condition. What are the signs or symptoms?  Trouble staying asleep.  Being sleepy or tired during the day.  Getting angry a lot.  Loud snoring.  Headaches in the morning.  Not being able to focus your mind (concentrate).  Forgetting things.  Less interest in sex.  Mood swings.  Personality changes.  Feelings of sadness (depression).  Waking up a lot during the night to pee (urinate).  Dry mouth.  Sore throat. How is this diagnosed?  Your medical history.  A physical exam.  A test that is done when you are sleeping (sleep study). The test is most often done in a sleep lab but may also be done at home. How is this treated?   Sleeping on your  side.  Using a medicine to get rid of mucus in your nose (decongestant).  Avoiding the use of alcohol, medicines to help you relax, or certain pain medicines (narcotics).  Losing weight, if needed.  Changing your diet.  Not smoking.  Using a machine to open your airway while you sleep, such as: ? An oral appliance. This is a mouthpiece that shifts your lower jaw forward. ? A CPAP device. This device blows air through a mask when you breathe out (exhale). ? An EPAP device. This has valves that you put in each nostril. ? A BPAP device. This device blows air through a mask when you breathe in (inhale) and breathe out.  Having surgery if other treatments do not work. It is important to get treatment for sleep apnea. Without treatment, it can lead to:  High blood pressure.  Coronary artery disease.  In men, not being able to have an erection (impotence).  Reduced thinking ability. Follow these instructions at home: Lifestyle  Make changes that your doctor recommends.  Eat a healthy diet.  Lose weight if needed.  Avoid alcohol, medicines to help you relax, and some pain medicines.  Do not use any products that contain nicotine or tobacco, such as cigarettes, e-cigarettes, and chewing tobacco. If you need help quitting, ask your doctor. General instructions  Take over-the-counter and prescription medicines only as told by your doctor.  If you were given a machine to use while you sleep, use it only   as told by your doctor.  If you are having surgery, make sure to tell your doctor you have sleep apnea. You may need to bring your device with you.  Keep all follow-up visits as told by your doctor. This is important. Contact a doctor if:  The machine that you were given to use during sleep bothers you or does not seem to be working.  You do not get better.  You get worse. Get help right away if:  Your chest hurts.  You have trouble breathing in enough air.  You have  an uncomfortable feeling in your back, arms, or stomach.  You have trouble talking.  One side of your body feels weak.  A part of your face is hanging down. These symptoms may be an emergency. Do not wait to see if the symptoms will go away. Get medical help right away. Call your local emergency services (911 in the [place] Do not drive yourself to the hospital. Summary  This condition affects breathing during sleep.  The most common cause is a collapsed or blocked airway.  The goal of treatment is to help you breathe normally while you sleep. This information is not intended to replace advice given to you by your health care provider. Make sure you discuss any questions you have with your health care provider. Document Revised: 04/12/2018 Document Reviewed: 02/19/2018 Elsevier Patient Education  2020 Elsevier Inc.  

## 2019-07-30 NOTE — Progress Notes (Addendum)
PATIENT: Christina Schwartz DOB: 1971/05/02  REASON FOR VISIT: follow up HISTORY FROM: patient  Chief Complaint  Patient presents with  . Follow-up    Initial cpap f/u. Alone. Rm 1. No new concerns at this time.      HISTORY OF PRESENT ILLNESS: Today 07/30/19 Christina Schwartz is a 49 y.o. female here today for follow up for OSA on CPAP.  Sleep study revealed severe OSA with AHI of 47.5/h and REM AHI of 78/h. O2 nadir of 78%.  She returns today for her initial CPAP review.  She reports that she is doing very well on CPAP therapy.  She denies any difficulty with her machine.  She is currently using a fullface mask but is interested in trying nasal pillows.  No difficulty with current mask but that she might prefer a nasal pillow.  She has noted significant improvements in sleep quality and daytime energy levels.  Compliance report dated 06/29/2019 through 07/28/2019 reveals that she used CPAP 30 out of the last 30 days for compliance of 100%.  28 of the last 30 days she used CPAP greater than 4 hours for compliance of 93%.  Average usage was 6 hours and 49 minutes.  Residual AHI was 3.5 on a set pressure of 8 cm of water and an EPR of 3.  There was no significant leak noted.  HISTORY: (copied from Dr Guadelupe Sabin note on 02/13/2019)  Dear Dr. Holly Bodily,   I saw your patient, Jaquline Curl, upon your kind request in my sleep clinic today for initial consultation of her sleep disturbance, in particular, concern for underlying obstructive sleep apnea.  The patient is unaccompanied today.  As you know, Ms. Perra is a 49 year old right-handed woman with an underlying medical history of hypertension, diabetes, hypertriglyceridemia, anemia and obesity, who reports snoring and excessive daytime somnolence.  She has a family history of sleep apnea.  I reviewed your office note from 02/05/2019. Her Epworth sleepiness score is 13 out of 24, fatigue severity score is 48 out of 63.  She is typically in bed between  930 and 10 and rise time is around 6.  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  She lives alone and for grown children.  She has no pets in the household.  Recently when she stayed at her mother's house, her sister-in-law noticed apneas while patient was asleep.  They shared a room at the time.  Her brother has sleep apnea and uses a CPAP machine as well as her sister and she also reports that her father had sleep apnea, he passed away at age 31.  She has rare morning headaches, no night to night nocturia.  She denies any telltale symptoms of restless leg syndrome.  She would be willing to get tested for sleep apnea and consider CPAP therapy.  She is trying to lose weight through weight watchers and has lost 5 pounds thus far.  She is a non-smoker and does not drink alcohol and drinks caffeine and limitation, occasional soda, no daily coffee or tea.   REVIEW OF SYSTEMS: Out of a complete 14 system review of symptoms, the patient complains only of the following symptoms, none and all other reviewed systems are negative.  Epworth sleepiness scale: 1 Fatigue severity scale: 9  ALLERGIES: No Known Allergies  HOME MEDICATIONS: Outpatient Medications Prior to Visit  Medication Sig Dispense Refill  . atenolol (TENORMIN) 50 MG tablet Take 1 tablet (50 mg total) by  mouth daily. 90 tablet 3  . ferrous fumarate (HEMOCYTE - 106 MG FE) 325 (106 Fe) MG TABS tablet Take 1 tablet (106 mg of iron total) by mouth daily. 90 each 4  . glucose blood (ACCU-CHEK ACTIVE STRIPS) test strip Use as instructed 100 each 0  . hydrochlorothiazide (MICROZIDE) 12.5 MG capsule TAKE 1 CAPSULE (12.5 MG TOTAL) BY MOUTH 2 (TWO) TIMES DAILY. 180 capsule 1  . Iron-Vitamins (GERITOL PO) Take 1 tablet by mouth daily.    . metFORMIN (GLUCOPHAGE XR) 500 MG 24 hr tablet Take 1 tablet (500 mg total) by mouth daily with breakfast. 90 tablet 1   No facility-administered medications prior to visit.     PAST MEDICAL HISTORY: Past Medical History:  Diagnosis Date  . Anemia   . Heart murmur   . Hypertension     PAST SURGICAL HISTORY: Past Surgical History:  Procedure Laterality Date  . TUBAL LIGATION    . WISDOM TOOTH EXTRACTION      FAMILY HISTORY: Family History  Problem Relation Age of Onset  . Hypertension Father   . Pulmonary embolism Father   . Diabetes Maternal Grandmother   . Diabetes Sister   . Lymphoma Sister     SOCIAL HISTORY: Social History   Socioeconomic History  . Marital status: Legally Separated    Spouse name: bruce  . Number of children: 4  . Years of education: some college  . Highest education level: Not on file  Occupational History  . Occupation: Child psychotherapist: [employer]  Tobacco Use  . Smoking status: Never Smoker  . Smokeless tobacco: Never Used  Substance and Sexual Activity  . Alcohol use: No  . Drug use: No  . Sexual activity: Yes    Partners: Male    Birth control/protection: None, Surgical    Comment: BTL  Other Topics Concern  . Not on file  Social History Narrative   Lives with 4 children.      No gums in home.   Wears seatbelt.   Social Determinants of Health   Financial Resource Strain:   . Difficulty of Paying Living Expenses: Not on file  Food Insecurity:   . Worried About Charity fundraiser in the Last Year: Not on file  . Ran Out of Food in the Last Year: Not on file  Transportation Needs:   . Lack of Transportation (Medical): Not on file  . Lack of Transportation (Non-Medical): Not on file  Physical Activity:   . Days of Exercise per Week: Not on file  . Minutes of Exercise per Session: Not on file  Stress:   . Feeling of Stress : Not on file  Social Connections:   . Frequency of Communication with Friends and Family: Not on file  . Frequency of Social Gatherings with Friends and Family: Not on file  . Attends Religious Services: Not on file  . Active Member of Clubs or  Organizations: Not on file  . Attends Archivist Meetings: Not on file  . Marital Status: Not on file  Intimate Partner Violence:   . Fear of Current or Ex-Partner: Not on file  . Emotionally Abused: Not on file  . Physically Abused: Not on file  . Sexually Abused: Not on file      PHYSICAL EXAM  Vitals:   07/30/19 0915  BP: 132/86  Pulse: 69  Temp: (!) 97.1 F (36.2 C)  TempSrc: Oral  Weight: 218 lb 9.6 oz (99.2  kg)  Height: 5\' 6"  (1.676 m)   Body mass index is 35.28 kg/m.  Generalized: Well developed, in no acute distress  Cardiology: normal rate and rhythm, no murmur noted Respiratory: Clear to auscultation bilaterally Neurological examination  Mentation: Alert oriented to time, place, history taking. Follows all commands speech and language fluent Cranial nerve II-XII: Pupils were equal round reactive to light. Extraocular movements were full, visual field were full  Motor: The motor testing reveals 5 over 5 strength of all 4 extremities. Good symmetric motor tone is noted throughout.  Gait and station: Gait is normal.   DIAGNOSTIC DATA (LABS, IMAGING, TESTING) - I reviewed patient records, labs, notes, testing and imaging myself where available.  No flowsheet data found.   Lab Results  Component Value Date   WBC 4.4 (A) 07/14/2019   HGB 12 07/14/2019   HCT 38.4 07/14/2019   MCV 75.3 (A) 07/14/2019   PLT 288 01/01/2019      Component Value Date/Time   NA 139 07/14/2019 1621   K 3.6 07/14/2019 1621   CL 98 07/14/2019 1621   CO2 26 07/14/2019 1621   GLUCOSE 159 (H) 07/14/2019 1621   GLUCOSE 119 (H) 07/22/2015 2002   BUN 10 07/14/2019 1621   CREATININE 0.82 07/14/2019 1621   CALCIUM 9.0 07/14/2019 1621   PROT 7.2 07/14/2019 1621   ALBUMIN 4.1 07/14/2019 1621   AST 35 07/14/2019 1621   ALT 31 07/14/2019 1621   ALKPHOS 61 07/14/2019 1621   BILITOT 0.5 07/14/2019 1621   GFRNONAA 85 07/14/2019 1621   GFRAA 98 07/14/2019 1621   Lab Results   Component Value Date   CHOL 152 01/01/2019   HDL 45 01/01/2019   LDLCALC 81 01/01/2019   TRIG 128 01/01/2019   CHOLHDL 3.4 01/01/2019   Lab Results  Component Value Date   HGBA1C 6.9 (A) 01/01/2019   Lab Results  Component Value Date   [id number] 05/31/2018   Lab Results  Component Value Date   TSH 0.849 11/27/2017       ASSESSMENT AND PLAN 49 y.o. year old female  has a past medical history of Anemia, Heart murmur, and Hypertension. here with     ICD-10-CM   1. OSA on CPAP  G47.33 For home use only DME continuous positive airway pressure (CPAP)   Z99.89     Lale was doing very well on CPAP therapy.  She reports significant improvement in sleep quality and daytime energy.  Compliance report reveals excellent compliance.  She was encouraged to continue using CPAP nightly and for greater than 4 hours each night.  We will send a new order today for supplies and consideration for a nasal pillow mask.  She will monitor her CPAP machine and ensure adequate mask seal.  She was instructed to call me with any difficulty.  She will follow-up with me in 1 year, sooner if needed.  She verbalizes understanding and agreement with this plan.     Orders Placed This Encounter  Procedures  . For home use only DME continuous positive airway pressure (CPAP)    Supplies, would like to consider nasal pillow    Order Specific Question:   Length of Need    Answer:   Lifetime    Order Specific Question:   Patient has OSA or probable OSA    Answer:   Yes    Order Specific Question:   Is the patient currently using CPAP in the home    Answer:  Yes    Order Specific Question:   Settings    Answer:   Other see comments    Order Specific Question:   CPAP supplies needed    Answer:   Mask, headgear, cushions, filters, heated tubing and water chamber     No orders of the defined types were placed in this encounter.     I spent 15 minutes with the patient. 50% of this time was spent  counseling and educating patient on plan of care and medications.    Debbora Presto, FNP-C 07/30/2019, 9:59 AM [facility] [address], [place], [place] [address] [phone]  I reviewed the above note and documentation by the Nurse Practitioner and agree with the history, exam, assessment and plan as outlined above. I was available for consultation. Star Age, MD, PhD [facility])

## 2019-08-07 DIAGNOSIS — G4733 Obstructive sleep apnea (adult) (pediatric): Secondary | ICD-10-CM | POA: Diagnosis not present

## 2019-08-08 DIAGNOSIS — G4733 Obstructive sleep apnea (adult) (pediatric): Secondary | ICD-10-CM | POA: Diagnosis not present

## 2019-09-07 DIAGNOSIS — G4733 Obstructive sleep apnea (adult) (pediatric): Secondary | ICD-10-CM | POA: Diagnosis not present

## 2019-09-08 DIAGNOSIS — G4733 Obstructive sleep apnea (adult) (pediatric): Secondary | ICD-10-CM | POA: Diagnosis not present

## 2019-09-12 ENCOUNTER — Other Ambulatory Visit: Payer: Self-pay | Admitting: Family Medicine

## 2019-09-12 DIAGNOSIS — E119 Type 2 diabetes mellitus without complications: Secondary | ICD-10-CM

## 2019-09-12 NOTE — Telephone Encounter (Signed)
Requested Prescriptions  Pending Prescriptions Disp Refills  . ACCU-CHEK GUIDE test strip [Pharmacy Med Name: ACCU-CHEK GUIDE STRP Strip] 100 strip 0    Sig: USE AS INSTRUCTED     Endocrinology: Diabetes - Testing Supplies Passed - 09/12/2019  3:16 PM      Passed - Valid encounter within last 12 months    Recent Outpatient Visits          2 months ago Urinary frequency   Primary Care at [facility], Lilia Argue, MD   7 months ago Type 2 diabetes mellitus without complication, without long-term current use of insulin [facility])   Primary Care at [facility], Rex Kras, MD   8 months ago Type 2 diabetes mellitus without complication, without long-term current use of insulin ([place])   Primary Care at [facility], Zoe A, MD   1 year ago Type 2 diabetes mellitus without complication, without long-term current use of insulin [facility])   Primary [facility] at [facility], Arlie Solomons, MD   1 year ago Annual physical exam   Primary Care at [place], PA-C

## 2019-09-19 ENCOUNTER — Other Ambulatory Visit: Payer: Self-pay

## 2019-09-19 MED FILL — FREESTYLE LITE TEST STRIP: 50 days supply | Qty: 50 | Fill #0

## 2019-09-19 MED FILL — FREESTYLE LANCETS: 90 days supply | Qty: 100 | Fill #0

## 2019-09-19 MED FILL — FREESTYLE LITE METER: 30 days supply | Qty: 1 | Fill #0

## 2019-10-23 MED FILL — ATENOLOL 50 MG TABLET: 50 | 90 days supply | Qty: 90 | Fill #3

## 2019-10-23 MED FILL — HYDROCHLOROTHIAZIDE 12.5 MG: 12.5 | 90 days supply | Qty: 180 | Fill #1

## 2019-11-25 ENCOUNTER — Telehealth: Payer: Self-pay | Admitting: Family Medicine

## 2019-11-25 DIAGNOSIS — Z9989 Dependence on other enabling machines and devices: Secondary | ICD-10-CM

## 2019-11-25 DIAGNOSIS — G4733 Obstructive sleep apnea (adult) (pediatric): Secondary | ICD-10-CM

## 2019-11-25 NOTE — Telephone Encounter (Signed)
Lucretia Roers, RN; Christina Schwartz; Rockne Coons, Christina Schwartz, as long as she has a Armed forces training and education officer ([place], [place], Airline pilot, Social research officer, government), we can use the order from Jan. If she has a Medicaid or any type of Medicare policy, we would need new Rx.     Previous Messages   ----- Message -----  From: Brandon Melnick, RN  Sent: 11/25/2019  1:15 PM EDT  To: Christina Schwartz, Christina Schwartz  Subject: cpap                       Christina Schwartz  Female, 49 y.o., May 03, 1971  Pt called for new cpap order, she has on from 07-31-19. Does she need new one?? Let me know. [facility] RN

## 2019-11-25 NOTE — Telephone Encounter (Signed)
I sent [facility] message about needing new supplies.  (per pt they relayed that she needed a new order).

## 2019-11-25 NOTE — Telephone Encounter (Signed)
Pt called to advise she was informed a new prescription for her cpap needs to be sent to [facility]

## 2019-11-26 ENCOUNTER — Telehealth: Payer: Self-pay

## 2019-11-26 NOTE — Addendum Note (Signed)
Addended by: Debbora Presto L on: 11/26/2019 03:33 PM   Modules accepted: Orders

## 2019-11-26 NOTE — Telephone Encounter (Signed)
Order for cpap supplies sent to [facility] via [facility]. Confirmation received that the order transmitted was successful.

## 2019-11-26 NOTE — Telephone Encounter (Signed)
Note from pt stating new orders signed, have not received yet, may put in new orders.  (cpap supplies including chin strap).  Placed new order please sign off.

## 2019-11-26 NOTE — Telephone Encounter (Signed)
error 

## 2019-11-26 NOTE — Addendum Note (Signed)
Addended by: Brandon Melnick on: 11/26/2019 02:20 PM   Modules accepted: Orders

## 2019-12-18 DIAGNOSIS — G4733 Obstructive sleep apnea (adult) (pediatric): Secondary | ICD-10-CM | POA: Diagnosis not present

## 2020-01-13 DIAGNOSIS — G4733 Obstructive sleep apnea (adult) (pediatric): Secondary | ICD-10-CM | POA: Diagnosis not present

## 2020-01-20 ENCOUNTER — Other Ambulatory Visit: Payer: Self-pay | Admitting: Family Medicine

## 2020-02-02 ENCOUNTER — Other Ambulatory Visit: Payer: Self-pay | Admitting: Physician Assistant

## 2020-02-02 ENCOUNTER — Other Ambulatory Visit: Payer: Self-pay

## 2020-02-02 ENCOUNTER — Ambulatory Visit: Payer: 59 | Admitting: Physician Assistant

## 2020-02-02 VITALS — BP 134/84 | HR 62 | Temp 98.7°F | Resp 18 | Ht 66.0 in | Wt 231.0 lb

## 2020-02-02 DIAGNOSIS — E119 Type 2 diabetes mellitus without complications: Secondary | ICD-10-CM

## 2020-02-02 DIAGNOSIS — E781 Pure hyperglyceridemia: Secondary | ICD-10-CM | POA: Diagnosis not present

## 2020-02-02 DIAGNOSIS — N921 Excessive and frequent menstruation with irregular cycle: Secondary | ICD-10-CM

## 2020-02-02 DIAGNOSIS — I10 Essential (primary) hypertension: Secondary | ICD-10-CM | POA: Diagnosis not present

## 2020-02-02 DIAGNOSIS — Z0000 Encounter for general adult medical examination without abnormal findings: Secondary | ICD-10-CM | POA: Diagnosis not present

## 2020-02-02 DIAGNOSIS — D500 Iron deficiency anemia secondary to blood loss (chronic): Secondary | ICD-10-CM

## 2020-02-02 DIAGNOSIS — Z6837 Body mass index (BMI) 37.0-37.9, adult: Secondary | ICD-10-CM | POA: Diagnosis not present

## 2020-02-02 DIAGNOSIS — Z1159 Encounter for screening for other viral diseases: Secondary | ICD-10-CM | POA: Diagnosis not present

## 2020-02-02 LAB — POCT GLYCOSYLATED HEMOGLOBIN (HGB A1C): Hemoglobin A1C: 6.6 % — AB (ref 4.0–5.6)

## 2020-02-02 MED ORDER — HYDROCHLOROTHIAZIDE 25 MG PO TABS: 25.0000 mg | ORAL_TABLET | Freq: Every day | ORAL | 3 refills | Status: DC

## 2020-02-02 MED ORDER — ATENOLOL 50 MG PO TABS: 50.0000 mg | ORAL_TABLET | Freq: Every day | ORAL | 3 refills | Status: DC

## 2020-02-02 NOTE — Patient Instructions (Signed)
I recommend that you work hard on lifestyle modifications over the next couple of months, limit starchy carbohydrates, such as half a cup of rice, half a cup of potatoes, half of a sweet potato to dinner, limit sugar intake, increase water intake, make sure that you are taking at least 2000 units of vitamin D every day.  Check your blood glucose levels every morning, keep a written log of foods that you're eating to help see correlation.  Please follow-up with fasting labs as soon as you are able.  Please let us know if there is anything else we can do for you  Kennieth Rad, PA-C Physician Assistant [place] [email or web address]    Mediterranean Diet A Mediterranean diet refers to food and lifestyle choices that are based on the traditions of countries located on the [place]. This way of eating has been shown to help prevent certain conditions and improve outcomes for people who have chronic diseases, like kidney disease and heart disease. What are tips for following this plan? Lifestyle  Cook and eat meals together with your family, when possible.  Drink enough fluid to keep your urine clear or pale yellow.  Be physically active every day. This includes: ? Aerobic exercise like running or swimming. ? Leisure activities like gardening, walking, or housework.  Get 7-8 hours of sleep each night.  If recommended by your health care provider, drink red wine in moderation. This means 1 glass a day for nonpregnant women and 2 glasses a day for men. A glass of wine equals 5 oz (150 mL). Reading food labels   Check the serving size of packaged foods. For foods such as rice and pasta, the serving size refers to the amount of cooked product, not dry.  Check the total fat in packaged foods. Avoid foods that have saturated fat or trans fats.  Check the ingredients list for added sugars, such as corn syrup. Shopping  At  the grocery store, buy most of your food from the areas near the walls of the store. This includes: ? Fresh fruits and vegetables (produce). ? Grains, beans, nuts, and seeds. Some of these may be available in unpackaged forms or large amounts (in bulk). ? Fresh seafood. ? Poultry and eggs. ? Low-fat dairy products.  Buy whole ingredients instead of prepackaged foods.  Buy fresh fruits and vegetables in-season from local farmers markets.  Buy frozen fruits and vegetables in resealable bags.  If you do not have access to quality fresh seafood, buy precooked frozen shrimp or canned fish, such as tuna, salmon, or sardines.  Buy small amounts of raw or cooked vegetables, salads, or olives from the deli or salad bar at your store.  Stock your pantry so you always have certain foods on hand, such as olive oil, canned tuna, canned tomatoes, rice, pasta, and beans. Cooking  Cook foods with extra-virgin olive oil instead of using butter or other vegetable oils.  Have meat as a side dish, and have vegetables or grains as your main dish. This means having meat in small portions or adding small amounts of meat to foods like pasta or stew.  Use beans or vegetables instead of meat in common dishes like chili or lasagna.  Experiment with different cooking methods. Try roasting or broiling vegetables instead of steaming or sauteing them.  Add frozen vegetables to soups, stews, pasta, or rice.  Add nuts or seeds for added healthy fat at each meal. You can add these to yogurt,  salads, or vegetable dishes.  Marinate fish or vegetables using olive oil, lemon juice, garlic, and fresh herbs. Meal planning   Plan to eat 1 vegetarian meal one day each week. Try to work up to 2 vegetarian meals, if possible.  Eat seafood 2 or more times a week.  Have healthy snacks readily available, such as: ? Vegetable sticks with hummus. ? [place] yogurt. ? Fruit and nut trail mix.  Eat balanced meals throughout  the week. This includes: ? Fruit: 2-3 servings a day ? Vegetables: 4-5 servings a day ? Low-fat dairy: 2 servings a day ? Fish, poultry, or lean meat: 1 serving a day ? Beans and legumes: 2 or more servings a week ? Nuts and seeds: 1-2 servings a day ? Whole grains: 6-8 servings a day ? Extra-virgin olive oil: 3-4 servings a day  Limit red meat and sweets to only a few servings a month What are my food choices?  Mediterranean diet ? Recommended  Grains: Whole-grain pasta. Brown rice. Bulgar wheat. Polenta. Couscous. Whole-wheat bread. [place].  Vegetables: Artichokes. Beets. Broccoli. Cabbage. Carrots. Eggplant. Green beans. Chard. Kale. Spinach. Onions. Leeks. Peas. Squash. Tomatoes. Peppers. Radishes.  Fruits: Apples. Apricots. Avocado. Berries. Bananas. Cherries. Dates. Figs. Grapes. Lemons. Melon. Oranges. Peaches. Plums. Pomegranate.  Meats and other protein foods: Beans. Almonds. Sunflower seeds. Pine nuts. Peanuts. [place]. Salmon. Scallops. Shrimp. [place]. Tilapia. Clams. Oysters. Eggs.  Dairy: Low-fat milk. Cheese. Greek yogurt.  Beverages: Water. Red wine. Herbal tea.  Fats and oils: Extra virgin olive oil. Avocado oil. Grape seed oil.  Sweets and desserts: [place] yogurt with honey. Baked apples. Poached pears. Trail mix.  Seasoning and other foods: Basil. Cilantro. Coriander. Cumin. Mint. Parsley. Sage. Rosemary. Tarragon. Garlic. Oregano. Thyme. Pepper. Balsalmic vinegar. Tahini. Hummus. Tomato sauce. Olives. Mushrooms. ? Limit these  Grains: Prepackaged pasta or rice dishes. Prepackaged cereal with added sugar.  Vegetables: Deep fried potatoes (french fries).  Fruits: Fruit canned in syrup.  Meats and other protein foods: Beef. Pork. Lamb. Poultry with skin. Hot dogs. Bernie[place] Salines.  Dairy: Ice cream. Sour cream. Whole milk.  Beverages: Juice. Sugar-sweetened soft drinks. Beer. Liquor and spirits.  Fats and oils: Butter. Canola oil. Vegetable oil. Beef fat  (tallow). Lard.  Sweets and desserts: Cookies. Cakes. Pies. Candy.  Seasoning and other foods: Mayonnaise. Premade sauces and marinades. The items listed may not be a complete list. Talk with your dietitian about what dietary choices are right for you. Summary  The Mediterranean diet includes both food and lifestyle choices.  Eat a variety of fresh fruits and vegetables, beans, nuts, seeds, and whole grains.  Limit the amount of red meat and sweets that you eat.  Talk with your health care provider about whether it is safe for you to drink red wine in moderation. This means 1 glass a day for nonpregnant women and 2 glasses a day for men. A glass of wine equals 5 oz (150 mL). This information is not intended to replace advice given to you by your health care provider. Make sure you discuss any questions you have with your health care provider. Document Revised: 02/24/2016 Document Reviewed: 02/17/2016 Elsevier Patient Education  [place].

## 2020-02-02 NOTE — Progress Notes (Signed)
New Patient Office Visit  Subjective:  Patient ID: Christina Schwartz, female    DOB: Sep 13, 1970  Age: 49 y.o. MRN: [id number]  CC:  Chief Complaint  Patient presents with  . Annual Exam    HPI Christina Schwartz reports that her primary care provider left the practice, she is waiting for an appointment in September to establish care with a new provider.  Reports that she is treated for hypertension, diabetes, and menorrhagia.  Reports that she has been sporadic with the Metformin, states that she tried it for a couple of weeks when she was first prescribed the medication but had GI upset and diarrhea states it did not improve, so she would only take it on occasion.  Reports that she has been checking her blood glucose at home on occasion, states it is generally in the 120s.  Reports that she has not been following a diabetic diet.  Reports that she is compliant to her blood pressure medications, states that her blood pressure is generally 130/80 when she checks it at home.  Reports that she is currently having a menses after several months of not having 1, reports that she takes iron during her menses.  Does take a daily over-the-counter vitamin D.  Vaccinated against Covid-19; [employer]         Past Medical History:  Diagnosis Date  . Anemia   . Heart murmur   . Hypertension     Past Surgical History:  Procedure Laterality Date  . TUBAL LIGATION    . WISDOM TOOTH EXTRACTION      Family History  Problem Relation Age of Onset  . Hypertension Father   . Pulmonary embolism Father   . Diabetes Maternal Grandmother   . Diabetes Sister   . Lymphoma Sister     Social History   Socioeconomic History  . Marital status: Legally Separated    Spouse name: bruce  . Number of children: 4  . Years of education: some college  . Highest education level: Not on file  Occupational History  . Occupation: Child psychotherapist: [employer]  Tobacco Use  . Smoking  status: Never Smoker  . Smokeless tobacco: Never Used  Vaping Use  . Vaping Use: Never used  Substance and Sexual Activity  . Alcohol use: No  . Drug use: No  . Sexual activity: Yes    Partners: Male    Birth control/protection: None, Surgical    Comment: BTL  Other Topics Concern  . Not on file  Social History Narrative   Lives with 4 children.      No gums in home.   Wears seatbelt.   Social Determinants of Health   Financial Resource Strain:   . Difficulty of Paying Living Expenses:   Food Insecurity:   . Worried About Charity fundraiser in the Last Year:   . Arboriculturist in the Last Year:   Transportation Needs:   . Film/video editor (Medical):   Marland Kitchen Lack of Transportation (Non-Medical):   Physical Activity:   . Days of Exercise per Week:   . Minutes of Exercise per Session:   Stress:   . Feeling of Stress :   Social Connections:   . Frequency of Communication with Friends and Family:   . Frequency of Social Gatherings with Friends and Family:   . Attends Religious Services:   . Active Member of Clubs or Organizations:   . Attends Archivist Meetings:   .  Marital Status:   Intimate Partner Violence:   . Fear of Current or Ex-Partner:   . Emotionally Abused:   Marland Kitchen Physically Abused:   . Sexually Abused:     ROS Review of Systems  Constitutional: Negative.   HENT: Negative.   Eyes: Negative.   Respiratory: Negative.   Cardiovascular: Negative.   Gastrointestinal: Negative for constipation and diarrhea.  Endocrine: Negative.   Genitourinary: Negative.   Musculoskeletal: Negative.   Skin: Negative.   Allergic/Immunologic: Negative.   Neurological: Negative.   Hematological: Negative.   Psychiatric/Behavioral: Negative for sleep disturbance.    Objective:   Today's Vitals: BP (!) 152/90 (BP Location: Left Arm, Patient Position: Sitting, Cuff Size: Large)   Pulse 62   Temp 98.7 F (37.1 C) (Oral)   Resp 18   Ht 5\' 6"  (1.676 m)    Wt (!) 231 lb (104.8 kg)   LMP 02/02/2020   SpO2 100%   BMI 37.28 kg/m   Physical Exam Constitutional:      Appearance: Normal appearance. She is obese.  HENT:     Head: Normocephalic and atraumatic.     Right Ear: Tympanic membrane, ear canal and external ear normal.     Left Ear: Tympanic membrane, ear canal and external ear normal.     Nose: Nose normal.     Mouth/Throat:     Mouth: Mucous membranes are moist.     Pharynx: Oropharynx is clear.  Eyes:     Extraocular Movements: Extraocular movements intact.     Conjunctiva/sclera: Conjunctivae normal.     Pupils: Pupils are equal, round, and reactive to light.  Cardiovascular:     Rate and Rhythm: Normal rate and regular rhythm.     Pulses: Normal pulses.     Heart sounds: Normal heart sounds.  Pulmonary:     Effort: Pulmonary effort is normal.     Breath sounds: Normal breath sounds.  Abdominal:     General: Abdomen is flat. Bowel sounds are normal.  Musculoskeletal:        General: Normal range of motion.     Cervical back: Normal range of motion and neck supple.  Skin:    General: Skin is warm and dry.  Neurological:     General: No focal deficit present.     Mental Status: She is alert and oriented to person, place, and time.  Psychiatric:        Mood and Affect: Mood normal.        Behavior: Behavior normal.        Thought Content: Thought content normal.        Judgment: Judgment normal.     Assessment & Plan:   Problem List Items Addressed This Visit      Cardiovascular and Mediastinum   Essential hypertension   Relevant Medications   atenolol (TENORMIN) 50 MG tablet   hydrochlorothiazide (HYDRODIURIL) 25 MG tablet     Endocrine   Type 2 diabetes mellitus without complication, without long-term current use of insulin (HCC) - Primary   Relevant Medications   hydrochlorothiazide (HYDRODIURIL) 25 MG tablet   Other Relevant Orders   HgB A1c (Completed)      Outpatient Encounter Medications as of  02/02/2020  Medication Sig  . ACCU-CHEK GUIDE test strip USE AS INSTRUCTED  . atenolol (TENORMIN) 50 MG tablet Take 1 tablet (50 mg total) by mouth daily.  . ferrous fumarate (HEMOCYTE - 106 MG FE) 325 (106 Fe) MG TABS tablet Take 1 tablet (  106 mg of iron total) by mouth daily.  . Iron-Vitamins (GERITOL PO) Take 1 tablet by mouth daily.  . metFORMIN (GLUCOPHAGE XR) 500 MG 24 hr tablet Take 1 tablet (500 mg total) by mouth daily with breakfast.  . [DISCONTINUED] atenolol (TENORMIN) 50 MG tablet Take 1 tablet (50 mg total) by mouth daily.  . [DISCONTINUED] hydrochlorothiazide (MICROZIDE) 12.5 MG capsule TAKE 1 CAPSULE (12.5 MG TOTAL) BY MOUTH 2 (TWO) TIMES DAILY.  Marland Kitchen Blood Glucose Monitoring Suppl (FREESTYLE LITE) DEVI   . FREESTYLE LITE test strip   . hydrochlorothiazide (HYDRODIURIL) 25 MG tablet Take 1 tablet (25 mg total) by mouth daily.  . Lancets (FREESTYLE) lancets    No facility-administered encounter medications on file as of 02/02/2020.  1. Encounter for wellness examination in adult Patient wants to d/c Metformin and  work on lifestyle modification instead of starting a different DM med at this time.  Patient education given on Mediterranean style eating, reducing carbs, increasing water and activity, continue over-the-counter multivitamin.   2. Type 2 diabetes mellitus without complication, without long-term current use of insulin (HCC)  - HgB A1c - hydrochlorothiazide (HYDRODIURIL) 25 MG tablet; Take 1 tablet (25 mg total) by mouth daily.  Dispense: 90 tablet; Refill: 3 - Comp. Metabolic Panel (12); Future - Microalbumin / creatinine urine ratio; Future - Vitamin D, 25-hydroxy; Future  3. Essential hypertension Continue current regimen - atenolol (TENORMIN) 50 MG tablet; Take 1 tablet (50 mg total) by mouth daily.  Dispense: 90 tablet; Refill: 3 - Comp. Metabolic Panel (12); Future  4. Menorrhagia with irregular cycle  - CBC with Differential/Platelet; Future - TSH;  Future - Iron, TIBC and Ferritin Panel; Future  5. Iron deficiency anemia due to chronic blood loss  - CBC with Differential/Platelet; Future - Iron, TIBC and Ferritin Panel; Future  6. Hypertriglyceridemia  - Lipid panel; Future  7. Body mass index (BMI) of 37.0-37.9 in adult  - Vitamin D, 25-hydroxy; Future  8. Encounter for HCV screening test for low risk patient  - HCV Ab w/Rflx to Verification; Future   Patient to complete fasting labs at a [facility] close to her work   I have reviewed the patient's medical history (PMH, PSH, Social History, Family History, Medications, and allergies) , and have been updated if relevant. I spent 30 minutes reviewing chart and  face to face time with patient.     Follow-up: No follow-ups on file.   Loraine Grip Mayers, PA-C

## 2020-02-03 MED FILL — ATENOLOL 50 MG TABLET: 50 | 90 days supply | Qty: 90 | Fill #0

## 2020-02-03 MED FILL — HYDROCHLOROTHIAZIDE 25 MG T: 25 | 90 days supply | Qty: 90 | Fill #0

## 2020-02-11 MED FILL — HYDROCHLOROTHIAZIDE 25 MG T: 25 | 90 days supply | Qty: 90 | Fill #0

## 2020-02-11 MED FILL — ATENOLOL 50 MG TABLET: 50 | 90 days supply | Qty: 90 | Fill #0

## 2020-03-16 ENCOUNTER — Ambulatory Visit: Payer: 59 | Admitting: Emergency Medicine

## 2020-03-16 ENCOUNTER — Other Ambulatory Visit: Payer: Self-pay

## 2020-03-16 ENCOUNTER — Encounter: Payer: Self-pay | Admitting: Emergency Medicine

## 2020-03-16 VITALS — BP 133/80 | HR 88 | Temp 98.0°F | Resp 16 | Ht 66.0 in | Wt 230.0 lb

## 2020-03-16 DIAGNOSIS — D500 Iron deficiency anemia secondary to blood loss (chronic): Secondary | ICD-10-CM | POA: Diagnosis not present

## 2020-03-16 DIAGNOSIS — E1159 Type 2 diabetes mellitus with other circulatory complications: Secondary | ICD-10-CM | POA: Diagnosis not present

## 2020-03-16 DIAGNOSIS — I152 Hypertension secondary to endocrine disorders: Secondary | ICD-10-CM

## 2020-03-16 DIAGNOSIS — Z6837 Body mass index (BMI) 37.0-37.9, adult: Secondary | ICD-10-CM | POA: Diagnosis not present

## 2020-03-16 DIAGNOSIS — Z7689 Persons encountering health services in other specified circumstances: Secondary | ICD-10-CM

## 2020-03-16 DIAGNOSIS — G4730 Sleep apnea, unspecified: Secondary | ICD-10-CM

## 2020-03-16 DIAGNOSIS — I10 Essential (primary) hypertension: Secondary | ICD-10-CM

## 2020-03-16 NOTE — Patient Instructions (Addendum)
   If you have lab work done today you will be contacted with your lab results within the next 2 weeks.  If you have not heard from us then please contact us. The fastest way to get your results is to register for My Chart.   IF you received an x-ray today, you will receive an invoice from [facility]. Please contact [facility] at [phone] with questions or concerns regarding your invoice.   IF you received labwork today, you will receive an invoice from [facility]. Please contact [facility] at [phone] with questions or concerns regarding your invoice.   Our billing staff will not be able to assist you with questions regarding bills from these companies.  You will be contacted with the lab results as soon as they are available. The fastest way to get your results is to activate your My Chart account. Instructions are located on the last page of this paperwork. If you have not heard from us regarding the results in 2 weeks, please contact this office.       Health Maintenance, Female Adopting a healthy lifestyle and getting preventive care are important in promoting health and wellness. Ask your health care provider about:  The right schedule for you to have regular tests and exams.  Things you can do on your own to prevent diseases and keep yourself healthy. What should I know about diet, weight, and exercise? Eat a healthy diet   Eat a diet that includes plenty of vegetables, fruits, low-fat dairy products, and lean protein.  Do not eat a lot of foods that are high in solid fats, added sugars, or sodium. Maintain a healthy weight Body mass index (BMI) is used to identify weight problems. It estimates body fat based on height and weight. Your health care provider can help determine your BMI and help you achieve or maintain a healthy weight. Get regular exercise Get regular exercise. This is one of the most important things you can do for your health. Most  adults should:  Exercise for at least 150 minutes each week. The exercise should increase your heart rate and make you sweat (moderate-intensity exercise).  Do strengthening exercises at least twice a week. This is in addition to the moderate-intensity exercise.  Spend less time sitting. Even light physical activity can be beneficial. Watch cholesterol and blood lipids Have your blood tested for lipids and cholesterol at 49 years of age, then have this test every 5 years. Have your cholesterol levels checked more often if:  Your lipid or cholesterol levels are high.  You are older than 49 years of age.  You are at high risk for heart disease. What should I know about cancer screening? Depending on your health history and family history, you may need to have cancer screening at various ages. This may include screening for:  Breast cancer.  Cervical cancer.  Colorectal cancer.  Skin cancer.  Lung cancer. What should I know about heart disease, diabetes, and high blood pressure? Blood pressure and heart disease  High blood pressure causes heart disease and increases the risk of stroke. This is more likely to develop in people who have high blood pressure readings, are of African descent, or are overweight.  Have your blood pressure checked: ? Every 3-5 years if you are 18-39 years of age. ? Every year if you are 40 years old or older. Diabetes Have regular diabetes screenings. This checks your fasting blood sugar level. Have the screening done:  Once   every three years after age 40 if you are at a normal weight and have a low risk for diabetes.  More often and at a younger age if you are overweight or have a high risk for diabetes. What should I know about preventing infection? Hepatitis B If you have a higher risk for hepatitis B, you should be screened for this virus. Talk with your health care provider to find out if you are at risk for hepatitis B infection. Hepatitis  C Testing is recommended for:  Everyone born from 1945 through 1965.  Anyone with known risk factors for hepatitis C. Sexually transmitted infections (STIs)  Get screened for STIs, including gonorrhea and chlamydia, if: ? You are sexually active and are younger than 49 years of age. ? You are older than 49 years of age and your health care provider tells you that you are at risk for this type of infection. ? Your sexual activity has changed since you were last screened, and you are at increased risk for chlamydia or gonorrhea. Ask your health care provider if you are at risk.  Ask your health care provider about whether you are at high risk for HIV. Your health care provider may recommend a prescription medicine to help prevent HIV infection. If you choose to take medicine to prevent HIV, you should first get tested for HIV. You should then be tested every 3 months for as long as you are taking the medicine. Pregnancy  If you are about to stop having your period (premenopausal) and you may become pregnant, seek counseling before you get pregnant.  Take 400 to 800 micrograms (mcg) of folic acid every day if you become pregnant.  Ask for birth control (contraception) if you want to prevent pregnancy. Osteoporosis and menopause Osteoporosis is a disease in which the bones lose minerals and strength with aging. This can result in bone fractures. If you are 65 years old or older, or if you are at risk for osteoporosis and fractures, ask your health care provider if you should:  Be screened for bone loss.  Take a calcium or vitamin D supplement to lower your risk of fractures.  Be given hormone replacement therapy (HRT) to treat symptoms of menopause. Follow these instructions at home: Lifestyle  Do not use any products that contain nicotine or tobacco, such as cigarettes, e-cigarettes, and chewing tobacco. If you need help quitting, ask your health care provider.  Do not use street  drugs.  Do not share needles.  Ask your health care provider for help if you need support or information about quitting drugs. Alcohol use  Do not drink alcohol if: ? Your health care provider tells you not to drink. ? You are pregnant, may be pregnant, or are planning to become pregnant.  If you drink alcohol: ? Limit how much you use to 0-1 drink a day. ? Limit intake if you are breastfeeding.  Be aware of how much alcohol is in your drink. In the [place] one drink equals one 12 oz bottle of beer (355 mL), one 5 oz glass of wine (148 mL), or one 1 oz glass of hard liquor (44 mL). General instructions  Schedule regular health, dental, and eye exams.  Stay current with your vaccines.  Tell your health care provider if: ? You often feel depressed. ? You have ever been abused or do not feel safe at home. Summary  Adopting a healthy lifestyle and getting preventive care are important in promoting health and   wellness.  Follow your health care provider's instructions about healthy diet, exercising, and getting tested or screened for diseases.  Follow your health care provider's instructions on monitoring your cholesterol and blood pressure. This information is not intended to replace advice given to you by your health care provider. Make sure you discuss any questions you have with your health care provider. Document Revised: 06/19/2018 Document Reviewed: 06/19/2018 Elsevier Patient Education  2020 [employer] Inc[employer]  

## 2020-03-16 NOTE — Progress Notes (Signed)
Christina Schwartz 49 y.o.   Chief Complaint  Patient presents with  . Transitions Of Care    former Dr Christina Schwartz    HISTORY OF PRESENT ILLNESS: This is a 49 y.o. female here to establish care with me.  Used to see Dr. Nolon Schwartz. Has history of hypertension on atenolol and hydrochlorothiazide. Has history of diabetes, intolerant to Metformin, presently diet controlled. No history of dyslipidemia.  Not presently on statin therapy. Has no complaints or medical concerns today. Fully vaccinated against Covid. [employer] employee. Marland Kitchen   HPI   Prior to Admission medications   Medication Sig Start Date End Date Taking? Authorizing Provider  atenolol (TENORMIN) 50 MG tablet Take 1 tablet (50 mg total) by mouth daily. 02/02/20  Yes Schwartz, Christina S, PA-C  ferrous fumarate (HEMOCYTE - 106 MG FE) 325 (106 Fe) MG TABS tablet Take 1 tablet (106 mg of iron total) by mouth daily. 11/27/17  Yes Schwartz, Christina L, PA-C  hydrochlorothiazide (HYDRODIURIL) 25 MG tablet Take 1 tablet (25 mg total) by mouth daily. 02/02/20  Yes Schwartz, Christina S, PA-C  Iron-Vitamins (GERITOL PO) Take 1 tablet by mouth daily.   Yes [provider]  ACCU-CHEK GUIDE test strip USE AS INSTRUCTED 09/12/19   Christina Moron, MD  Blood Glucose Monitoring Suppl (FREESTYLE LITE) DEVI  09/19/19   [provider]  FREESTYLE LITE test strip  09/19/19   [provider]  Lancets (FREESTYLE) lancets  09/19/19   [provider]  metFORMIN (GLUCOPHAGE XR) 500 MG 24 hr tablet Take 1 tablet (500 mg total) by mouth daily with breakfast. Patient not taking: Reported on 03/16/2020 01/01/19   Christina Moron, MD    No Known Allergies  Patient Active Problem List   Diagnosis Date Noted  . Sleep apnea 02/05/2019  . Class 1 obesity due to excess calories with serious comorbidity and body mass index (BMI) of 34.0 to 34.9 in adult 02/05/2019  . Dyslipidemia associated with type 2 diabetes mellitus ([place]) 06/09/2018  .  Hypertriglyceridemia 06/09/2018  . Hypertension associated with diabetes ([place]) 08/18/2016  . Iron deficiency anemia due to chronic blood loss 08/18/2016  . Fibroids 05/08/2012  . Menorrhagia 04/15/2012    Past Medical History:  Diagnosis Date  . Anemia   . Heart murmur   . Hypertension     Past Surgical History:  Procedure Laterality Date  . TUBAL LIGATION    . WISDOM TOOTH EXTRACTION      Social History   Socioeconomic History  . Marital status: Legally Separated    Spouse name: Christina Schwartz  . Number of children: 4  . Years of education: some college  . Highest education level: Not on file  Occupational History  . Occupation: Child psychotherapist: [employer]  Tobacco Use  . Smoking status: Never Smoker  . Smokeless tobacco: Never Used  Vaping Use  . Vaping Use: Never used  Substance and Sexual Activity  . Alcohol use: No  . Drug use: No  . Sexual activity: Yes    Partners: Male    Birth control/protection: None, Surgical    Comment: BTL  Other Topics Concern  . Not on file  Social History Narrative   Lives with 4 children.      No gums in home.   Wears seatbelt.   Social Determinants of Health   Financial Resource Strain:   . Difficulty of Paying Living Expenses: Not on file  Food Insecurity:   . Worried  About Running Out of Food in the Last Year: Not on file  . Ran Out of Food in the Last Year: Not on file  Transportation Needs:   . Lack of Transportation (Medical): Not on file  . Lack of Transportation (Non-Medical): Not on file  Physical Activity:   . Days of Exercise per Week: Not on file  . Minutes of Exercise per Session: Not on file  Stress:   . Feeling of Stress : Not on file  Social Connections:   . Frequency of Communication with Friends and Family: Not on file  . Frequency of Social Gatherings with Friends and Family: Not on file  . Attends Religious Services: Not on file  . Active Member of Clubs or Organizations: Not on file   . Attends Archivist Meetings: Not on file  . Marital Status: Not on file  Intimate Partner Violence:   . Fear of Current or Ex-Partner: Not on file  . Emotionally Abused: Not on file  . Physically Abused: Not on file  . Sexually Abused: Not on file    Family History  Problem Relation Age of Onset  . Hypertension Father   . Pulmonary embolism Father   . Diabetes Maternal Grandmother   . Diabetes Sister   . Lymphoma Sister      Review of Systems  Constitutional: Negative.  Negative for chills and fever.  HENT: Negative.  Negative for congestion and sore throat.   Respiratory: Negative.  Negative for cough and shortness of breath.   Cardiovascular: Negative.  Negative for chest pain and palpitations.  Gastrointestinal: Negative.  Negative for abdominal pain, blood in stool, constipation, diarrhea, melena, nausea and vomiting.  Genitourinary: Negative.  Negative for dysuria and hematuria.  Skin: Negative.  Negative for rash.  Neurological: Negative.  Negative for dizziness and headaches.  All other systems reviewed and are negative.    Vitals:   03/16/20 1325  BP: 133/80  Pulse: 88  Resp: 16  Temp: 98 F (36.7 C)  SpO2: 98%     Physical Exam Vitals reviewed.  Constitutional:      Appearance: Normal appearance.  HENT:     Head: Normocephalic.  Eyes:     Extraocular Movements: Extraocular movements intact.     Conjunctiva/sclera: Conjunctivae normal.     Pupils: Pupils are equal, round, and reactive to light.  Cardiovascular:     Rate and Rhythm: Normal rate and regular rhythm.     Pulses: Normal pulses.     Heart sounds: Normal heart sounds.  Pulmonary:     Effort: Pulmonary effort is normal.     Breath sounds: Normal breath sounds.  Musculoskeletal:        General: Normal range of motion.     Cervical back: Normal range of motion and neck supple.  Skin:    General: Skin is warm and dry.     Capillary Refill: Capillary refill takes less than 2  seconds.  Neurological:     General: No focal deficit present.     Mental Status: She is alert and oriented to person, place, and time.  Psychiatric:        Mood and Affect: Mood normal.        Behavior: Behavior normal.      ASSESSMENT & PLAN: [name] was seen today for transitions of care.  Diagnoses and all orders for this visit:  Hypertension associated with diabetes ([place]) -     CBC with Differential/Platelet -  Comprehensive metabolic panel -     Hemoglobin A1c -     Lipid panel  Encounter to establish care  Essential hypertension  Sleep apnea, unspecified type  Iron deficiency anemia due to chronic blood loss  Body mass index (BMI) of 37.0-37.9 in adult    Patient Instructions       If you have lab work done today you will be contacted with your lab results within the next 2 weeks.  If you have not heard from [place] then please contact us. The fastest way to get your results is to register for My Chart.   IF you received an x-ray today, you will receive an invoice from [facility]. Please contact [facility] at [phone] with questions or concerns regarding your invoice.   IF you received labwork today, you will receive an invoice from [facility]. Please contact [facility] at [phone] with questions or concerns regarding your invoice.   Our billing staff will not be able to assist you with questions regarding bills from these companies.  You will be contacted with the lab results as soon as they are available. The fastest way to get your results is to activate your My Chart account. Instructions are located on the last page of this paperwork. If you have not heard from [place] regarding the results in 2 weeks, please contact this office.     Health Maintenance, Female Adopting a healthy lifestyle and getting preventive care are important in promoting health and wellness. Ask your health care provider about:  The right schedule for you to  have regular tests and exams.  Things you can do on your own to prevent diseases and keep yourself healthy. What should I know about diet, weight, and exercise? Eat a healthy diet   Eat a diet that includes plenty of vegetables, fruits, low-fat dairy products, and lean protein.  Do not eat a lot of foods that are high in solid fats, added sugars, or sodium. Maintain a healthy weight Body mass index (BMI) is used to identify weight problems. It estimates body fat based on height and weight. Your health care provider can help determine your BMI and help you achieve or maintain a healthy weight. Get regular exercise Get regular exercise. This is one of the most important things you can do for your health. Most adults should:  Exercise for at least 150 minutes each week. The exercise should increase your heart rate and make you sweat (moderate-intensity exercise).  Do strengthening exercises at least twice a week. This is in addition to the moderate-intensity exercise.  Spend less time sitting. Even light physical activity can be beneficial. Watch cholesterol and blood lipids Have your blood tested for lipids and cholesterol at 49 years of age, then have this test every 5 years. Have your cholesterol levels checked more often if:  Your lipid or cholesterol levels are high.  You are older than 49 years of age.  You are at high risk for heart disease. What should I know about cancer screening? Depending on your health history and family history, you may need to have cancer screening at various ages. This may include screening for:  Breast cancer.  Cervical cancer.  Colorectal cancer.  Skin cancer.  Lung cancer. What should I know about heart disease, diabetes, and high blood pressure? Blood pressure and heart disease  High blood pressure causes heart disease and increases the risk of stroke. This is more likely to develop in people who have high blood pressure  readings, are of  African descent, or are overweight.  Have your blood pressure checked: ? Every 3-5 years if you are 98-62 years of age. ? Every year if you are 64 years old or older. Diabetes Have regular diabetes screenings. This checks your fasting blood sugar level. Have the screening done:  Once every three years after age 73 if you are at a normal weight and have a low risk for diabetes.  More often and at a younger age if you are overweight or have a high risk for diabetes. What should I know about preventing infection? Hepatitis B If you have a higher risk for hepatitis B, you should be screened for this virus. Talk with your health care provider to find out if you are at risk for hepatitis B infection. Hepatitis C Testing is recommended for:  Everyone born from [birth year] through 1965.  Anyone with known risk factors for hepatitis C. Sexually transmitted infections (STIs)  Get screened for STIs, including gonorrhea and chlamydia, if: ? You are sexually active and are younger than 49 years of age. ? You are older than 49 years of age and your health care provider tells you that you are at risk for this type of infection. ? Your sexual activity has changed since you were last screened, and you are at increased risk for chlamydia or gonorrhea. Ask your health care provider if you are at risk.  Ask your health care provider about whether you are at high risk for HIV. Your health care provider may recommend a prescription medicine to help prevent HIV infection. If you choose to take medicine to prevent HIV, you should first get tested for HIV. You should then be tested every 3 months for as long as you are taking the medicine. Pregnancy  If you are about to stop having your period (premenopausal) and you may become pregnant, seek counseling before you get pregnant.  Take 400 to 800 micrograms (mcg) of folic acid every day if you become pregnant.  Ask for birth control (contraception) if you want to  prevent pregnancy. Osteoporosis and menopause Osteoporosis is a disease in which the bones lose minerals and strength with aging. This can result in bone fractures. If you are 73 years old or older, or if you are at risk for osteoporosis and fractures, ask your health care provider if you should:  Be screened for bone loss.  Take a calcium or vitamin D supplement to lower your risk of fractures.  Be given hormone replacement therapy (HRT) to treat symptoms of menopause. Follow these instructions at home: Lifestyle  Do not use any products that contain nicotine or tobacco, such as cigarettes, e-cigarettes, and chewing tobacco. If you need help quitting, ask your health care provider.  Do not use street drugs.  Do not share needles.  Ask your health care provider for help if you need support or information about quitting drugs. Alcohol use  Do not drink alcohol if: ? Your health care provider tells you not to drink. ? You are pregnant, may be pregnant, or are planning to become pregnant.  If you drink alcohol: ? Limit how much you use to 0-1 drink a day. ? Limit intake if you are breastfeeding.  Be aware of how much alcohol is in your drink. In the [place].Schwartz., one drink equals one 12 oz bottle of beer (355 mL), one 5 oz glass of wine (148 mL), or one 1 oz glass of hard liquor (44 mL). General instructions  Schedule regular health, dental, and eye exams.  Stay current with your vaccines.  Tell your health care provider if: ? You often feel depressed. ? You have ever been abused or do not feel safe at home. Summary  Adopting a healthy lifestyle and getting preventive care are important in promoting health and wellness.  Follow your health care provider'Schwartz instructions about healthy diet, exercising, and getting tested or screened for diseases.  Follow your health care provider'Schwartz instructions on monitoring your cholesterol and blood pressure. This information is not intended to  replace advice given to you by your health care provider. Make sure you discuss any questions you have with your health care provider. Document Revised: 06/19/2018 Document Reviewed: 06/19/2018 Elsevier Patient Education  2020 Elsevier Inc.      Agustina Caroli, MD [facility]

## 2020-03-24 ENCOUNTER — Encounter: Payer: Self-pay | Admitting: Emergency Medicine

## 2020-04-02 ENCOUNTER — Other Ambulatory Visit: Payer: Self-pay | Admitting: Emergency Medicine

## 2020-04-02 DIAGNOSIS — Z1231 Encounter for screening mammogram for malignant neoplasm of breast: Secondary | ICD-10-CM

## 2020-04-13 DIAGNOSIS — G4733 Obstructive sleep apnea (adult) (pediatric): Secondary | ICD-10-CM | POA: Diagnosis not present

## 2020-04-23 ENCOUNTER — Ambulatory Visit
Admission: RE | Admit: 2020-04-23 | Discharge: 2020-04-23 | Disposition: A | Discharge status: Home / Self Care | Payer: 59 | Source: Ambulatory Visit | Attending: Emergency Medicine | Admitting: Emergency Medicine

## 2020-04-23 ENCOUNTER — Other Ambulatory Visit: Payer: Self-pay

## 2020-04-23 DIAGNOSIS — Z1231 Encounter for screening mammogram for malignant neoplasm of breast: Secondary | ICD-10-CM

## 2020-05-21 MED FILL — ATENOLOL 50 MG TABLET: 50 | 90 days supply | Qty: 90 | Fill #1

## 2020-05-21 MED FILL — HYDROCHLOROTHIAZIDE 25 MG T: 25 | 90 days supply | Qty: 90 | Fill #1

## 2020-07-14 DIAGNOSIS — G4733 Obstructive sleep apnea (adult) (pediatric): Secondary | ICD-10-CM | POA: Diagnosis not present

## 2020-07-28 NOTE — Progress Notes (Deleted)
PATIENT: Christina Schwartz DOB: 11-09-1970  REASON FOR VISIT: follow up HISTORY FROM: patient  No chief complaint on file.    HISTORY OF PRESENT ILLNESS: Today 07/28/20  She returns today for follow up.   Compliance report dated   07/30/2019 ALL:  Christina Schwartz is a 50 y.o. female here today for follow up for OSA on CPAP.  Sleep study revealed severe OSA with AHI of 47.5/h and REM AHI of 78/h. O2 nadir of 78%.  She returns today for her initial CPAP review.  She reports that she is doing very well on CPAP therapy.  She denies any difficulty with her machine.  She is currently using a fullface mask but is interested in trying nasal pillows.  No difficulty with current mask but that she might prefer a nasal pillow.  She has noted significant improvements in sleep quality and daytime energy levels.  Compliance report dated 06/29/2019 through 07/28/2019 reveals that she used CPAP 30 out of the last 30 days for compliance of 100%.  28 of the last 30 days she used CPAP greater than 4 hours for compliance of 93%.  Average usage was 6 hours and 49 minutes.  Residual AHI was 3.5 on a set pressure of 8 cm of water and an EPR of 3.  There was no significant leak noted.  HISTORY: (copied from Dr Christina Schwartz note on 02/13/2019)  Dear Dr. Holly Schwartz,   I saw your patient, Christina Schwartz, upon your kind request in my sleep clinic today for initial consultation of her sleep disturbance, in particular, concern for underlying obstructive sleep apnea.  The patient is unaccompanied today.  As you know, [name] is a 50 year old right-handed woman with an underlying medical history of hypertension, diabetes, hypertriglyceridemia, anemia and obesity, who reports snoring and excessive daytime somnolence.  She has a family history of sleep apnea.  I reviewed your office note from 02/05/2019. Her Epworth sleepiness score is 13 out of 24, fatigue severity score is 48 out of 63.  She is typically in bed between 930 and 10  and rise time is around 6.  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  She lives alone and for grown children.  She has no pets in the household.  Recently when she stayed at her mother's house, her sister-in-law noticed apneas while patient was asleep.  They shared a room at the time.  Her brother has sleep apnea and uses a CPAP machine as well as her sister and she also reports that her father had sleep apnea, he passed away at age 63.  She has rare morning headaches, no night to night nocturia.  She denies any telltale symptoms of restless leg syndrome.  She would be willing to get tested for sleep apnea and consider CPAP therapy.  She is trying to lose weight through weight watchers and has lost 5 pounds thus far.  She is a non-smoker and does not drink alcohol and drinks caffeine and limitation, occasional soda, no daily coffee or tea.   REVIEW OF SYSTEMS: Out of a complete 14 system review of symptoms, the patient complains only of the following symptoms, none and all other reviewed systems are negative.  Epworth sleepiness scale: 1 Fatigue severity scale: 9  ALLERGIES: No Known Allergies  HOME MEDICATIONS: Outpatient Medications Prior to Visit  Medication Sig Dispense Refill  . ACCU-CHEK GUIDE test strip USE AS INSTRUCTED 100 strip 0  . atenolol (TENORMIN) 50 MG  tablet Take 1 tablet (50 mg total) by mouth daily. 90 tablet 3  . Blood Glucose Monitoring Suppl (FREESTYLE LITE) DEVI     . ferrous fumarate (HEMOCYTE - 106 MG FE) 325 (106 Fe) MG TABS tablet Take 1 tablet (106 mg of iron total) by mouth daily. 90 each 4  . FREESTYLE LITE test strip     . hydrochlorothiazide (HYDRODIURIL) 25 MG tablet Take 1 tablet (25 mg total) by mouth daily. 90 tablet 3  . Iron-Vitamins (GERITOL PO) Take 1 tablet by mouth daily.    . Lancets (FREESTYLE) lancets     . metFORMIN (GLUCOPHAGE XR) 500 MG 24 hr tablet Take 1 tablet (500 mg total) by mouth daily with  breakfast. (Patient not taking: Reported on 03/16/2020) 90 tablet 1   No facility-administered medications prior to visit.    PAST MEDICAL HISTORY: Past Medical History:  Diagnosis Date  . Anemia   . Heart murmur   . Hypertension     PAST SURGICAL HISTORY: Past Surgical History:  Procedure Laterality Date  . TUBAL LIGATION    . WISDOM TOOTH EXTRACTION      FAMILY HISTORY: Family History  Problem Relation Age of Onset  . Hypertension Father   . Pulmonary embolism Father   . Diabetes Maternal Grandmother   . Diabetes Sister   . Lymphoma Sister     SOCIAL HISTORY: Social History   Socioeconomic History  . Marital status: Legally Separated    Spouse name: bruce  . Number of children: 4  . Years of education: some college  . Highest education level: Not on file  Occupational History  . Occupation: Child psychotherapist: [employer]  Tobacco Use  . Smoking status: Never Smoker  . Smokeless tobacco: Never Used  Vaping Use  . Vaping Use: Never used  Substance and Sexual Activity  . Alcohol use: No  . Drug use: No  . Sexual activity: Yes    Partners: Male    Birth control/protection: None, Surgical    Comment: BTL  Other Topics Concern  . Not on file  Social History Narrative   Lives with 4 children.      No gums in home.   Wears seatbelt.   Social Determinants of Health   Financial Resource Strain: Not on file  Food Insecurity: Not on file  Transportation Needs: Not on file  Physical Activity: Not on file  Stress: Not on file  Social Connections: Not on file  Intimate Partner Violence: Not on file      PHYSICAL EXAM  There were no vitals filed for this visit. There is no height or weight on file to calculate BMI.  Generalized: Well developed, in no acute distress  Cardiology: normal rate and rhythm, no murmur noted Respiratory: Clear to auscultation bilaterally Neurological examination  Mentation: Alert oriented to time, place,  history taking. Follows all commands speech and language fluent Cranial nerve II-XII: Pupils were equal round reactive to light. Extraocular movements were full, visual field were full  Motor: The motor testing reveals 5 over 5 strength of all 4 extremities. Good symmetric motor tone is noted throughout.  Gait and station: Gait is normal.   DIAGNOSTIC DATA (LABS, IMAGING, TESTING) - I reviewed patient records, labs, notes, testing and imaging myself where available.  No flowsheet data found.   Lab Results  Component Value Date   WBC 4.4 (A) 07/14/2019   HGB 12 07/14/2019   HCT 38.4 07/14/2019   MCV  75.3 (A) 07/14/2019   PLT 288 01/01/2019      Component Value Date/Time   NA 139 07/14/2019 1621   K 3.6 07/14/2019 1621   CL 98 07/14/2019 1621   CO2 26 07/14/2019 1621   GLUCOSE 159 (H) 07/14/2019 1621   GLUCOSE 119 (H) 07/22/2015 2002   BUN 10 07/14/2019 1621   CREATININE 0.82 07/14/2019 1621   CALCIUM 9.0 07/14/2019 1621   PROT 7.2 07/14/2019 1621   ALBUMIN 4.1 07/14/2019 1621   AST 35 07/14/2019 1621   ALT 31 07/14/2019 1621   ALKPHOS 61 07/14/2019 1621   BILITOT 0.5 07/14/2019 1621   GFRNONAA 85 07/14/2019 1621   GFRAA 98 07/14/2019 1621   Lab Results  Component Value Date   CHOL 152 01/01/2019   HDL 45 01/01/2019   LDLCALC 81 01/01/2019   TRIG 128 01/01/2019   CHOLHDL 3.4 01/01/2019   Lab Results  Component Value Date   HGBA1C 6.6 (A) 02/02/2020   Lab Results  Component Value Date   ZDGLOVFI43 329 05/31/2018   Lab Results  Component Value Date   TSH 0.849 11/27/2017       ASSESSMENT AND PLAN 50 y.o. year old female  has a past medical history of Anemia, Heart murmur, and Hypertension. here with     ICD-10-CM   1. OSA on CPAP  G47.33    Z99.89     Iviana was doing very well on CPAP therapy.  She reports significant improvement in sleep quality and daytime energy.  Compliance report reveals excellent compliance.  She was encouraged to continue using  CPAP nightly and for greater than 4 hours each night.  We will send a new order today for supplies and consideration for a nasal pillow mask.  She will monitor her CPAP machine and ensure adequate mask seal.  She was instructed to call me with any difficulty.  She will follow-up with me in 1 year, sooner if needed.  She verbalizes understanding and agreement with this plan.     No orders of the defined types were placed in this encounter.    No orders of the defined types were placed in this encounter.     I spent 15 minutes with the patient. 50% of this time was spent counseling and educating patient on plan of care and medications.    Debbora Presto, FNP-C 07/28/2020, 10:42 AM [facility] [address] [place],  [address] [phone]

## 2020-07-28 NOTE — Patient Instructions (Incomplete)
Please continue using your CPAP regularly. While your insurance requires that you use CPAP at least 4 hours each night on 70% of the nights, I recommend, that you not skip any nights and use it throughout the night if you can. Getting used to CPAP and staying with the treatment long term does take time and patience and discipline. Untreated obstructive sleep apnea when it is moderate to severe can have an adverse impact on cardiovascular health and raise her risk for heart disease, arrhythmias, hypertension, congestive heart failure, stroke and diabetes. Untreated obstructive sleep apnea causes sleep disruption, nonrestorative sleep, and sleep deprivation. This can have an impact on your day to day functioning and cause daytime sleepiness and impairment of cognitive function, memory loss, mood disturbance, and problems focussing. Using CPAP regularly can improve these symptoms.   Follow up in 1 year   Sleep Apnea Sleep apnea affects breathing during sleep. It causes breathing to stop for a short time or to become shallow. It can also increase the risk of:  Heart attack.  Stroke.  Being very overweight (obese).  Diabetes.  Heart failure.  Irregular heartbeat. The goal of treatment is to help you breathe normally again. What are the causes? There are three kinds of sleep apnea:  Obstructive sleep apnea. This is caused by a blocked or collapsed airway.  Central sleep apnea. This happens when the brain does not send the right signals to the muscles that control breathing.  Mixed sleep apnea. This is a combination of obstructive and central sleep apnea. The most common cause of this condition is a collapsed or blocked airway. This can happen if:  Your throat muscles are too relaxed.  Your tongue and tonsils are too large.  You are overweight.  Your airway is too small.   What increases the risk?  Being overweight.  Smoking.  Having a small airway.  Being older.  Being  female.  Drinking alcohol.  Taking medicines to calm yourself (sedatives or tranquilizers).  Having family members with the condition. What are the signs or symptoms?  Trouble staying asleep.  Being sleepy or tired during the day.  Getting angry a lot.  Loud snoring.  Headaches in the morning.  Not being able to focus your mind (concentrate).  Forgetting things.  Less interest in sex.  Mood swings.  Personality changes.  Feelings of sadness (depression).  Waking up a lot during the night to pee (urinate).  Dry mouth.  Sore throat. How is this diagnosed?  Your medical history.  A physical exam.  A test that is done when you are sleeping (sleep study). The test is most often done in a sleep lab but may also be done at home. How is this treated?  Sleeping on your side.  Using a medicine to get rid of mucus in your nose (decongestant).  Avoiding the use of alcohol, medicines to help you relax, or certain pain medicines (narcotics).  Losing weight, if needed.  Changing your diet.  Not smoking.  Using a machine to open your airway while you sleep, such as: ? An oral appliance. This is a mouthpiece that shifts your lower jaw forward. ? A CPAP device. This device blows air through a mask when you breathe out (exhale). ? An EPAP device. This has valves that you put in each nostril. ? A BPAP device. This device blows air through a mask when you breathe in (inhale) and breathe out.  Having surgery if other treatments do not work. It   is important to get treatment for sleep apnea. Without treatment, it can lead to:  High blood pressure.  Coronary artery disease.  In men, not being able to have an erection (impotence).  Reduced thinking ability.   Follow these instructions at home: Lifestyle  Make changes that your doctor recommends.  Eat a healthy diet.  Lose weight if needed.  Avoid alcohol, medicines to help you relax, and some pain  medicines.  Do not use any products that contain nicotine or tobacco, such as cigarettes, e-cigarettes, and chewing tobacco. If you need help quitting, ask your doctor. General instructions  Take over-the-counter and prescription medicines only as told by your doctor.  If you were given a machine to use while you sleep, use it only as told by your doctor.  If you are having surgery, make sure to tell your doctor you have sleep apnea. You may need to bring your device with you.  Keep all follow-up visits as told by your doctor. This is important. Contact a doctor if:  The machine that you were given to use during sleep bothers you or does not seem to be working.  You do not get better.  You get worse. Get help right away if:  Your chest hurts.  You have trouble breathing in enough air.  You have an uncomfortable feeling in your back, arms, or stomach.  You have trouble talking.  One side of your body feels weak.  A part of your face is hanging down. These symptoms may be an emergency. Do not wait to see if the symptoms will go away. Get medical help right away. Call your local emergency services (911 in the [place] Do not drive yourself to the hospital. Summary  This condition affects breathing during sleep.  The most common cause is a collapsed or blocked airway.  The goal of treatment is to help you breathe normally while you sleep. This information is not intended to replace advice given to you by your health care provider. Make sure you discuss any questions you have with your health care provider. Document Revised: 04/12/2018 Document Reviewed: 02/19/2018 Elsevier Patient Education  2021 Elsevier Inc.  

## 2020-07-29 ENCOUNTER — Ambulatory Visit: Payer: 59 | Admitting: Family Medicine

## 2020-08-05 ENCOUNTER — Other Ambulatory Visit: Payer: Self-pay

## 2020-08-05 ENCOUNTER — Ambulatory Visit (INDEPENDENT_AMBULATORY_CARE_PROVIDER_SITE_OTHER): Payer: 59

## 2020-08-05 DIAGNOSIS — Z23 Encounter for immunization: Secondary | ICD-10-CM

## 2020-08-05 NOTE — Progress Notes (Signed)
   Covid-19 Vaccination [facility]  Name:  Christina Schwartz    MRN: [id number] DOB: 04-10-1971  08/05/2020  Christina Schwartz was observed post Covid-19 immunization for 15 minutes without incident. She was provided with Vaccine Information Sheet and instruction to access the V-Safe system.   Christina Schwartz was instructed to call 911 with any severe reactions post vaccine: Marland Kitchen Difficulty breathing  . Swelling of face and throat  . A fast heartbeat  . A bad rash all over body  . Dizziness and weakness

## 2020-08-30 MED FILL — ATENOLOL 50 MG TABLET: 50 | 90 days supply | Qty: 90 | Fill #2

## 2020-08-30 MED FILL — HYDROCHLOROTHIAZIDE 25 MG T: 25 | 90 days supply | Qty: 90 | Fill #2

## 2020-09-09 ENCOUNTER — Ambulatory Visit: Payer: 59 | Admitting: Emergency Medicine

## 2020-09-09 ENCOUNTER — Encounter: Payer: Self-pay | Admitting: Emergency Medicine

## 2020-09-09 ENCOUNTER — Other Ambulatory Visit: Payer: Self-pay | Admitting: Emergency Medicine

## 2020-09-09 ENCOUNTER — Other Ambulatory Visit: Payer: Self-pay

## 2020-09-09 VITALS — BP 131/82 | HR 76 | Temp 98.0°F | Resp 16 | Ht 66.0 in | Wt 238.0 lb

## 2020-09-09 DIAGNOSIS — E785 Hyperlipidemia, unspecified: Secondary | ICD-10-CM | POA: Diagnosis not present

## 2020-09-09 DIAGNOSIS — I152 Hypertension secondary to endocrine disorders: Secondary | ICD-10-CM

## 2020-09-09 DIAGNOSIS — E1159 Type 2 diabetes mellitus with other circulatory complications: Secondary | ICD-10-CM | POA: Diagnosis not present

## 2020-09-09 DIAGNOSIS — E1169 Type 2 diabetes mellitus with other specified complication: Secondary | ICD-10-CM

## 2020-09-09 DIAGNOSIS — Z131 Encounter for screening for diabetes mellitus: Secondary | ICD-10-CM

## 2020-09-09 DIAGNOSIS — Z1211 Encounter for screening for malignant neoplasm of colon: Secondary | ICD-10-CM | POA: Diagnosis not present

## 2020-09-09 LAB — POCT GLYCOSYLATED HEMOGLOBIN (HGB A1C): Hemoglobin A1C: 7 % — AB (ref 4.0–5.6)

## 2020-09-09 LAB — GLUCOSE, POCT (MANUAL RESULT ENTRY): POC Glucose: 104 mg/dl — AB (ref 70–99)

## 2020-09-09 MED ORDER — METFORMIN HCL ER 500 MG PO TB24: 500.0000 mg | ORAL_TABLET | Freq: Two times a day (BID) | ORAL | 3 refills | Status: DC

## 2020-09-09 MED ORDER — LISINOPRIL-HYDROCHLOROTHIAZIDE 20-12.5 MG PO TABS
1.0000 | ORAL_TABLET | Freq: Every day | ORAL | 3 refills | Status: DC
Start: 2020-09-09 — End: 2020-09-09

## 2020-09-09 MED ORDER — ROSUVASTATIN CALCIUM 10 MG PO TABS: 10.0000 mg | ORAL_TABLET | Freq: Every day | ORAL | 3 refills | Status: DC

## 2020-09-09 MED FILL — LISINOPRIL-HCTZ 20-12.5 MG: 20-12.5 | 90 days supply | Qty: 90 | Fill #0

## 2020-09-09 MED FILL — METFORMIN HCL ER 500 MG TB2: 500 | 90 days supply | Qty: 180 | Fill #0

## 2020-09-09 MED FILL — ROSUVASTATIN CALCIUM 10 MG: 10 | 90 days supply | Qty: 90 | Fill #0

## 2020-09-09 NOTE — Progress Notes (Addendum)
Christina Schwartz 50 y.o.   Chief Complaint  Patient presents with  . Hypertension    Follow up and chronic medical problems    HISTORY OF PRESENT ILLNESS: This is a 50 y.o. female with history of diabetes, hypertension, and dyslipidemia here for follow-up. Presently on Tenormin and hydrochlorothiazide. Has been off Metformin for a while. Not on a statin. No allergies. Has no complaints or medical concerns today. The 10-year ASCVD risk score Christina Schwartz DC Brooke Bonito., et al., 2013) is: 8.6%   Values used to calculate the score:     Age: 11 years     Sex: Female     Is Non-Hispanic African American: Yes     Diabetic: Yes     Tobacco smoker: No     Systolic Blood Pressure: 583 mmHg     Is BP treated: Yes     HDL Cholesterol: 45 mg/dL     Total Cholesterol: 152 mg/dL Lab Results  Component Value Date   HGBA1C 6.6 (A) 02/02/2020   Lab Results  Component Value Date   CHOL 152 01/01/2019   HDL 45 01/01/2019   LDLCALC 81 01/01/2019   TRIG 128 01/01/2019   CHOLHDL 3.4 01/01/2019    HPI   Prior to Admission medications   Medication Sig Start Date End Date Taking? Authorizing Provider  atenolol (TENORMIN) 50 MG tablet Take 1 tablet (50 mg total) by mouth daily. 02/02/20  Yes Schwartz, Christina S, PA-C  ferrous fumarate (HEMOCYTE - 106 MG FE) 325 (106 Fe) MG TABS tablet Take 1 tablet (106 mg of iron total) by mouth daily. 11/27/17  Yes Schwartz, Christina L, PA-C  hydrochlorothiazide (HYDRODIURIL) 25 MG tablet Take 1 tablet (25 mg total) by mouth daily. 02/02/20  Yes Schwartz, Christina S, PA-C  Iron-Vitamins (GERITOL PO) Take 1 tablet by mouth daily.   Yes [provider]  ACCU-CHEK GUIDE test strip USE AS INSTRUCTED 09/12/19   Christina Moron, MD  Blood Glucose Monitoring Suppl (FREESTYLE LITE) DEVI  09/19/19   [provider]  FREESTYLE LITE test strip  09/19/19   [provider]  Lancets (FREESTYLE) lancets  09/19/19   [provider]  metFORMIN (GLUCOPHAGE XR) 500 MG 24 hr  tablet Take 1 tablet (500 mg total) by mouth daily with breakfast. Patient not taking: Reported on 09/09/2020 01/01/19   Christina Moron, MD    No Known Allergies  Patient Active Problem List   Diagnosis Date Noted  . Sleep apnea 02/05/2019  . Class 1 obesity due to excess calories with serious comorbidity and body mass index (BMI) of 34.0 to 34.9 in adult 02/05/2019  . Dyslipidemia associated with type 2 diabetes mellitus ([place]) 06/09/2018  . Hypertriglyceridemia 06/09/2018  . Hypertension associated with diabetes ([place]) 08/18/2016  . Iron deficiency anemia due to chronic blood loss 08/18/2016  . Fibroids 05/08/2012  . Menorrhagia 04/15/2012    Past Medical History:  Diagnosis Date  . Anemia   . Heart murmur   . Hypertension     Past Surgical History:  Procedure Laterality Date  . TUBAL LIGATION    . WISDOM TOOTH EXTRACTION      Social History   Socioeconomic History  . Marital status: Legally Separated    Spouse name: Christina Schwartz  . Number of children: 4  . Years of education: some college  . Highest education level: Not on file  Occupational History  . Occupation: Child psychotherapist:   Tobacco Use  . Smoking  status: Never Smoker  . Smokeless tobacco: Never Used  Vaping Use  . Vaping Use: Never used  Substance and Sexual Activity  . Alcohol use: No  . Drug use: No  . Sexual activity: Yes    Partners: Male    Birth control/protection: None, Surgical    Comment: BTL  Other Topics Concern  . Not on file  Social History Narrative   Lives with 4 children.      No gums in home.   Wears seatbelt.   Social Determinants of Health   Financial Resource Strain: Not on file  Food Insecurity: Not on file  Transportation Needs: Not on file  Physical Activity: Not on file  Stress: Not on file  Social Connections: Not on file  Intimate Partner Violence: Not on file    Family History  Problem Relation Age of Onset  . Hypertension Father   .  Pulmonary embolism Father   . Diabetes Maternal Grandmother   . Diabetes Sister   . Lymphoma Sister      Review of Systems  Constitutional: Negative.  Negative for chills and fever.  HENT: Negative.  Negative for congestion and sore throat.   Respiratory: Negative.  Negative for cough and shortness of breath.   Cardiovascular: Negative.  Negative for chest pain and palpitations.  Gastrointestinal: Negative.  Negative for abdominal pain, blood in stool, diarrhea, nausea and vomiting.  Genitourinary: Negative.  Negative for dysuria and hematuria.  Musculoskeletal: Negative.  Negative for myalgias.  Skin: Negative.  Negative for rash.  Neurological: Negative.  Negative for dizziness and headaches.  All other systems reviewed and are negative.  Today's Vitals   09/09/20 1339  BP: 131/82  Pulse: 76  Resp: 16  Temp: 98 F (36.7 C)  TempSrc: Temporal  SpO2: 98%  Weight: 238 lb (108 kg)  Height: _0  (1.676 m)   Body mass index is 38.41 kg/m.   Physical Exam Vitals reviewed.  Constitutional:      Appearance: She is obese.  HENT:     Head: Normocephalic.  Eyes:     Extraocular Movements: Extraocular movements intact.     Pupils: Pupils are equal, round, and reactive to light.  Cardiovascular:     Rate and Rhythm: Normal rate and regular rhythm.     Pulses: Normal pulses.     Heart sounds: Murmur (2/6 systolic best heard at aortic valve, patient aware of it) heard.    Pulmonary:     Effort: Pulmonary effort is normal.     Breath sounds: Normal breath sounds.  Musculoskeletal:        General: Normal range of motion.     Cervical back: Normal range of motion and neck supple.  Skin:    General: Skin is warm and dry.     Capillary Refill: Capillary refill takes less than 2 seconds.  Neurological:     General: No focal deficit present.     Mental Status: She is alert and oriented to person, place, and time.  Psychiatric:        Mood and Affect: Mood normal.         Behavior: Behavior normal.     Results for orders placed or performed in visit on 09/09/20 (from the past 24 hour(s))  POCT glucose (manual entry)     Status: Abnormal   Collection Time: 09/09/20  2:11 PM  Result Value Ref Range   POC Glucose 104 (A) 70 - 99 mg/dl  POCT glycosylated hemoglobin (Hb  A1C)     Status: Abnormal   Collection Time: 09/09/20  2:11 PM  Result Value Ref Range   Hemoglobin A1C 7.0 (A) 4.0 - 5.6 %   HbA1c POC (<> result, manual entry)     HbA1c, POC (prediabetic range)     HbA1c, POC (controlled diabetic range)     A total of 40 minutes was spent with the patient, greater than 50% of which was in counseling/coordination of care regarding diabetes and hypertension and cardiovascular risks associated with these conditions, review of all medications and changes made, review of most recent blood work results including today's hemoglobin A1c, education on nutrition, review of most recent office visit notes, health maintenance items, prognosis, documentation and need for follow-up in 3 months.  ASSESSMENT & PLAN: Hypertension associated with diabetes ([place]) Systolic blood pressures elevated. Continue Tenormin 50 mg daily. Stop hydrochlorothiazide and start Zestoretic 20-12.5 mg daily. Last hemoglobin A1c was 7.0 off Metformin. Restart Metformin 500 mg twice a day. Diet and nutrition discussed.  Dyslipidemia associated with type 2 diabetes mellitus ([facility]) Start rosuvastatin 10 mg daily. Diet and nutrition discussed.  Christina Schwartz was seen today for hypertension.  Diagnoses and all orders for this visit:  Hypertension associated with diabetes ([place]) -     Lipid panel -     CMP14+EGFR -     POCT glucose (manual entry) -     POCT glycosylated hemoglobin (Hb A1C) -     Microalbumin, urine -     HM Diabetes Foot Exam -     lisinopril-hydrochlorothiazide (ZESTORETIC) 20-12.5 MG tablet; Take 1 tablet by mouth daily. -     metFORMIN (GLUCOPHAGE XR) 500 MG 24 hr tablet; Take 1  tablet (500 mg total) by mouth 2 (two) times daily with a meal.  Dyslipidemia associated with type 2 diabetes mellitus ([facility]) -     rosuvastatin (CRESTOR) 10 MG tablet; Take 1 tablet (10 mg total) by mouth daily.  Screening for colon cancer -     Ambulatory referral to Gastroenterology  Screening for diabetes mellitus -     Ambulatory referral to Ophthalmology    Patient Instructions   Continue atenolol 50 mg daily. Stop hydrochlorothiazide. Start lisinopril-HCTZ 20-12.5 mg daily. Start Metformin 500 mg twice a day. Start rosuvastatin 10 mg daily.    If you have lab work done today you will be contacted with your lab results within the next 2 weeks.  If you have not heard from [place] then please contact us. The fastest way to get your results is to register for My Chart.   IF you received an x-ray today, you will receive an invoice from [facility]. Please contact [facility] at [phone] with questions or concerns regarding your invoice.   IF you received labwork today, you will receive an invoice from [facility]. Please contact [facility] at [phone] with questions or concerns regarding your invoice.   Our billing staff will not be able to assist you with questions regarding bills from these companies.  You will be contacted with the lab results as soon as they are available. The fastest way to get your results is to activate your My Chart account. Instructions are located on the last page of this paperwork. If you have not heard from [place] regarding the results in 2 weeks, please contact this office.     Hypertension, Adult High blood pressure (hypertension) is when the force of blood pumping through the arteries is too strong. The arteries are the blood vessels  that carry blood from the heart throughout the body. Hypertension forces the heart to work harder to pump blood and may cause arteries to become narrow or stiff. Untreated or uncontrolled hypertension can  cause a heart attack, heart failure, a stroke, kidney disease, and other problems. A blood pressure reading consists of a higher number over a lower number. Ideally, your blood pressure should be below 120/80. The first ("top") number is called the systolic pressure. It is a measure of the pressure in your arteries as your heart beats. The second ("bottom") number is called the diastolic pressure. It is a measure of the pressure in your arteries as the heart relaxes. What are the causes? The exact cause of this condition is not known. There are some conditions that result in or are related to high blood pressure. What increases the risk? Some risk factors for high blood pressure are under your control. The following factors may make you more likely to develop this condition:  Smoking.  Having type 2 diabetes mellitus, high cholesterol, or both.  Not getting enough exercise or physical activity.  Being overweight.  Having too much fat, sugar, calories, or salt (sodium) in your diet.  Drinking too much alcohol. Some risk factors for high blood pressure may be difficult or impossible to change. Some of these factors include:  Having chronic kidney disease.  Having a family history of high blood pressure.  Age. Risk increases with age.  Race. You may be at higher risk if you are African American.  Gender. Men are at higher risk than women before age 38. After age 3, women are at higher risk than men.  Having obstructive sleep apnea.  Stress. What are the signs or symptoms? High blood pressure may not cause symptoms. Very high blood pressure (hypertensive crisis) may cause:  Headache.  Anxiety.  Shortness of breath.  Nosebleed.  Nausea and vomiting.  Vision changes.  Severe chest pain.  Seizures. How is this diagnosed? This condition is diagnosed by measuring your blood pressure while you are seated, with your arm resting on a flat surface, your legs uncrossed, and  your feet flat on the floor. The cuff of the blood pressure monitor will be placed directly against the skin of your upper arm at the level of your heart. It should be measured at least twice using the same arm. Certain conditions can cause a difference in blood pressure between your right and left arms. Certain factors can cause blood pressure readings to be lower or higher than normal for a short period of time:  When your blood pressure is higher when you are in a health care provider's office than when you are at home, this is called white coat hypertension. Most people with this condition do not need medicines.  When your blood pressure is higher at home than when you are in a health care provider's office, this is called masked hypertension. Most people with this condition may need medicines to control blood pressure. If you have a high blood pressure reading during one visit or you have normal blood pressure with other risk factors, you may be asked to:  Return on a different day to have your blood pressure checked again.  Monitor your blood pressure at home for 1 week or longer. If you are diagnosed with hypertension, you may have other blood or imaging tests to help your health care provider understand your overall risk for other conditions. How is this treated? This condition is treated by  making healthy lifestyle changes, such as eating healthy foods, exercising more, and reducing your alcohol intake. Your health care provider may prescribe medicine if lifestyle changes are not enough to get your blood pressure under control, and if:  Your systolic blood pressure is above 130.  Your diastolic blood pressure is above 80. Your personal target blood pressure may vary depending on your medical conditions, your age, and other factors. Follow these instructions at home: Eating and drinking  Eat a diet that is high in fiber and potassium, and low in sodium, added sugar, and fat. An example  eating plan is called the DASH (Dietary Approaches to Stop Hypertension) diet. To eat this way: ? Eat plenty of fresh fruits and vegetables. Try to fill one half of your plate at each meal with fruits and vegetables. ? Eat whole grains, such as whole-wheat pasta, brown rice, or whole-grain bread. Fill about one fourth of your plate with whole grains. ? Eat or drink low-fat dairy products, such as skim milk or low-fat yogurt. ? Avoid fatty cuts of meat, processed or cured meats, and poultry with skin. Fill about one fourth of your plate with lean proteins, such as fish, chicken without skin, beans, eggs, or tofu. ? Avoid pre-made and processed foods. These tend to be higher in sodium, added sugar, and fat.  Reduce your daily sodium intake. Most people with hypertension should eat less than 1,500 mg of sodium a day.  Do not drink alcohol if: ? Your health care provider tells you not to drink. ? You are pregnant, may be pregnant, or are planning to become pregnant.  If you drink alcohol: ? Limit how much you use to:  0-1 drink a day for women.  0-2 drinks a day for men. ? Be aware of how much alcohol is in your drink. In the [place] one drink equals one 12 oz bottle of beer (355 mL), one 5 oz glass of wine (148 mL), or one 1 oz glass of hard liquor (44 mL).   Lifestyle  Work with your health care provider to maintain a healthy body weight or to lose weight. Ask what an ideal weight is for you.  Get at least 30 minutes of exercise most days of the week. Activities may include walking, swimming, or biking.  Include exercise to strengthen your muscles (resistance exercise), such as Pilates or lifting weights, as part of your weekly exercise routine. Try to do these types of exercises for 30 minutes at least 3 days a week.  Do not use any products that contain nicotine or tobacco, such as cigarettes, e-cigarettes, and chewing tobacco. If you need help quitting, ask your health care  provider.  Monitor your blood pressure at home as told by your health care provider.  Keep all follow-up visits as told by your health care provider. This is important.   Medicines  Take over-the-counter and prescription medicines only as told by your health care provider. Follow directions carefully. Blood pressure medicines must be taken as prescribed.  Do not skip doses of blood pressure medicine. Doing this puts you at risk for problems and can make the medicine less effective.  Ask your health care provider about side effects or reactions to medicines that you should watch for. Contact a health care provider if you:  Think you are having a reaction to a medicine you are taking.  Have headaches that keep coming back (recurring).  Feel dizzy.  Have swelling in your ankles.  Have trouble  with your vision. Get help right away if you:  Develop a severe headache or confusion.  Have unusual weakness or numbness.  Feel faint.  Have severe pain in your chest or abdomen.  Vomit repeatedly.  Have trouble breathing. Summary  Hypertension is when the force of blood pumping through your arteries is too strong. If this condition is not controlled, it may put you at risk for serious complications.  Your personal target blood pressure may vary depending on your medical conditions, your age, and other factors. For most people, a normal blood pressure is less than 120/80.  Hypertension is treated with lifestyle changes, medicines, or a combination of both. Lifestyle changes include losing weight, eating a healthy, low-sodium diet, exercising more, and limiting alcohol. This information is not intended to replace advice given to you by your health care provider. Make sure you discuss any questions you have with your health care provider. Document Revised: 03/06/2018 Document Reviewed: 03/06/2018 Elsevier Patient Education  2021 Elsevier Inc.      Agustina Caroli, MD [facility]

## 2020-09-09 NOTE — Patient Instructions (Addendum)
Continue atenolol 50 mg daily. Stop hydrochlorothiazide. Start lisinopril-HCTZ 20-12.5 mg daily. Start Metformin 500 mg twice a day. Start rosuvastatin 10 mg daily.    If you have lab work done today you will be contacted with your lab results within the next 2 weeks.  If you have not heard from [place] then please contact us. The fastest way to get your results is to register for My Chart.   IF you received an x-ray today, you will receive an invoice from [facility]. Please contact [facility] at [phone] with questions or concerns regarding your invoice.   IF you received labwork today, you will receive an invoice from [facility]. Please contact [facility] at [phone] with questions or concerns regarding your invoice.   Our billing staff will not be able to assist you with questions regarding bills from these companies.  You will be contacted with the lab results as soon as they are available. The fastest way to get your results is to activate your My Chart account. Instructions are located on the last page of this paperwork. If you have not heard from [place] regarding the results in 2 weeks, please contact this office.     Hypertension, Adult High blood pressure (hypertension) is when the force of blood pumping through the arteries is too strong. The arteries are the blood vessels that carry blood from the heart throughout the body. Hypertension forces the heart to work harder to pump blood and may cause arteries to become narrow or stiff. Untreated or uncontrolled hypertension can cause a heart attack, heart failure, a stroke, kidney disease, and other problems. A blood pressure reading consists of a higher number over a lower number. Ideally, your blood pressure should be below 120/80. The first ("top") number is called the systolic pressure. It is a measure of the pressure in your arteries as your heart beats. The second ("bottom") number is called the diastolic pressure.  It is a measure of the pressure in your arteries as the heart relaxes. What are the causes? The exact cause of this condition is not known. There are some conditions that result in or are related to high blood pressure. What increases the risk? Some risk factors for high blood pressure are under your control. The following factors may make you more likely to develop this condition:  Smoking.  Having type 2 diabetes mellitus, high cholesterol, or both.  Not getting enough exercise or physical activity.  Being overweight.  Having too much fat, sugar, calories, or salt (sodium) in your diet.  Drinking too much alcohol. Some risk factors for high blood pressure may be difficult or impossible to change. Some of these factors include:  Having chronic kidney disease.  Having a family history of high blood pressure.  Age. Risk increases with age.  Race. You may be at higher risk if you are African American.  Gender. Men are at higher risk than women before age 64. After age 11, women are at higher risk than men.  Having obstructive sleep apnea.  Stress. What are the signs or symptoms? High blood pressure may not cause symptoms. Very high blood pressure (hypertensive crisis) may cause:  Headache.  Anxiety.  Shortness of breath.  Nosebleed.  Nausea and vomiting.  Vision changes.  Severe chest pain.  Seizures. How is this diagnosed? This condition is diagnosed by measuring your blood pressure while you are seated, with your arm resting on a flat surface, your legs uncrossed, and your feet flat on the floor.  The cuff of the blood pressure monitor will be placed directly against the skin of your upper arm at the level of your heart. It should be measured at least twice using the same arm. Certain conditions can cause a difference in blood pressure between your right and left arms. Certain factors can cause blood pressure readings to be lower or higher than normal for a short  period of time:  When your blood pressure is higher when you are in a health care provider's office than when you are at home, this is called white coat hypertension. Most people with this condition do not need medicines.  When your blood pressure is higher at home than when you are in a health care provider's office, this is called masked hypertension. Most people with this condition may need medicines to control blood pressure. If you have a high blood pressure reading during one visit or you have normal blood pressure with other risk factors, you may be asked to:  Return on a different day to have your blood pressure checked again.  Monitor your blood pressure at home for 1 week or longer. If you are diagnosed with hypertension, you may have other blood or imaging tests to help your health care provider understand your overall risk for other conditions. How is this treated? This condition is treated by making healthy lifestyle changes, such as eating healthy foods, exercising more, and reducing your alcohol intake. Your health care provider may prescribe medicine if lifestyle changes are not enough to get your blood pressure under control, and if:  Your systolic blood pressure is above 130.  Your diastolic blood pressure is above 80. Your personal target blood pressure may vary depending on your medical conditions, your age, and other factors. Follow these instructions at home: Eating and drinking  Eat a diet that is high in fiber and potassium, and low in sodium, added sugar, and fat. An example eating plan is called the DASH (Dietary Approaches to Stop Hypertension) diet. To eat this way: ? Eat plenty of fresh fruits and vegetables. Try to fill one half of your plate at each meal with fruits and vegetables. ? Eat whole grains, such as whole-wheat pasta, brown rice, or whole-grain bread. Fill about one fourth of your plate with whole grains. ? Eat or drink low-fat dairy products, such as  skim milk or low-fat yogurt. ? Avoid fatty cuts of meat, processed or cured meats, and poultry with skin. Fill about one fourth of your plate with lean proteins, such as fish, chicken without skin, beans, eggs, or tofu. ? Avoid pre-made and processed foods. These tend to be higher in sodium, added sugar, and fat.  Reduce your daily sodium intake. Most people with hypertension should eat less than 1,500 mg of sodium a day.  Do not drink alcohol if: ? Your health care provider tells you not to drink. ? You are pregnant, may be pregnant, or are planning to become pregnant.  If you drink alcohol: ? Limit how much you use to:  0-1 drink a day for women.  0-2 drinks a day for men. ? Be aware of how much alcohol is in your drink. In the [place] one drink equals one 12 oz bottle of beer (355 mL), one 5 oz glass of wine (148 mL), or one 1 oz glass of hard liquor (44 mL).   Lifestyle  Work with your health care provider to maintain a healthy body weight or to lose weight. Ask what an  ideal weight is for you.  Get at least 30 minutes of exercise most days of the week. Activities may include walking, swimming, or biking.  Include exercise to strengthen your muscles (resistance exercise), such as Pilates or lifting weights, as part of your weekly exercise routine. Try to do these types of exercises for 30 minutes at least 3 days a week.  Do not use any products that contain nicotine or tobacco, such as cigarettes, e-cigarettes, and chewing tobacco. If you need help quitting, ask your health care provider.  Monitor your blood pressure at home as told by your health care provider.  Keep all follow-up visits as told by your health care provider. This is important.   Medicines  Take over-the-counter and prescription medicines only as told by your health care provider. Follow directions carefully. Blood pressure medicines must be taken as prescribed.  Do not skip doses of blood pressure medicine.  Doing this puts you at risk for problems and can make the medicine less effective.  Ask your health care provider about side effects or reactions to medicines that you should watch for. Contact a health care provider if you:  Think you are having a reaction to a medicine you are taking.  Have headaches that keep coming back (recurring).  Feel dizzy.  Have swelling in your ankles.  Have trouble with your vision. Get help right away if you:  Develop a severe headache or confusion.  Have unusual weakness or numbness.  Feel faint.  Have severe pain in your chest or abdomen.  Vomit repeatedly.  Have trouble breathing. Summary  Hypertension is when the force of blood pumping through your arteries is too strong. If this condition is not controlled, it may put you at risk for serious complications.  Your personal target blood pressure may vary depending on your medical conditions, your age, and other factors. For most people, a normal blood pressure is less than 120/80.  Hypertension is treated with lifestyle changes, medicines, or a combination of both. Lifestyle changes include losing weight, eating a healthy, low-sodium diet, exercising more, and limiting alcohol. This information is not intended to replace advice given to you by your health care provider. Make sure you discuss any questions you have with your health care provider. Document Revised: 03/06/2018 Document Reviewed: 03/06/2018 Elsevier Patient Education  2021 [employer].

## 2020-09-09 NOTE — Assessment & Plan Note (Signed)
Systolic blood pressures elevated. Continue Tenormin 50 mg daily. Stop hydrochlorothiazide and start Zestoretic 20-12.5 mg daily. Last hemoglobin A1c was 7.0 off Metformin. Restart Metformin 500 mg twice a day. Diet and nutrition discussed.

## 2020-09-09 NOTE — Assessment & Plan Note (Signed)
Start rosuvastatin 10 mg daily. Diet and nutrition discussed.

## 2020-09-10 LAB — CMP14+EGFR
ALT: 31 IU/L (ref 0–32)
AST: 25 IU/L (ref 0–40)
Albumin/Globulin Ratio: 1.3 (ref 1.2–2.2)
Albumin: 4 g/dL (ref 3.8–4.8)
Alkaline Phosphatase: 69 IU/L (ref 44–121)
BUN/Creatinine Ratio: 19 (ref 9–23)
BUN: 16 mg/dL (ref 6–24)
Bilirubin Total: 0.5 mg/dL (ref 0.0–1.2)
CO2: 25 mmol/L (ref 20–29)
Calcium: 9.5 mg/dL (ref 8.7–10.2)
Chloride: 99 mmol/L (ref 96–106)
Creatinine, Ser: 0.84 mg/dL (ref 0.57–1.00)
Globulin, Total: 3 g/dL (ref 1.5–4.5)
Glucose: 89 mg/dL (ref 65–99)
Potassium: 3.6 mmol/L (ref 3.5–5.2)
Sodium: 139 mmol/L (ref 134–144)
Total Protein: 7 g/dL (ref 6.0–8.5)
eGFR: 85 mL/min/{1.73_m2} (ref >59)

## 2020-09-10 LAB — LIPID PANEL
Chol/HDL Ratio: 4.1 ratio (ref 0.0–4.4)
Cholesterol, Total: 159 mg/dL (ref 100–199)
HDL: 39 mg/dL — ABNORMAL LOW (ref >39)
LDL Chol Calc (NIH): 89 mg/dL (ref 0–99)
Triglycerides: 178 mg/dL — ABNORMAL HIGH (ref 0–149)
VLDL Cholesterol Cal: 31 mg/dL (ref 5–40)

## 2020-09-10 LAB — MICROALBUMIN, URINE: Microalbumin, Urine: 9.5 ug/mL

## 2020-10-13 ENCOUNTER — Encounter: Payer: Self-pay | Admitting: Physician Assistant

## 2020-10-13 ENCOUNTER — Ambulatory Visit (INDEPENDENT_AMBULATORY_CARE_PROVIDER_SITE_OTHER): Payer: 59

## 2020-10-13 ENCOUNTER — Ambulatory Visit (INDEPENDENT_AMBULATORY_CARE_PROVIDER_SITE_OTHER): Payer: 59 | Admitting: Physician Assistant

## 2020-10-13 DIAGNOSIS — M7541 Impingement syndrome of right shoulder: Secondary | ICD-10-CM

## 2020-10-13 NOTE — Progress Notes (Signed)
Office Visit Note   Patient: Christina Schwartz           Date of Birth: 03/24/1971           MRN: [id number] Visit Date: 10/13/2020              Requested by: Horald Pollen, [place],  [place] [address] PCP: Horald Pollen, MD   Assessment & Plan: Visit Diagnoses:  1. Shoulder impingement syndrome, right     Plan: Treatment options were reviewed with the patient at this point time she elected most conservative measures.  Therefore we given her prescription for physical therapy for right shoulder impingement to work on range of motion strengthening include modalities and home exercise program.  She shown some shoulder exercises she can perform on her own until she can get therapy.  Also have her take Aleve 2 tablets twice daily with food.  We will see her back in 4 weeks to see what type of response she had to the therapy.  Follow-Up Instructions: Return in about 4 weeks (around 11/10/2020).   Orders:  Orders Placed This Encounter  Procedures  . XR Shoulder Right   No orders of the defined types were placed in this encounter.     Procedures: No procedures performed   Clinical Data: No additional findings.   Subjective: Chief Complaint  Patient presents with  . Right Shoulder - Pain    HPI Christina Schwartz is a pleasant 50 year old female were seen for right shoulder pain.  Was seen in the past for her left knee.  Right shoulder pains been ongoing for few weeks no known injury.  She has pain with overhead activity has decreased range of motion shoulder.  She denies any numbness tingling down the arm.  Denies any arm pain.  Most pain anterior proximal aspect shoulder.  She has been using icy hot and blue MU on the shoulder which helps some.  She has had no prior shoulder pain.  Pain does not awaken her.  She is diabetic reports hemoglobin A1c recently was 7.0.  Review of Systems Negative outside HPI or noncontributory.  Objective: Vital  Signs: There were no vitals taken for this visit.  Physical Exam Constitutional:      Appearance: She is not ill-appearing or diaphoretic.  Pulmonary:     Effort: Pulmonary effort is normal.  Neurological:     Mental Status: She is oriented to person, place, and time.  Psychiatric:        Mood and Affect: Mood normal.     Ortho Exam She has 5-5 strength with external and internal rotation against resistance bilateral shoulders.  Speed test and empty can test is negative bilaterally.  Liftoff test negative bilaterally.  She has decreased overhead activity with the right shoulder and has to ratchet the arm up only able to tolerate about 160 degrees full range of motion of the left shoulder without pain.  Positive impingement on the right negative on the left. Specialty Comments:  No specialty comments available.  Imaging: XR Shoulder Right  Result Date: 10/13/2020 Right shoulder 3 views: Shoulders well located.  No acute fractures.  Axillary view shows the glenohumeral joint to be well maintained.  Downsloping acromion.  No other bony abnormalities.    PMFS History: Patient Active Problem List   Diagnosis Date Noted  . Sleep apnea 02/05/2019  . Class 1 obesity due to excess calories with serious comorbidity and body mass index (BMI) of  34.0 to 34.9 in adult 02/05/2019  . Dyslipidemia associated with type 2 diabetes mellitus ([place]) 06/09/2018  . Hypertriglyceridemia 06/09/2018  . Hypertension associated with diabetes ([place]) 08/18/2016  . Iron deficiency anemia due to chronic blood loss 08/18/2016  . Fibroids 05/08/2012  . Menorrhagia 04/15/2012   Past Medical History:  Diagnosis Date  . Anemia   . Heart murmur   . Hypertension     Family History  Problem Relation Age of Onset  . Hypertension Father   . Pulmonary embolism Father   . Diabetes Maternal Grandmother   . Diabetes Sister   . Lymphoma Sister     Past Surgical History:  Procedure Laterality Date  . TUBAL  LIGATION    . WISDOM TOOTH EXTRACTION     Social History   Occupational History  . Occupation: Child psychotherapist: [employer]  Tobacco Use  . Smoking status: Never Smoker  . Smokeless tobacco: Never Used  Vaping Use  . Vaping Use: Never used  Substance and Sexual Activity  . Alcohol use: No  . Drug use: No  . Sexual activity: Yes    Partners: Male    Birth control/protection: None, Surgical    Comment: BTL

## 2020-10-14 ENCOUNTER — Other Ambulatory Visit: Payer: Self-pay

## 2020-10-14 DIAGNOSIS — M7541 Impingement syndrome of right shoulder: Secondary | ICD-10-CM

## 2020-11-05 DIAGNOSIS — G4733 Obstructive sleep apnea (adult) (pediatric): Secondary | ICD-10-CM | POA: Diagnosis not present

## 2020-11-09 ENCOUNTER — Other Ambulatory Visit: Payer: Self-pay

## 2020-11-09 ENCOUNTER — Ambulatory Visit: Payer: 59 | Attending: Physician Assistant

## 2020-11-09 DIAGNOSIS — M25511 Pain in right shoulder: Secondary | ICD-10-CM

## 2020-11-09 DIAGNOSIS — R293 Abnormal posture: Secondary | ICD-10-CM

## 2020-11-09 DIAGNOSIS — M25611 Stiffness of right shoulder, not elsewhere classified: Secondary | ICD-10-CM | POA: Diagnosis not present

## 2020-11-09 DIAGNOSIS — M6281 Muscle weakness (generalized): Secondary | ICD-10-CM | POA: Diagnosis not present

## 2020-11-09 NOTE — Therapy (Signed)
[place], [place], [address] Phone: [phone]   Fax:  [phone]  Physical Therapy Evaluation  Patient Details  Name: Christina Schwartz MRN: [id number] Date of Birth: August 03, 1970 Referring Provider (PT): Erskine Emery, PA-C    Encounter Date: 11/09/2020   PT End of Session - 11/09/20 1941    Visit Number 1    Number of Visits 12    Date for PT Re-Evaluation 01/04/21    Authorization Type [employer]    PT Start Time 7062    PT Stop Time 1830    PT Time Calculation (min) 41 min    Activity Tolerance Patient tolerated treatment well    Behavior During Therapy [facility] for tasks assessed/performed           Past Medical History:  Diagnosis Date  . Anemia   . Heart murmur   . Hypertension     Past Surgical History:  Procedure Laterality Date  . TUBAL LIGATION    . WISDOM TOOTH EXTRACTION      There were no vitals filed for this visit.   Subjective Assessment - 11/09/20 1759    Subjective Pt reports noticing some R shoulder pain a couple of months ago. She thought it might have been her large purse, so she switched purses and waited a few weeks, but it still persisted. She saw the physician then finally took Aleve, which helped some, but not completely. Currently, reaching for things like the phone at work is painful, donning/doffing her bra and shirts, washing her hair in the shower, and has to use a back reacher now in the shower.    Limitations House hold activities;Other (comment)   reaching   Diagnostic tests XR R shoulder: negative for fracture or any bony abnormalities    Patient Stated Goals Relief, being normal again    Currently in Pain? Yes    Pain Score 0-No pain   7/10 at worst   Pain Location Shoulder    Pain Orientation Right    Pain Descriptors / Indicators Aching;Sore    Pain Type Acute pain    Pain Onset More than a month ago    Pain Frequency Intermittent    Aggravating  Factors  reaching, don/doff bra and shirt, washing back in shower    Pain Relieving Factors Rest, maybe Aleve    Effect of Pain on Daily Activities Tasks are more difficult at work and painful: using L UE instead now for some things              [facility] PT Assessment - 11/09/20 0001      Assessment   Medical Diagnosis Right Shoulder Impingement    Referring Provider (PT) Erskine Emery, PA-C    Onset Date/Surgical Date --   couple of months   Hand Dominance Right    Next MD Visit come back if PT was not working    Prior Therapy None      Precautions   Precautions None      Restrictions   Weight Bearing Restrictions No      Balance Screen   Has the patient fallen in the past 6 months No    Has the patient had a decrease in activity level because of a fear of falling?  No    Is the patient reluctant to leave their home because of a fear of falling?  No      Prior Function   Level of Independence Independent  Vocation Full time employment    Engineer, manufacturing work    Leisure In school for medical office admin, baking, reading      Observation/Other Assessments   Focus on Therapeutic Outcomes (FOTO)  34% ability, 64% predicted      Posture/Postural Control   Posture/Postural Control Postural limitations    Posture Comments Some forward shoulder rounding, mild incr thoracic kyphsosis and foward head      ROM / Strength   AROM / PROM / Strength AROM;PROM;Strength      AROM   AROM Assessment Site Shoulder    Right/Left Shoulder Right;Left    Right Shoulder Flexion 56 Degrees   pain   Right Shoulder ABduction 62 Degrees   pain   Right Shoulder Internal Rotation --   L5   Right Shoulder External Rotation --   occiput   Left Shoulder Flexion 144 Degrees    Left Shoulder ABduction 140 Degrees    Left Shoulder Internal Rotation --   T8   Left Shoulder External Rotation --   T2     PROM   Overall PROM Comments pain with all    PROM Assessment Site Shoulder     Right/Left Shoulder Right;Left    Right Shoulder Flexion 130 Degrees    Right Shoulder ABduction 65 Degrees    Right Shoulder Internal Rotation 52 Degrees    Right Shoulder External Rotation 12 Degrees      Strength   Strength Assessment Site Shoulder    Right/Left Shoulder Right;Left    Right Shoulder Flexion --   NT 2 pain   Right Shoulder ABduction --   NT 2 pain   Right Shoulder Internal Rotation 4/5    Right Shoulder External Rotation 3+/5    Left Shoulder Flexion 5/5    Left Shoulder ABduction 5/5    Left Shoulder Internal Rotation 5/5    Left Shoulder External Rotation 4+/5      Palpation   Palpation comment TTP to anterior R shoulder over biceps insertion and pecs      Special Tests    Special Tests Rotator Cuff Impingement    Other special tests pt unable to perform most testing    Rotator Cuff Impingment tests Neer impingement test;Hawkins- Merrilyn Puma test;Painful Arc of Motion      Neer Impingement test    Findings Positive    Side Right      Hawkins-Kennedy test   Findings Positive    Side Right      Painful Arc of Motion   Findings Positive    Side Right                      Objective measurements completed on examination: See above findings.                 PT Short Term Goals - 11/09/20 1934      PT SHORT TERM GOAL #1   Title Pt will be I and compliant with initial HEP.    Baseline provided at initial eval    Time 3    Period Weeks    Status New    Target Date 11/30/20      PT SHORT TERM GOAL #2   Title Pt will increase AROM R shoulder by 20 degees in flexion, abduction, and ER with <4/10 pain.    Baseline pain with all ROM    Time 3    Period Weeks    Status New  Target Date 11/30/20             PT Long Term Goals - 11/09/20 1936      PT LONG TERM GOAL #1   Title Pt will be independent with advanced HEP for maintenance.    Time 8    Period Weeks    Status New    Target Date 01/04/21      PT LONG TERM  GOAL #2   Title Pt will demonstrate pain free R shoulder AROM WFL.    Baseline see flowsheet    Time 8    Period Weeks    Status New    Target Date 01/04/21      PT LONG TERM GOAL #3   Title Pt will demo 5/5 L shoulder MMT with no pain.    Baseline see flowsheet    Time 8    Period Weeks    Status New    Target Date 01/04/21      PT LONG TERM GOAL #4   Title Pt will don/doff bra and clothing without pain.    Baseline pain    Time 8    Period Weeks    Status New    Target Date 01/04/21      PT LONG TERM GOAL #5   Title Pt will increase FOTO ability to at least 64% ability, in order to demonstrate meaningful change in perceived level of functional ability.    Baseline 34% ability    Time 8    Period Weeks    Status New    Target Date 01/04/21                  Plan - 11/09/20 1943    Clinical Impression Statement Pt is a 50 yo female who presents to OP PT with 2 months of R shoulder pain of insidious onset. Pt positive for impingement cluster: (+) Neer's, Hawkin's Kennedy, painful arc. Pt's impairments include painful A/PROM, decreased strength, postural limitations, and difficulty performing ADLs and job tasks. Pt was educated on FOTO, diagnosis, prognosis, posture, anatomy, HEP, and POC. She verbalized understanding and consent to tx. She would benefit from skilled PT 1-2x/week for 8 weeks, in order to address impairments to restore pain free mobility and strength to R shoulder for full participation in work and home life.    Personal Factors and Comorbidities Comorbidity 2;Past/Current Experience;Time since onset of injury/illness/exacerbation;Fitness    Comorbidities HTN, heart murmur    Examination-Activity Limitations Bathing;Lift;Reach Overhead;Carry    Examination-Participation Restrictions Occupation;Cleaning;Laundry    Stability/Clinical Decision Making Stable/Uncomplicated    Clinical Decision Making Low    Rehab Potential Good    PT Frequency --   1-2x/week    PT Duration 8 weeks    PT Treatment/Interventions ADLs/Self Care Home Management;Cryotherapy;Electrical Stimulation;Iontophoresis 4mg /ml Dexamethasone;Moist Heat;Therapeutic activities;Therapeutic exercise;Neuromuscular re-education;Patient/family education;Manual techniques;Passive range of motion;Dry needling;Taping;Joint Manipulations;Spinal Manipulations;Vasopneumatic Device    PT Next Visit Plan Assess response to initial HEP and update PRN, progress flexibility and ROM as tolerated, manual as indicated for joint and soft tissue restrictions, periscapular and RC strengthening    PT Home Exercise Plan [email or web address]     Consulted and Agree with Plan of Care Patient           Patient will benefit from skilled therapeutic intervention in order to improve the following deficits and impairments:  Pain,Postural dysfunction,Impaired flexibility,Decreased strength,Improper body mechanics,Impaired perceived functional ability,Decreased range of motion  Visit Diagnosis: Acute pain of right shoulder  Abnormal posture  Muscle weakness (generalized)  Stiffness of right shoulder, not elsewhere classified     Problem List Patient Active Problem List   Diagnosis Date Noted  . Sleep apnea 02/05/2019  . Class 1 obesity due to excess calories with serious comorbidity and body mass index (BMI) of 34.0 to 34.9 in adult 02/05/2019  . Dyslipidemia associated with type 2 diabetes mellitus ([place]) 06/09/2018  . Hypertriglyceridemia 06/09/2018  . Hypertension associated with diabetes ([place]) 08/18/2016  . Iron deficiency anemia due to chronic blood loss 08/18/2016  . Fibroids 05/08/2012  . Menorrhagia 04/15/2012    Izell Mayville, PT, DPT 11/09/2020, 7:52 PM  [facility] [address] [place], [place], [address] Phone: [phone]   Fax:  [phone]  Name: Adela Esteban MRN: [id number] Date of Birth: [birth year]

## 2020-11-09 NOTE — Patient Instructions (Signed)
Access Code: [email or web address]  URL: [email or web address] Date: 11/09/2020 Prepared by: Kathreen Cornfield  Exercises Doorway Pec Stretch at 60 Degrees Abduction with Arm Straight - 1 x daily - 7 x weekly - 3 sets - 10 reps Single Arm Doorway Pec Stretch at 60 Elevation - 1 x daily - 7 x weekly - 3 sets - 10 reps Seated Thoracic Extension with Pectoralis Stretch - 1 x daily - 7 x weekly - 1-2 sets - 10 reps Seated Scapular Retraction - 1 x daily - 7 x weekly - 3 sets - 10 reps

## 2020-11-10 ENCOUNTER — Ambulatory Visit: Payer: 59 | Admitting: Orthopaedic Surgery

## 2020-11-16 ENCOUNTER — Encounter: Payer: 59 | Admitting: Physical Therapy

## 2020-11-16 ENCOUNTER — Ambulatory Visit: Payer: 59 | Admitting: Physical Therapy

## 2020-11-16 ENCOUNTER — Encounter: Payer: Self-pay | Admitting: Physical Therapy

## 2020-11-16 ENCOUNTER — Other Ambulatory Visit: Payer: Self-pay

## 2020-11-16 DIAGNOSIS — R293 Abnormal posture: Secondary | ICD-10-CM

## 2020-11-16 DIAGNOSIS — M6281 Muscle weakness (generalized): Secondary | ICD-10-CM | POA: Diagnosis not present

## 2020-11-16 DIAGNOSIS — M25511 Pain in right shoulder: Secondary | ICD-10-CM

## 2020-11-16 DIAGNOSIS — M25611 Stiffness of right shoulder, not elsewhere classified: Secondary | ICD-10-CM

## 2020-11-16 NOTE — Therapy (Signed)
[place], [place], [address] Phone: [phone]   Fax:  [phone]  Physical Therapy Treatment  Patient Details  Name: Christina Schwartz MRN: [id number] Date of Birth: 28-Feb-1971 Referring Provider (PT): Erskine Emery, PA-C   Encounter Date: 11/16/2020   PT End of Session - 11/16/20 1906    Visit Number 2    Number of Visits 12    Date for PT Re-Evaluation 01/04/21    Authorization Type [employer]    PT Start Time [phone]    PT Stop Time 1910    PT Time Calculation (min) 40 min    Activity Tolerance Patient tolerated treatment well;No increased pain    Behavior During Therapy WFL for tasks assessed/performed           Past Medical History:  Diagnosis Date  . Anemia   . Heart murmur   . Hypertension     Past Surgical History:  Procedure Laterality Date  . TUBAL LIGATION    . WISDOM TOOTH EXTRACTION      There were no vitals filed for this visit.   Subjective Assessment - 11/16/20 1832    Subjective Patient reports moderate compliance with HEP, "It was harder than I thought."    Limitations House hold activities    Currently in Pain? No/denies   Max this week = 8/10 during the exercise, lifting, moving.   Pain Score --   Max = 8/10   Pain Location Shoulder    Pain Orientation Right    Pain Descriptors / Indicators Aching    Pain Frequency Intermittent    Pain Relieving Factors Rest              [facility] PT Assessment - 11/16/20 0001      Assessment   Medical Diagnosis Right Shoulder Impingement    Referring Provider (PT) Erskine Emery, PA-C                         [facility] Adult PT Treatment/Exercise - 11/16/20 0001      Shoulder Exercises: Standing   Theraband Level (Shoulder External Rotation) Level 1 (Yellow)    External Rotation Limitations x10reps, 2 sets    Theraband Level (Shoulder Internal Rotation) Level 1 (Yellow)    Internal Rotation Limitations  x10reps, 2 sets    Theraband Level (Shoulder Extension) Level 1 (Yellow)    Extension Limitations x10reps, 2 sets    Theraband Level (Shoulder Row) Level 1 (Yellow)    Row Limitations x10reps, 2 sets      Manual Therapy   Manual Therapy Joint mobilization;Soft tissue mobilization    Joint Mobilization humerus on glenoid PA and inferior glides, scap upward rotation    Soft tissue mobilization R Ant and lat GH region, UT, mid scap and periscap.                  PT Education - 11/16/20 1853    Education Details Reviewed initial HEP, added shoulder IR/ER/EXT/scap retraction with YELLOW Tband    Person(s) Educated Patient    Methods Demonstration;Handout;Verbal cues    Comprehension Verbalized understanding;Returned demonstration            PT Short Term Goals - 11/16/20 1921      PT SHORT TERM GOAL #1   Title Pt will be I and compliant with initial HEP.    Status On-going      PT SHORT TERM GOAL #2  Title Pt will increase AROM R shoulder by 20 degees in flexion, abduction, and ER with <4/10 pain.    Status On-going             PT Long Term Goals - 11/16/20 1922      PT LONG TERM GOAL #1   Title Pt will be independent with advanced HEP for maintenance.    Status On-going      PT LONG TERM GOAL #2   Title Pt will demonstrate pain free R shoulder AROM WFL.    Status On-going      PT LONG TERM GOAL #3   Title Pt will demo 5/5 L shoulder MMT with no pain.    Status On-going      PT LONG TERM GOAL #4   Title Pt will don/doff bra and clothing without pain.    Status On-going      PT LONG TERM GOAL #5   Title Pt will increase FOTO ability to at least 64% ability, in order to demonstrate meaningful change in perceived level of functional ability.    Status On-going                 Plan - 11/16/20 1914    Clinical Impression Statement Patient reports continued increase in pain to 8/10 with activity, improves with rest, has not had to take pain  medication this week.  Patient IR ROM improved with inferior Humerus on Glenoid joint mobs. Patient given shoulder IR/ER/EXT strengthening with Yellow band to improve GH stability. Patient continues to be challenge by pain especially with elevation in all planes.  Educated patient on not performing or modifying exercises if they cause pain but continue daily in painfree range.  Patient will benefit from continued skilled PT to address deficits and maximize use of R UE for daily activity with less pain.    Examination-Activity Limitations Bathing;Lift;Reach Overhead;Carry    Examination-Participation Restrictions Occupation;Cleaning;Laundry    PT Treatment/Interventions ADLs/Self Care Home Management;Cryotherapy;Electrical Stimulation;Iontophoresis 4mg /ml Dexamethasone;Moist Heat;Therapeutic activities;Therapeutic exercise;Neuromuscular re-education;Patient/family education;Manual techniques;Passive range of motion;Dry needling;Taping;Joint Manipulations;Spinal Manipulations;Vasopneumatic Device    PT Next Visit Plan Continue to assess response to new exercises and modifications suggested at previous visit.  Manual as indicated for R shoulder joint mobility and soft tissue restrictions, as well as strengthening for [place] stability with elevation.    PT Home Exercise Plan [id number]            Patient will benefit from skilled therapeutic intervention in order to improve the following deficits and impairments:  Pain,Postural dysfunction,Impaired flexibility,Decreased strength,Improper body mechanics,Impaired perceived functional ability,Decreased range of motion  Visit Diagnosis: Acute pain of right shoulder  Abnormal posture  Muscle weakness (generalized)  Stiffness of right shoulder, not elsewhere classified     Problem List Patient Active Problem List   Diagnosis Date Noted  . Sleep apnea 02/05/2019  . Class 1 obesity due to excess calories with serious comorbidity and body mass index (BMI)  of 34.0 to 34.9 in adult 02/05/2019  . Dyslipidemia associated with type 2 diabetes mellitus ([place]) 06/09/2018  . Hypertriglyceridemia 06/09/2018  . Hypertension associated with diabetes ([place]) 08/18/2016  . Iron deficiency anemia due to chronic blood loss 08/18/2016  . Fibroids 05/08/2012  . Menorrhagia 04/15/2012    Pollyann Samples, PT 11/16/2020, 7:33 PM  [facility] [address] [place], [place], [address] Phone: [phone]   Fax:  [phone]  Name: Shlonda Dolloff MRN: [id number] Date of Birth: 1970/12/10

## 2020-11-23 ENCOUNTER — Ambulatory Visit: Payer: 59

## 2020-11-23 ENCOUNTER — Encounter: Payer: 59 | Admitting: Physical Therapy

## 2020-11-23 ENCOUNTER — Other Ambulatory Visit: Payer: Self-pay

## 2020-11-23 DIAGNOSIS — R293 Abnormal posture: Secondary | ICD-10-CM

## 2020-11-23 DIAGNOSIS — M25611 Stiffness of right shoulder, not elsewhere classified: Secondary | ICD-10-CM

## 2020-11-23 DIAGNOSIS — M25511 Pain in right shoulder: Secondary | ICD-10-CM

## 2020-11-23 DIAGNOSIS — M6281 Muscle weakness (generalized): Secondary | ICD-10-CM | POA: Diagnosis not present

## 2020-11-23 NOTE — Therapy (Signed)
[place], [place], [address] Phone: [phone]   Fax:  [phone]  Physical Therapy Treatment  Patient Details  Name: Elizabella Nolet MRN: [id number] Date of Birth: 1971/02/25 Referring Provider (PT): Erskine Emery, PA-C   Encounter Date: 11/23/2020   PT End of Session - 11/23/20 1842    Visit Number 3    Number of Visits 12    Date for PT Re-Evaluation 01/04/21    Authorization Type [employer]    PT Start Time [phone]    PT Stop Time 1914    PT Time Calculation (min) 40 min    Activity Tolerance Patient tolerated treatment well;No increased pain    Behavior During Therapy WFL for tasks assessed/performed           Past Medical History:  Diagnosis Date  . Anemia   . Heart murmur   . Hypertension     Past Surgical History:  Procedure Laterality Date  . TUBAL LIGATION    . WISDOM TOOTH EXTRACTION      There were no vitals filed for this visit.   Subjective Assessment - 11/23/20 1837    Subjective Pt reports her HEP is getting a little easier, but she was not as compliant due to her vacation in [place], [place].    Limitations House hold activities    Currently in Pain? No/denies    Pain Score 0-No pain    Pain Location Shoulder                             [facility] Adult PT Treatment/Exercise - 11/23/20 0001      Self-Care   Self-Care Other Self-Care Comments    Other Self-Care Comments  see pt edu      Exercises   Exercises Shoulder      Shoulder Exercises: Supine   Diagonals 12 reps;Right    Theraband Level (Shoulder Diagonals) Level 1 (Yellow)    Diagonals Limitations manual approximation of scapula to assist with upward rotation      Shoulder Exercises: Sidelying   Other Sidelying Exercises B thoracic rotation with HBH      Shoulder Exercises: Standing   Theraband Level (Shoulder External Rotation) Level 1 (Yellow)    External Rotation Limitations  walkouts x 5 B   towel roll under arm   Extension Strengthening;Both;12 reps   max TCs and VCs for extended elbows and shoulder neutral positioning   Theraband Level (Shoulder Extension) Level 1 (Yellow)    Extension Limitations anchored over door                  PT Education - 11/23/20 1928    Education Details added open books and supine PNF D2 flexion with yellow band    Person(s) Educated Patient    Methods Demonstration;Explanation;Tactile cues;Verbal cues;Handout    Comprehension Verbalized understanding;Returned demonstration;Verbal cues required;Tactile cues required            PT Short Term Goals - 11/16/20 1921      PT SHORT TERM GOAL #1   Title Pt will be I and compliant with initial HEP.    Status On-going      PT SHORT TERM GOAL #2   Title Pt will increase AROM R shoulder by 20 degees in flexion, abduction, and ER with <4/10 pain.    Status On-going             PT  Long Term Goals - 11/16/20 1922      PT LONG TERM GOAL #1   Title Pt will be independent with advanced HEP for maintenance.    Status On-going      PT LONG TERM GOAL #2   Title Pt will demonstrate pain free R shoulder AROM WFL.    Status On-going      PT LONG TERM GOAL #3   Title Pt will demo 5/5 L shoulder MMT with no pain.    Status On-going      PT LONG TERM GOAL #4   Title Pt will don/doff bra and clothing without pain.    Status On-going      PT LONG TERM GOAL #5   Title Pt will increase FOTO ability to at least 64% ability, in order to demonstrate meaningful change in perceived level of functional ability.    Status On-going                 Plan - 11/23/20 1842    Clinical Impression Statement Pt presents with no pain in R shoulder today, but continued R shoulder elevation/hiking with flexion > abduction. She tolerated treatment well with no adverse reactions, struggling most with PFN D2 flexion and standing that was d/c'ed and altered to supine with improved form  and decreased UT compensation. Pt often has downward gaze with standing exercises and internally rotate shoulder with any pulling exercise. Focused on R shoulder positioning and cervical neutral to decrease forward shoulder rounding and activate posterior cuff.    Examination-Activity Limitations Bathing;Lift;Reach Overhead;Carry    Examination-Participation Restrictions Occupation;Cleaning;Laundry    PT Treatment/Interventions ADLs/Self Care Home Management;Cryotherapy;Electrical Stimulation;Iontophoresis 4mg /ml Dexamethasone;Moist Heat;Therapeutic activities;Therapeutic exercise;Neuromuscular re-education;Patient/family education;Manual techniques;Passive range of motion;Dry needling;Taping;Joint Manipulations;Spinal Manipulations;Vasopneumatic Device    PT Next Visit Plan Continue to assess response to new exercises and modifications suggested at previous visit.  Manual as indicated for R shoulder joint mobility and soft tissue restrictions, as well as strengthening for [place] stability with elevation.    PT Home Exercise Plan [id number]            Patient will benefit from skilled therapeutic intervention in order to improve the following deficits and impairments:  Pain,Postural dysfunction,Impaired flexibility,Decreased strength,Improper body mechanics,Impaired perceived functional ability,Decreased range of motion  Visit Diagnosis: Acute pain of right shoulder  Abnormal posture  Muscle weakness (generalized)  Stiffness of right shoulder, not elsewhere classified     Problem List Patient Active Problem List   Diagnosis Date Noted  . Sleep apnea 02/05/2019  . Class 1 obesity due to excess calories with serious comorbidity and body mass index (BMI) of 34.0 to 34.9 in adult 02/05/2019  . Dyslipidemia associated with type 2 diabetes mellitus ([place]) 06/09/2018  . Hypertriglyceridemia 06/09/2018  . Hypertension associated with diabetes ([place]) 08/18/2016  . Iron deficiency anemia due to chronic  blood loss 08/18/2016  . Fibroids 05/08/2012  . Menorrhagia 04/15/2012    Izell [place], PT, DPT 11/23/2020, 7:32 PM  [facility] [address] [place], [place], [address] Phone: [phone]   Fax:  [phone]  Name: Maple Odaniel MRN: [id number] Date of Birth: 1971/06/04

## 2020-11-30 ENCOUNTER — Other Ambulatory Visit: Payer: Self-pay

## 2020-11-30 ENCOUNTER — Ambulatory Visit: Payer: 59 | Admitting: Physical Therapy

## 2020-11-30 ENCOUNTER — Encounter: Payer: Self-pay | Admitting: Physical Therapy

## 2020-11-30 DIAGNOSIS — M25611 Stiffness of right shoulder, not elsewhere classified: Secondary | ICD-10-CM

## 2020-11-30 DIAGNOSIS — R293 Abnormal posture: Secondary | ICD-10-CM

## 2020-11-30 DIAGNOSIS — M25511 Pain in right shoulder: Secondary | ICD-10-CM

## 2020-11-30 DIAGNOSIS — M6281 Muscle weakness (generalized): Secondary | ICD-10-CM | POA: Diagnosis not present

## 2020-11-30 NOTE — Therapy (Signed)
[place], [place], [address] Phone: [phone]   Fax:  [phone]  Physical Therapy Treatment  Patient Details  Name: Christina Schwartz MRN: [id number] Date of Birth: 12-25-1970 Referring Provider (PT): Erskine Emery, PA-C   Encounter Date: 11/30/2020   PT End of Session - 11/30/20 1839    Visit Number 4    Number of Visits 12    Date for PT Re-Evaluation 01/04/21    Authorization Type [employer]    Progress Note Due on Visit 10    PT Start Time 1800    PT Stop Time 1842    PT Time Calculation (min) 42 min    Activity Tolerance Patient tolerated treatment well;No increased pain   Reports fatigue at end of treatment.   Behavior During Therapy [facility] for tasks assessed/performed           Past Medical History:  Diagnosis Date  . Anemia   . Heart murmur   . Hypertension     Past Surgical History:  Procedure Laterality Date  . TUBAL LIGATION    . WISDOM TOOTH EXTRACTION      There were no vitals filed for this visit.   Subjective Assessment - 11/30/20 1801    Subjective Patient reports good compliance, states she may have overdone it with too much at one time. Patient states the therapy seems to be helping.    Currently in Pain? No/denies   Max = 5/10 after the exercise   Pain Location Shoulder    Pain Orientation Right    Pain Descriptors / Indicators Aching              [facility] PT Assessment - 11/30/20 0001      Assessment   Medical Diagnosis Right Shoulder Impingement    Referring Provider (PT) Erskine Emery, PA-C                         [facility] [place] [facility] Adult PT Treatment/Exercise - 11/30/20 0001      Shoulder Exercises: Supine   Diagonals 12 reps;Right    Theraband Level (Shoulder Diagonals) Level 1 (Yellow)    Diagonals Limitations manual approximation of scapula to assist with upward rotation      Shoulder Exercises: Standing   Theraband Level (Shoulder External  Rotation) Level 1 (Yellow)   B x10   External Rotation Limitations walkouts x 5 B    Theraband Level (Shoulder Extension) Level 1 (Yellow)    Theraband Level (Shoulder Row) Level 1 (Yellow)    Row Limitations x10reps, 2 sets    Other Standing Exercises wall ball up/down, R/L x30sec ea    Other Standing Exercises B ball roll out on table      Manual Therapy   Manual Therapy Joint mobilization;Soft tissue mobilization    Joint Mobilization humerus on glenoid PA and inferior glides, scap upward rotation    Soft tissue mobilization R Ant and lat GH region, UT, mid scap and periscap.                  PT Education - 11/30/20 1831    Education Details Reviewed current HEP for issues, no new exercises given today.  Educated patient to avoid performing exercises into painful range and if irritated after an activity to use ice to calm down the pain.    Person(s) Educated Patient    Methods Explanation    Comprehension Returned demonstration;Verbal cues required;Verbalized understanding  PT Short Term Goals - 11/16/20 1921      PT SHORT TERM GOAL #1   Title Pt will be I and compliant with initial HEP.    Status On-going      PT SHORT TERM GOAL #2   Title Pt will increase AROM R shoulder by 20 degees in flexion, abduction, and ER with <4/10 pain.    Status On-going             PT Long Term Goals - 11/16/20 1922      PT LONG TERM GOAL #1   Title Pt will be independent with advanced HEP for maintenance.    Status On-going      PT LONG TERM GOAL #2   Title Pt will demonstrate pain free R shoulder AROM WFL.    Status On-going      PT LONG TERM GOAL #3   Title Pt will demo 5/5 L shoulder MMT with no pain.    Status On-going      PT LONG TERM GOAL #4   Title Pt will don/doff bra and clothing without pain.    Status On-going      PT LONG TERM GOAL #5   Title Pt will increase FOTO ability to at least 64% ability, in order to demonstrate meaningful change in  perceived level of functional ability.    Status On-going                 Plan - 11/30/20 1848    Clinical Impression Statement Patient reports she is now able to reach the phone without pain.  Patient able to complete treatment today with no increased pain, reporting muscle fatigue.  Patient is progressing with decreased max intensity and frequency of pain.  Patient continues to be challenged by pain at end range of shoulder elevation in all planes.  Patient will benefit from continued skilled PT to address deficits and increased shoulder stability to allow functional use of R UE for daily activity without pain.    Comorbidities HTN, heart murmur    Examination-Activity Limitations Bathing;Lift;Reach Overhead;Carry    Examination-Participation Restrictions Occupation;Cleaning;Laundry    PT Treatment/Interventions ADLs/Self Care Home Management;Cryotherapy;Electrical Stimulation;Iontophoresis 4mg /ml Dexamethasone;Moist Heat;Therapeutic activities;Therapeutic exercise;Neuromuscular re-education;Patient/family education;Manual techniques;Passive range of motion;Dry needling;Taping;Joint Manipulations;Spinal Manipulations;Vasopneumatic Device    PT Next Visit Plan Continue to assess response to new exercises and modifications suggested at previous visit.  Manual as indicated for R shoulder joint mobility and soft tissue restrictions, as well as strengthening for [place] stability with elevation.    PT Home Exercise Plan [id number]     Consulted and Agree with Plan of Care Patient           Patient will benefit from skilled therapeutic intervention in order to improve the following deficits and impairments:  Pain,Postural dysfunction,Impaired flexibility,Decreased strength,Improper body mechanics,Impaired perceived functional ability,Decreased range of motion  Visit Diagnosis: Acute pain of right shoulder  Abnormal posture  Muscle weakness (generalized)  Stiffness of right shoulder, not  elsewhere classified     Problem List Patient Active Problem List   Diagnosis Date Noted  . Sleep apnea 02/05/2019  . Class 1 obesity due to excess calories with serious comorbidity and body mass index (BMI) of 34.0 to 34.9 in adult 02/05/2019  . Dyslipidemia associated with type 2 diabetes mellitus ([place]) 06/09/2018  . Hypertriglyceridemia 06/09/2018  . Hypertension associated with diabetes ([place]) 08/18/2016  . Iron deficiency anemia due to chronic blood loss 08/18/2016  . Fibroids 05/08/2012  . Menorrhagia 04/15/2012  Pollyann Samples, PT 11/30/2020, 6:56 PM  [facility] [address] [place], [place], [address] Phone: [phone]   Fax:  [phone]  Name: Wanette Robison MRN: [id number] Date of Birth: [birth year]

## 2020-12-02 NOTE — Patient Instructions (Addendum)
Please continue using your CPAP regularly. While your insurance requires that you use CPAP at least 4 hours each night on 70% of the nights, I recommend, that you not skip any nights and use it throughout the night if you can. Getting used to CPAP and staying with the treatment long term does take time and patience and discipline. Untreated obstructive sleep apnea when it is moderate to severe can have an adverse impact on cardiovascular health and raise her risk for heart disease, arrhythmias, hypertension, congestive heart failure, stroke and diabetes. Untreated obstructive sleep apnea causes sleep disruption, nonrestorative sleep, and sleep deprivation. This can have an impact on your day to day functioning and cause daytime sleepiness and impairment of cognitive function, memory loss, mood disturbance, and problems focussing. Using CPAP regularly can improve these symptoms.   Follow up in 1 year   Sleep Apnea Sleep apnea affects breathing during sleep. It causes breathing to stop for a short time or to become shallow. It can also increase the risk of:  Heart attack.  Stroke.  Being very overweight (obese).  Diabetes.  Heart failure.  Irregular heartbeat. The goal of treatment is to help you breathe normally again. What are the causes? There are three kinds of sleep apnea:  Obstructive sleep apnea. This is caused by a blocked or collapsed airway.  Central sleep apnea. This happens when the brain does not send the right signals to the muscles that control breathing.  Mixed sleep apnea. This is a combination of obstructive and central sleep apnea. The most common cause of this condition is a collapsed or blocked airway. This can happen if:  Your throat muscles are too relaxed.  Your tongue and tonsils are too large.  You are overweight.  Your airway is too small.   What increases the risk?  Being overweight.  Smoking.  Having a small airway.  Being older.  Being  female.  Drinking alcohol.  Taking medicines to calm yourself (sedatives or tranquilizers).  Having family members with the condition. What are the signs or symptoms?  Trouble staying asleep.  Being sleepy or tired during the day.  Getting angry a lot.  Loud snoring.  Headaches in the morning.  Not being able to focus your mind (concentrate).  Forgetting things.  Less interest in sex.  Mood swings.  Personality changes.  Feelings of sadness (depression).  Waking up a lot during the night to pee (urinate).  Dry mouth.  Sore throat. How is this diagnosed?  Your medical history.  A physical exam.  A test that is done when you are sleeping (sleep study). The test is most often done in a sleep lab but may also be done at home. How is this treated?  Sleeping on your side.  Using a medicine to get rid of mucus in your nose (decongestant).  Avoiding the use of alcohol, medicines to help you relax, or certain pain medicines (narcotics).  Losing weight, if needed.  Changing your diet.  Not smoking.  Using a machine to open your airway while you sleep, such as: ? An oral appliance. This is a mouthpiece that shifts your lower jaw forward. ? A CPAP device. This device blows air through a mask when you breathe out (exhale). ? An EPAP device. This has valves that you put in each nostril. ? A BPAP device. This device blows air through a mask when you breathe in (inhale) and breathe out.  Having surgery if other treatments do not work. It   is important to get treatment for sleep apnea. Without treatment, it can lead to:  High blood pressure.  Coronary artery disease.  In men, not being able to have an erection (impotence).  Reduced thinking ability.   Follow these instructions at home: Lifestyle  Make changes that your doctor recommends.  Eat a healthy diet.  Lose weight if needed.  Avoid alcohol, medicines to help you relax, and some pain  medicines.  Do not use any products that contain nicotine or tobacco, such as cigarettes, e-cigarettes, and chewing tobacco. If you need help quitting, ask your doctor. General instructions  Take over-the-counter and prescription medicines only as told by your doctor.  If you were given a machine to use while you sleep, use it only as told by your doctor.  If you are having surgery, make sure to tell your doctor you have sleep apnea. You may need to bring your device with you.  Keep all follow-up visits as told by your doctor. This is important. Contact a doctor if:  The machine that you were given to use during sleep bothers you or does not seem to be working.  You do not get better.  You get worse. Get help right away if:  Your chest hurts.  You have trouble breathing in enough air.  You have an uncomfortable feeling in your back, arms, or stomach.  You have trouble talking.  One side of your body feels weak.  A part of your face is hanging down. These symptoms may be an emergency. Do not wait to see if the symptoms will go away. Get medical help right away. Call your local emergency services (911 in the [place] Do not drive yourself to the hospital. Summary  This condition affects breathing during sleep.  The most common cause is a collapsed or blocked airway.  The goal of treatment is to help you breathe normally while you sleep. This information is not intended to replace advice given to you by your health care provider. Make sure you discuss any questions you have with your health care provider. Document Revised: 04/12/2018 Document Reviewed: 02/19/2018 Elsevier Patient Education  2021 Elsevier Inc.  

## 2020-12-02 NOTE — Progress Notes (Addendum)
PATIENT: Christina Schwartz DOB: [birth year]  REASON FOR VISIT: follow up HISTORY FROM: patient  Chief Complaint  Patient presents with   Follow-up    RM 2, alone. Reports doing well with her CPAP therapy. No issues or concern.      HISTORY OF PRESENT ILLNESS: 12/07/2020 ALL:  She returns for follow up for OSA on CPAP. She continues to do very well on CPAP. She is using CPAP nightly and more than 4 hours every night. She does continue to note benefit with CPAP. She denies concerns with CPAP machine or supplies.      07/30/2019 ALL:  Christina Schwartz is a 50 y.o. female here today for follow up for OSA on CPAP.  Sleep study revealed severe OSA with AHI of 47.5/h and REM AHI of 78/h. O2 nadir of 78%.  She returns today for her initial CPAP review.  She reports that she is doing very well on CPAP therapy.  She denies any difficulty with her machine.  She is currently using a fullface mask but is interested in trying nasal pillows.  No difficulty with current mask but that she might prefer a nasal pillow.  She has noted significant improvements in sleep quality and daytime energy levels.  Compliance report dated 06/29/2019 through 07/28/2019 reveals that she used CPAP 30 out of the last 30 days for compliance of 100%.  28 of the last 30 days she used CPAP greater than 4 hours for compliance of 93%.  Average usage was 6 hours and 49 minutes.  Residual AHI was 3.5 on a set pressure of 8 cm of water and an EPR of 3.  There was no significant leak noted.  HISTORY: (copied from Dr Christina Schwartz note on 02/13/2019)  Dear Dr. Holly Schwartz,    I saw your patient, Christina Schwartz, upon your kind request in my sleep clinic today for initial consultation of her sleep disturbance, in particular, concern for underlying obstructive sleep apnea.  The patient is unaccompanied today.  As you know, Christina Schwartz is a 50 year old right-handed woman with an underlying medical history of hypertension, diabetes, hypertriglyceridemia,  anemia and obesity, who reports snoring and excessive daytime somnolence.  She has a family history of sleep apnea.  I reviewed your office note from 02/05/2019. Her Epworth sleepiness score is 13 out of 24, fatigue severity score is 48 out of 63.  She is typically in bed between 930 and 10 and rise time is around 6.  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  She lives alone and for grown children.  She has no pets in the household.  Recently when she stayed at her mother's house, her sister-in-law noticed apneas while patient was asleep.  They shared a room at the time.  Her brother has sleep apnea and uses a CPAP machine as well as her sister and she also reports that her father had sleep apnea, he passed away at age 33.  She has rare morning headaches, no night to night nocturia.  She denies any telltale symptoms of restless leg syndrome.  She would be willing to get tested for sleep apnea and consider CPAP therapy.  She is trying to lose weight through weight watchers and has lost 5 pounds thus far.  She is a non-smoker and does not drink alcohol and drinks caffeine and limitation, occasional soda, no daily coffee or tea.    REVIEW OF SYSTEMS: Out of a complete 14 system review of  symptoms, the patient complains only of the following symptoms, none and all other reviewed systems are negative.  Epworth sleepiness scale: 2 Fatigue severity scale: 9  ALLERGIES: No Known Allergies  HOME MEDICATIONS: Outpatient Medications Prior to Visit  Medication Sig Dispense Refill   atenolol (TENORMIN) 50 MG tablet TAKE 1 TABLET (50 MG TOTAL) BY MOUTH DAILY. 90 tablet 3   Blood Glucose Monitoring Suppl (FREESTYLE LITE) DEVI      FREESTYLE LITE test strip      Iron-Vitamins (GERITOL PO) Take 1 tablet by mouth daily.     Lancets (FREESTYLE) lancets      metFORMIN (GLUCOPHAGE-XR) 500 MG 24 hr tablet TAKE 1 TABLET (500 MG TOTAL) BY MOUTH 2 (TWO) TIMES DAILY WITH A MEAL.  180 tablet 3   ACCU-CHEK GUIDE test strip USE AS INSTRUCTED 100 strip 0   lisinopril-hydrochlorothiazide (ZESTORETIC) 20-12.5 MG tablet TAKE 1 TABLET BY MOUTH DAILY. 90 tablet 3   rosuvastatin (CRESTOR) 10 MG tablet TAKE 1 TABLET (10 MG TOTAL) BY MOUTH DAILY. 90 tablet 3   No facility-administered medications prior to visit.    PAST MEDICAL HISTORY: Past Medical History:  Diagnosis Date   Anemia    Heart murmur    Hypertension     PAST SURGICAL HISTORY: Past Surgical History:  Procedure Laterality Date   TUBAL LIGATION     WISDOM TOOTH EXTRACTION      FAMILY HISTORY: Family History  Problem Relation Age of Onset   Hypertension Father    Pulmonary embolism Father    Diabetes Maternal Grandmother    Diabetes Sister    Lymphoma Sister     SOCIAL HISTORY: Social History   Socioeconomic History   Marital status: Legally Separated    Spouse name: bruce   Number of children: 4   Years of education: some college   Highest education level: Not on file  Occupational History   Occupation: environmental services    Employer: [employer]  Tobacco Use   Smoking status: Never Smoker   Smokeless tobacco: Never Used  Scientific laboratory technician Use: Never used  Substance and Sexual Activity   Alcohol use: No   Drug use: No   Sexual activity: Yes    Partners: Male    Birth control/protection: None, Surgical    Comment: BTL  Other Topics Concern   Not on file  Social History Narrative   Lives with 4 children.      No gums in home.   Wears seatbelt.   Social Determinants of Health   Financial Resource Strain: Not on file  Food Insecurity: Not on file  Transportation Needs: Not on file  Physical Activity: Not on file  Stress: Not on file  Social Connections: Not on file  Intimate Partner Violence: Not on file      PHYSICAL EXAM  Vitals:   12/07/20 1020  BP: 116/82  Pulse: 62  Weight: 234 lb (106.1 kg)  Height: 5\' 6"  (1.676 m)   Body mass index is 37.77  kg/m.  Generalized: Well developed, in no acute distress  Cardiology: normal rate and rhythm, no murmur noted Respiratory: Clear to auscultation bilaterally Neurological examination  Mentation: Alert oriented to time, place, history taking. Follows all commands speech and language fluent Cranial nerve II-XII: Pupils were equal round reactive to light. Extraocular movements were full, visual field were full  Motor: The motor testing reveals 5 over 5 strength of all 4 extremities. Good symmetric motor tone is noted throughout.  Gait and station: Gait is normal.   DIAGNOSTIC DATA (LABS, IMAGING, TESTING) - I reviewed patient records, labs, notes, testing and imaging myself where available.  No flowsheet data found.   Lab Results  Component Value Date   WBC 4.4 (A) 07/14/2019   HGB 12 07/14/2019   HCT 38.4 07/14/2019   MCV 75.3 (A) 07/14/2019   PLT 288 01/01/2019      Component Value Date/Time   NA 139 09/09/2020 1354   K 3.6 09/09/2020 1354   CL 99 09/09/2020 1354   CO2 25 09/09/2020 1354   GLUCOSE 89 09/09/2020 1354   GLUCOSE 119 (H) 07/22/2015 2002   BUN 16 09/09/2020 1354   CREATININE 0.84 09/09/2020 1354   CALCIUM 9.5 09/09/2020 1354   PROT 7.0 09/09/2020 1354   ALBUMIN 4.0 09/09/2020 1354   AST 25 09/09/2020 1354   ALT 31 09/09/2020 1354   ALKPHOS 69 09/09/2020 1354   BILITOT 0.5 09/09/2020 1354   GFRNONAA 85 07/14/2019 1621   GFRAA 98 07/14/2019 1621   Lab Results  Component Value Date   CHOL 159 09/09/2020   HDL 39 (L) 09/09/2020   LDLCALC 89 09/09/2020   TRIG 178 (H) 09/09/2020   CHOLHDL 4.1 09/09/2020   Lab Results  Component Value Date   HGBA1C 7.0 (A) 09/09/2020   Lab Results  Component Value Date   ZOXWRUEA54 098 05/31/2018   Lab Results  Component Value Date   TSH 0.849 11/27/2017       ASSESSMENT AND PLAN 50 y.o. year old female  has a past medical history of Anemia, Heart murmur, and Hypertension. here with     ICD-10-CM   1. OSA on  CPAP  G47.33 For home use only DME continuous positive airway pressure (CPAP)   Z99.89     [place] was doing very well on CPAP therapy.  She reports significant improvement in sleep quality and daytime energy.  Compliance report reveals excellent compliance.  She was encouraged to continue using CPAP nightly and for greater than 4 hours each night.  We will send a new order today for supplies  She was encouraged to continue healthy lifestyle habits. She will follow-up with me in 1 year, sooner if needed.  She verbalizes understanding and agreement with this plan.     Orders Placed This Encounter  Procedures   For home use only DME continuous positive airway pressure (CPAP)    Supplies    Order Specific Question:   Length of Need    Answer:   Lifetime    Order Specific Question:   Patient has OSA or probable OSA    Answer:   Yes    Order Specific Question:   Is the patient currently using CPAP in the home    Answer:   Yes    Order Specific Question:   Settings    Answer:   Other see comments    Order Specific Question:   CPAP supplies needed    Answer:   Mask, headgear, cushions, filters, heated tubing and water chamber     No orders of the defined types were placed in this encounter.       Debbora Presto, FNP-C 12/07/2020, 10:53 AM [facility] [address] [place], [place] [address] [phone]  I reviewed the above note and documentation by the Nurse Practitioner and agree with the history, exam, assessment and plan as outlined above. I was available for consultation. Star Age, MD, PhD [facility] ([facility])

## 2020-12-07 ENCOUNTER — Encounter: Payer: Self-pay | Admitting: Family Medicine

## 2020-12-07 ENCOUNTER — Ambulatory Visit (INDEPENDENT_AMBULATORY_CARE_PROVIDER_SITE_OTHER): Payer: 59 | Admitting: Family Medicine

## 2020-12-07 ENCOUNTER — Other Ambulatory Visit: Payer: Self-pay

## 2020-12-07 ENCOUNTER — Encounter: Payer: Self-pay | Admitting: Physical Therapy

## 2020-12-07 ENCOUNTER — Ambulatory Visit: Payer: 59 | Admitting: Physical Therapy

## 2020-12-07 VITALS — BP 116/82 | HR 62 | Ht 66.0 in | Wt 234.0 lb

## 2020-12-07 DIAGNOSIS — M25611 Stiffness of right shoulder, not elsewhere classified: Secondary | ICD-10-CM | POA: Diagnosis not present

## 2020-12-07 DIAGNOSIS — Z9989 Dependence on other enabling machines and devices: Secondary | ICD-10-CM | POA: Diagnosis not present

## 2020-12-07 DIAGNOSIS — M6281 Muscle weakness (generalized): Secondary | ICD-10-CM

## 2020-12-07 DIAGNOSIS — R293 Abnormal posture: Secondary | ICD-10-CM

## 2020-12-07 DIAGNOSIS — M25511 Pain in right shoulder: Secondary | ICD-10-CM | POA: Diagnosis not present

## 2020-12-07 DIAGNOSIS — G4733 Obstructive sleep apnea (adult) (pediatric): Secondary | ICD-10-CM | POA: Diagnosis not present

## 2020-12-07 NOTE — Patient Instructions (Signed)
Try a few reps of standing D2 with yellow band until you can reach up further WITHOUT PAIN.

## 2020-12-07 NOTE — Therapy (Signed)
[facility] [place], [place], [address] Phone: [phone]   Fax:  [phone]  Physical Therapy Treatment  Patient Details  Name: Christina Schwartz MRN: [id number] Date of Birth: [birth year] Referring Provider (PT): Erskine Emery, PA-C   Encounter Date: 12/07/2020   PT End of Session - 12/07/20 1615    Visit Number 5    Number of Visits 12    Date for PT Re-Evaluation 01/04/21    Authorization Type [employer]    PT Start Time 5859    PT Stop Time 2924    PT Time Calculation (min) 38 min    Activity Tolerance Patient tolerated treatment well;No increased pain    Behavior During Therapy WFL for tasks assessed/performed           Past Medical History:  Diagnosis Date  . Anemia   . Heart murmur   . Hypertension     Past Surgical History:  Procedure Laterality Date  . TUBAL LIGATION    . WISDOM TOOTH EXTRACTION      There were no vitals filed for this visit.   Subjective Assessment - 12/07/20 1537    Subjective Patient reports improvement since adjusting HEP performance and spreading out. Max pain this week 2/10.    Limitations House hold activities    Currently in Pain? No/denies    Pain Score --   2/10   Pain Location Shoulder    Pain Orientation Right              [facility] PT Assessment - 12/07/20 0001      Assessment   Medical Diagnosis Right Shoulder Impingement    Referring Provider (PT) Erskine Emery, PA-C                         [facility] Adult PT Treatment/Exercise - 12/07/20 0001      Shoulder Exercises: Supine   Diagonals Limitations tried standing with difficulty      Shoulder Exercises: Standing   External Rotation Limitations walkouts x 5 B    Theraband Level (Shoulder Internal Rotation) Level 2 (Red)    Internal Rotation Limitations x10reps, 2 sets    Extension 10 reps;Both    Theraband Level (Shoulder Extension) Level 2 (Red)    Extension Limitations 2  sets    Row 10 reps    Theraband Level (Shoulder Row) Level 2 (Red)    Row Limitations --   2 sets   Diagonals 10 reps    Theraband Level (Shoulder Diagonals) Level 1 (Yellow)    Diagonals Limitations D2- pain in end range of elevation.    Other Standing Exercises High mat with BOSU dome down push ups with serratus extra press    Other Standing Exercises B ball roll out on table      Shoulder Exercises: ROM/Strengthening   UBE (Upper Arm Bike) L1 x71min   END of tx                 PT Education - 12/07/20 1608    Education Details Continue with current HEP transitioning D2 with yellow band to standing. Will continue with serratus strengthening.    Person(s) Educated Patient    Methods Explanation;Demonstration    Comprehension Verbalized understanding;Returned demonstration            PT Short Term Goals - 11/16/20 1921      PT SHORT TERM GOAL #1   Title Pt will be  I and compliant with initial HEP.    Status On-going      PT SHORT TERM GOAL #2   Title Pt will increase AROM R shoulder by 20 degees in flexion, abduction, and ER with <4/10 pain.    Status On-going             PT Long Term Goals - 11/16/20 1922      PT LONG TERM GOAL #1   Title Pt will be independent with advanced HEP for maintenance.    Status On-going      PT LONG TERM GOAL #2   Title Pt will demonstrate pain free R shoulder AROM WFL.    Status On-going      PT LONG TERM GOAL #3   Title Pt will demo 5/5 L shoulder MMT with no pain.    Status On-going      PT LONG TERM GOAL #4   Title Pt will don/doff bra and clothing without pain.    Status On-going      PT LONG TERM GOAL #5   Title Pt will increase FOTO ability to at least 64% ability, in order to demonstrate meaningful change in perceived level of functional ability.    Status On-going                 Plan - 12/07/20 1616    Clinical Impression Statement Patient reports no pain before or after treatment, max pain this week  much better at 2/10. ROM in R shoulder  IR improved this visit.  Patient overhead reaching continues to be painful unless scapula stabilized.  Will benefit from work on serratus stability and continued skilled PT to address deficits with cuff strengthening.    Comorbidities HTN, heart murmur    Examination-Activity Limitations Bathing;Lift;Reach Overhead;Carry    PT Next Visit Plan Continue to assess response to new exercises and modifications suggested at previous visit.  Manual as indicated for R shoulder joint mobility and soft tissue restrictions, as well as strengthening for [place] Tract stability with elevation.    PT Home Exercise Plan [id number]     Consulted and Agree with Plan of Care Patient           Patient will benefit from skilled therapeutic intervention in order to improve the following deficits and impairments:  Pain,Postural dysfunction,Impaired flexibility,Decreased strength,Improper body mechanics,Impaired perceived functional ability,Decreased range of motion  Visit Diagnosis: Acute pain of right shoulder  Abnormal posture  Muscle weakness (generalized)  Stiffness of right shoulder, not elsewhere classified     Problem List Patient Active Problem List   Diagnosis Date Noted  . Sleep apnea 02/05/2019  . Class 1 obesity due to excess calories with serious comorbidity and body mass index (BMI) of 34.0 to 34.9 in adult 02/05/2019  . Dyslipidemia associated with type 2 diabetes mellitus ([place]) 06/09/2018  . Hypertriglyceridemia 06/09/2018  . Hypertension associated with diabetes ([place]) 08/18/2016  . Iron deficiency anemia due to chronic blood loss 08/18/2016  . Fibroids 05/08/2012  . Menorrhagia 04/15/2012    Pollyann Samples, PT 12/07/2020, 5:44 PM  [facility] [address] [place], [place], [address] Phone: [phone]   Fax:  [phone]  Name: Christina Schwartz MRN: [id number] Date of Birth: 10-07-1970

## 2020-12-09 ENCOUNTER — Ambulatory Visit: Payer: 59 | Admitting: Nurse Practitioner

## 2020-12-09 ENCOUNTER — Other Ambulatory Visit: Payer: Self-pay

## 2020-12-09 ENCOUNTER — Encounter: Payer: Self-pay | Admitting: Nurse Practitioner

## 2020-12-09 ENCOUNTER — Other Ambulatory Visit (HOSPITAL_COMMUNITY): Payer: Self-pay

## 2020-12-09 VITALS — BP 122/76 | HR 63 | Temp 98.3°F | Ht 67.0 in | Wt 235.4 lb

## 2020-12-09 DIAGNOSIS — I152 Hypertension secondary to endocrine disorders: Secondary | ICD-10-CM

## 2020-12-09 DIAGNOSIS — E1165 Type 2 diabetes mellitus with hyperglycemia: Secondary | ICD-10-CM

## 2020-12-09 DIAGNOSIS — E1159 Type 2 diabetes mellitus with other circulatory complications: Secondary | ICD-10-CM | POA: Diagnosis not present

## 2020-12-09 MED ORDER — METFORMIN HCL ER 500 MG PO TB24
ORAL_TABLET | ORAL | 3 refills | Status: DC
  Filled 2020-12-09: qty 180, 90d supply, fill #0
  Filled 2021-03-28: qty 180, 90d supply, fill #1

## 2020-12-09 MED ORDER — LISINOPRIL-HYDROCHLOROTHIAZIDE 10-12.5 MG PO TABS
1.0000 | ORAL_TABLET | Freq: Every day | ORAL | 1 refills | Status: DC
  Filled 2020-12-09: qty 90, 90d supply, fill #0

## 2020-12-09 NOTE — Assessment & Plan Note (Addendum)
HgbA1c of 7.0 She is not taking metformin 500mg  XR BID due to nausea and diarrhea. Advised to resume metformin XR: she is to titrate dose up slowly. If persistent GI side effects, will switch to glipizide. She agreed to nutritionist referral

## 2020-12-09 NOTE — Progress Notes (Signed)
Subjective:  Patient ID: Christina Schwartz, female    DOB: February 27, 1971  Age: 50 y.o. MRN: [id number]  CC: Establish Care (TOC- establish care for HTN/DM.  Needs refills on HCTZ. )  HPI  Hypertension associated with diabetes ([place]) BP at goal Did not take lisinopril HCTZ as prescribed by previous pcp. Advised about renal protection and BP control with lisinopril rx sent BP Readings from Last 3 Encounters:  12/09/20 122/76  12/07/20 116/82  09/09/20 131/82   BMP Latest Ref Rng & Units 09/09/2020 07/14/2019 01/01/2019  Glucose 65 - 99 mg/dL 89 159(H) 126(H)  BUN 6 - 24 mg/dL 16 10 17   Creatinine 0.57 - 1.00 mg/dL 0.84 0.82 0.82  BUN/Creat Ratio 9 - 23 19 12 21   Sodium 134 - 144 mmol/L 139 139 142  Potassium 3.5 - 5.2 mmol/L 3.6 3.6 3.8  Chloride 96 - 106 mmol/L 99 98 102  CO2 20 - 29 mmol/L 25 26 25   Calcium 8.7 - 10.2 mg/dL 9.5 9.0 9.5    Type 2 diabetes mellitus with hyperglycemia, without long-term current use of insulin (HCC) HgbA1c of 7.0 She is not taking metformin 500mg  XR BID due to nausea and diarrhea. Advised to resume metformin XR: she is to titrate dose up slowly. If persistent GI side effects, will switch to glipizide. She agreed to nutritionist referral  BP Readings from Last 3 Encounters:  12/09/20 122/76  12/07/20 116/82  09/09/20 131/82    Reviewed past Medical, Social and Family history today.  Outpatient Medications Prior to Visit  Medication Sig Dispense Refill  . atenolol (TENORMIN) 50 MG tablet TAKE 1 TABLET (50 MG TOTAL) BY MOUTH DAILY. 90 tablet 3  . Blood Glucose Monitoring Suppl (FREESTYLE LITE) DEVI     . FREESTYLE LITE test strip     . Iron-Vitamins (GERITOL PO) Take 1 tablet by mouth daily.    . Lancets (FREESTYLE) lancets     . metFORMIN (GLUCOPHAGE-XR) 500 MG 24 hr tablet TAKE 1 TABLET (500 MG TOTAL) BY MOUTH 2 (TWO) TIMES DAILY WITH A MEAL. (Patient not taking: Reported on 12/09/2020) 180 tablet 3   No facility-administered medications prior to  visit.    ROS See HPI  Objective:  BP 122/76   Pulse 63   Temp 98.3 F (36.8 C) (Temporal)   Ht 5\' 7"  (1.702 m)   Wt 235 lb 6.4 oz (106.8 kg)   SpO2 99%   BMI 36.87 kg/m   Physical Exam Vitals reviewed.  Constitutional:      Appearance: She is obese.  Cardiovascular:     Rate and Rhythm: Normal rate and regular rhythm.     Heart sounds: Murmur heard.    Pulmonary:     Effort: Pulmonary effort is normal.     Breath sounds: Normal breath sounds.  Musculoskeletal:     Right lower leg: Edema present.     Left lower leg: Edema present.  Neurological:     Mental Status: She is alert and oriented to person, place, and time.    Assessment & Plan:  This visit occurred during the SARS-CoV-2 public health emergency.  Safety protocols were in place, including screening questions prior to the visit, additional usage of staff PPE, and extensive cleaning of exam room while observing appropriate contact time as indicated for disinfecting solutions.   Penina was seen today for establish care.  Diagnoses and all orders for this visit:  Hypertension associated with diabetes ([place]) -     lisinopril-hydrochlorothiazide (ZESTORETIC) 10-12.5  MG tablet; Take 1 tablet by mouth daily. -     Referral to Nutrition and Diabetes Services  Type 2 diabetes mellitus with hyperglycemia, without long-term current use of insulin (HCC) -     metFORMIN (GLUCOPHAGE-XR) 500 MG 24 hr tablet; Take 1 tablet (500 mg total) by mouth daily for 14 days with the heaviest meal of day, THEN take 1 tablet (500 mg total) 2 (two) times daily, with breakfast and supper -     Referral to Nutrition and Diabetes Services   Problem List Items Addressed This Visit      Cardiovascular and Mediastinum   Hypertension associated with diabetes ([place]) - Primary    BP at goal Did not take lisinopril HCTZ as prescribed by previous pcp. Advised about renal protection and BP control with lisinopril rx sent BP Readings from  Last 3 Encounters:  12/09/20 122/76  12/07/20 116/82  09/09/20 131/82   BMP Latest Ref Rng & Units 09/09/2020 07/14/2019 01/01/2019  Glucose 65 - 99 mg/dL 89 159(H) 126(H)  BUN 6 - 24 mg/dL 16 10 17   Creatinine 0.57 - 1.00 mg/dL 0.84 0.82 0.82  BUN/Creat Ratio 9 - 23 19 12 21   Sodium 134 - 144 mmol/L 139 139 142  Potassium 3.5 - 5.2 mmol/L 3.6 3.6 3.8  Chloride 96 - 106 mmol/L 99 98 102  CO2 20 - 29 mmol/L 25 26 25   Calcium 8.7 - 10.2 mg/dL 9.5 9.0 9.5        Relevant Medications   metFORMIN (GLUCOPHAGE-XR) 500 MG 24 hr tablet   lisinopril-hydrochlorothiazide (ZESTORETIC) 10-12.5 MG tablet   Other Relevant Orders   Referral to Nutrition and Diabetes Services     Endocrine   Type 2 diabetes mellitus with hyperglycemia, without long-term current use of insulin (HCC)    HgbA1c of 7.0 She is not taking metformin 500mg  XR BID due to nausea and diarrhea. Advised to resume metformin XR: she is to titrate dose up slowly. If persistent GI side effects, will switch to glipizide. She agreed to nutritionist referral      Relevant Medications   metFORMIN (GLUCOPHAGE-XR) 500 MG 24 hr tablet   lisinopril-hydrochlorothiazide (ZESTORETIC) 10-12.5 MG tablet   Other Relevant Orders   Referral to Nutrition and Diabetes Services      Follow-up: Return in about 3 months (around 03/11/2021) for DM and HTN, hyperlipidemia (fasting).  Wilfred Lacy, NP

## 2020-12-09 NOTE — Assessment & Plan Note (Signed)
BP at goal Did not take lisinopril HCTZ as prescribed by previous pcp. Advised about renal protection and BP control with lisinopril rx sent BP Readings from Last 3 Encounters:  12/09/20 122/76  12/07/20 116/82  09/09/20 131/82   BMP Latest Ref Rng & Units 09/09/2020 07/14/2019 01/01/2019  Glucose 65 - 99 mg/dL 89 159(H) 126(H)  BUN 6 - 24 mg/dL 16 10 17   Creatinine 0.57 - 1.00 mg/dL 0.84 0.82 0.82  BUN/Creat Ratio 9 - 23 19 12 21   Sodium 134 - 144 mmol/L 139 139 142  Potassium 3.5 - 5.2 mmol/L 3.6 3.6 3.8  Chloride 96 - 106 mmol/L 99 98 102  CO2 20 - 29 mmol/L 25 26 25   Calcium 8.7 - 10.2 mg/dL 9.5 9.0 9.5

## 2020-12-09 NOTE — Patient Instructions (Addendum)
Resume metformin and lisinopril/HCTZ Call office if persistent GI side effects with metformin XR Stop HCTZ 25mg  You will be contacted to schedule appt with nutritionist F/up in 40months (fasting)

## 2020-12-14 ENCOUNTER — Other Ambulatory Visit: Payer: Self-pay

## 2020-12-14 ENCOUNTER — Encounter: Payer: Self-pay | Admitting: Physical Therapy

## 2020-12-14 ENCOUNTER — Ambulatory Visit: Payer: 59 | Attending: Physician Assistant | Admitting: Physical Therapy

## 2020-12-14 DIAGNOSIS — M25511 Pain in right shoulder: Secondary | ICD-10-CM | POA: Diagnosis not present

## 2020-12-14 DIAGNOSIS — M25611 Stiffness of right shoulder, not elsewhere classified: Secondary | ICD-10-CM | POA: Insufficient documentation

## 2020-12-14 DIAGNOSIS — M6281 Muscle weakness (generalized): Secondary | ICD-10-CM | POA: Diagnosis not present

## 2020-12-14 DIAGNOSIS — R293 Abnormal posture: Secondary | ICD-10-CM | POA: Insufficient documentation

## 2020-12-14 NOTE — Therapy (Signed)
[place], [place], [address] Phone: [phone]   Fax:  [phone]  Physical Therapy Treatment  Patient Details  Name: Christina Schwartz MRN: [id number] Date of Birth: [birth year] Referring Provider (PT): Erskine Emery, PA-C   Encounter Date: 12/14/2020   PT End of Session - 12/14/20 1750    Visit Number 6    Number of Visits 12    Date for PT Re-Evaluation 01/04/21    Authorization Type [employer]    Progress Note Due on Visit 10    PT Start Time 8756    PT Stop Time 1825    PT Time Calculation (min) 38 min    Activity Tolerance Patient tolerated treatment well;No increased pain    Behavior During Therapy WFL for tasks assessed/performed           Past Medical History:  Diagnosis Date  . Anemia   . Heart murmur   . Hypertension     Past Surgical History:  Procedure Laterality Date  . TUBAL LIGATION    . WISDOM TOOTH EXTRACTION      There were no vitals filed for this visit.   Subjective Assessment - 12/14/20 1753    Subjective Patient reports her arm is a little irritated today, 2 back to back long phone calls holding the phone with R arm.    Limitations House hold activities    Diagnostic tests XR R shoulder: negative for fracture or any bony abnormalities    Patient Stated Goals Relief, being normal again    Currently in Pain? Yes    Pain Score 1    Max today = 5/10   Pain Location Shoulder    Pain Orientation Right    Pain Descriptors / Indicators Aching              [facility] PT Assessment - 12/14/20 0001      Assessment   Medical Diagnosis Right Shoulder Impingement    Referring Provider (PT) Erskine Emery, PA-C      PROM   Right Shoulder Flexion 98 Degrees    Right Shoulder ABduction 76 Degrees    Right Shoulder Internal Rotation --   T7   Right Shoulder External Rotation 53 Degrees      Strength   Right Shoulder Internal Rotation 4-/5   pain   Right Shoulder  External Rotation 4-/5                         [facility] Adult PT Treatment/Exercise - 12/14/20 0001      Shoulder Exercises: Seated   External Rotation 5 reps    External Rotation Limitations Yellow walk out    Internal Rotation 5 reps    Internal Rotation Limitations Yellow walk out      Shoulder Exercises: Standing   Row 10 reps    Theraband Level (Shoulder Row) Level 2 (Red)    Row Limitations 2sets      Shoulder Exercises: ROM/Strengthening   Ball on Wall Up/Down, R/L 2x30sec ea      Manual Therapy   Manual Therapy Joint mobilization;Soft tissue mobilization    Joint Mobilization humerus on glenoid PA and inferior glides, scap upward rotation    Soft tissue mobilization R Ant and lat GH region, UT, mid scap and periscap.                  PT Education - 12/14/20 1800    Education  Details Continue with current HEP.  Will continue with serratus strengthening for mild winging.    Person(s) Educated Patient    Methods Explanation;Demonstration    Comprehension Verbalized understanding;Returned demonstration            PT Short Term Goals - 12/14/20 1829      PT SHORT TERM GOAL #1   Title Pt will be I and compliant with initial HEP.    Baseline provided at initial eval    Period Weeks    Status Achieved             PT Long Term Goals - 11/16/20 1922      PT LONG TERM GOAL #1   Title Pt will be independent with advanced HEP for maintenance.    Status On-going      PT LONG TERM GOAL #2   Title Pt will demonstrate pain free R shoulder AROM WFL.    Status On-going      PT LONG TERM GOAL #3   Title Pt will demo 5/5 L shoulder MMT with no pain.    Status On-going      PT LONG TERM GOAL #4   Title Pt will don/doff bra and clothing without pain.    Status On-going      PT LONG TERM GOAL #5   Title Pt will increase FOTO ability to at least 64% ability, in order to demonstrate meaningful change in perceived level of functional ability.     Status On-going                 Plan - 12/14/20 1825    Clinical Impression Statement Patient has made good progress with functional abilities as evidenced by FOTO increase from 34% to 55%, although today patient is a little more irritated than usual as she states she had to hold phone for 2 long calls and didn't realize it was irritating the shoulder until done. This also affected her active elevation ROM in all planes with increased pain. Today it appears there may be some post deltoid atrophy which will be assessed further when patient pain level back down.  Patient would benefit from continued skilled PT to address deficits and maximize functional use of UE for daily activity without pain.    Comorbidities HTN, heart murmur    Examination-Activity Limitations Bathing;Lift;Reach Overhead;Carry    Examination-Participation Restrictions Occupation;Cleaning;Laundry    PT Treatment/Interventions ADLs/Self Care Home Management;Cryotherapy;Electrical Stimulation;Iontophoresis 4mg /ml Dexamethasone;Moist Heat;Therapeutic activities;Therapeutic exercise;Neuromuscular re-education;Patient/family education;Manual techniques;Passive range of motion;Dry needling;Taping;Joint Manipulations;Spinal Manipulations;Vasopneumatic Device    PT Next Visit Plan Continue to assess response to new exercises and modifications suggested at previous visit.  Manual as indicated for R shoulder joint mobility and soft tissue restrictions, as well as strengthening for [place] stability with elevation. Assess post deltoid strength.    PT Home Exercise Plan [id number]     Consulted and Agree with Plan of Care Patient           Patient will benefit from skilled therapeutic intervention in order to improve the following deficits and impairments:  Pain,Postural dysfunction,Impaired flexibility,Decreased strength,Improper body mechanics,Impaired perceived functional ability,Decreased range of motion  Visit Diagnosis: Acute pain of  right shoulder  Abnormal posture  Muscle weakness (generalized)  Stiffness of right shoulder, not elsewhere classified     Problem List Patient Active Problem List   Diagnosis Date Noted  . Type 2 diabetes mellitus with hyperglycemia, without long-term current use of insulin ([place]) 12/09/2020  . Sleep apnea 02/05/2019  .  Class 1 obesity due to excess calories with serious comorbidity and body mass index (BMI) of 34.0 to 34.9 in adult 02/05/2019  . Dyslipidemia associated with type 2 diabetes mellitus ([place]) 06/09/2018  . Hypertriglyceridemia 06/09/2018  . Hypertension associated with diabetes ([place]) 08/18/2016  . Iron deficiency anemia due to chronic blood loss 08/18/2016  . Fibroids 05/08/2012  . Menorrhagia 04/15/2012    Pollyann Samples, PT 12/14/2020, 6:32 PM  [facility] [address] [place], [place], [address] Phone: [phone]   Fax:  [phone]  Name: Christina Schwartz MRN: [id number] Date of Birth: Nov 04, 1970

## 2020-12-19 MED FILL — Atenolol Tab 50 MG: ORAL | 90 days supply | Qty: 90 | Fill #0 | Status: AC

## 2020-12-20 ENCOUNTER — Other Ambulatory Visit (HOSPITAL_COMMUNITY): Payer: Self-pay

## 2020-12-21 ENCOUNTER — Ambulatory Visit: Payer: 59 | Admitting: Physical Therapy

## 2020-12-21 ENCOUNTER — Encounter: Payer: Self-pay | Admitting: Physical Therapy

## 2020-12-21 ENCOUNTER — Other Ambulatory Visit: Payer: Self-pay

## 2020-12-21 DIAGNOSIS — R293 Abnormal posture: Secondary | ICD-10-CM

## 2020-12-21 DIAGNOSIS — M25511 Pain in right shoulder: Secondary | ICD-10-CM

## 2020-12-21 DIAGNOSIS — M25611 Stiffness of right shoulder, not elsewhere classified: Secondary | ICD-10-CM | POA: Diagnosis not present

## 2020-12-21 DIAGNOSIS — M6281 Muscle weakness (generalized): Secondary | ICD-10-CM | POA: Diagnosis not present

## 2020-12-21 NOTE — Therapy (Signed)
[place], [place], [address] Phone: [phone]   Fax:  [phone]  Physical Therapy Treatment  Patient Details  Name: Christina Schwartz MRN: [id number] Date of Birth: [birth year] Referring Provider (PT): Erskine Emery, PA-C   Encounter Date: 12/21/2020   PT End of Session - 12/21/20 1827     Visit Number 7    Number of Visits 12    Date for PT Re-Evaluation 01/04/21    Authorization Type [employer]    Progress Note Due on Visit 10    PT Start Time 1750    PT Stop Time 1828    PT Time Calculation (min) 38 min    Activity Tolerance Patient tolerated treatment well;No increased pain    Behavior During Therapy WFL for tasks assessed/performed             Past Medical History:  Diagnosis Date   Anemia    Heart murmur    Hypertension     Past Surgical History:  Procedure Laterality Date   TUBAL LIGATION     WISDOM TOOTH EXTRACTION      There were no vitals filed for this visit.   Subjective Assessment - 12/21/20 1752     Subjective Patient reports good compliance with HEP. Patient states she's doing "pretty good".    Limitations House hold activities    Diagnostic tests XR R shoulder: negative for fracture or any bony abnormalities    Patient Stated Goals Relief, being normal again    Pain Score 0-No pain    Pain Location Shoulder                [facility] PT Assessment - 12/21/20 0001       Assessment   Medical Diagnosis Right Shoulder Impingement    Referring Provider (PT) Erskine Emery, PA-C                           [facility] Adult PT Treatment/Exercise - 12/21/20 0001       Shoulder Exercises: Supine   Horizontal ABduction 10 reps   with 10 sec hold before and after   Horizontal ABduction Limitations Laying on Grey soft block along    Flexion 10 reps    Flexion Limitations Laying on Grey soft block along      Shoulder Exercises: Prone   Flexion 10  reps    Flexion Weight (lbs) 3    Extension 10 reps    Extension Weight (lbs) 4    Horizontal ABduction 1 10 reps    Horizontal ABduction 1 Weight (lbs) 0      Shoulder Exercises: Sidelying   Other Sidelying Exercises Prone row 4#DB x10      Shoulder Exercises: Standing   Extension 10 reps;Both    Theraband Level (Shoulder Extension) Level 3 (Green)    Extension Limitations 2 sets    Row 10 reps    Theraband Level (Shoulder Row) Level 3 (Green)    Row Limitations 2sets      Shoulder Exercises: ROM/Strengthening   UBE (Upper Arm Bike) L2 x49min, 76fwd/2bk      Shoulder Exercises: Stretch   Corner Stretch 20 seconds    Corner Stretch Limitations x3 different hand positions      Manual Therapy   Manual Therapy Joint mobilization;Soft tissue mobilization    Joint Mobilization humerus on glenoid PA and inferior glides, scap upward rotation    Soft tissue mobilization  R Ant and lat GH region, UT, mid scap and periscap.                    PT Education - 12/21/20 1828     Education Details Continue HEP, be sure to do chest stretches daily.    Person(s) Educated Patient    Methods Explanation;Demonstration    Comprehension Verbalized understanding              PT Short Term Goals - 12/14/20 1829       PT SHORT TERM GOAL #1   Title Pt will be I and compliant with initial HEP.    Baseline provided at initial eval    Period Weeks    Status Achieved               PT Long Term Goals - 11/16/20 1922       PT LONG TERM GOAL #1   Title Pt will be independent with advanced HEP for maintenance.    Status On-going      PT LONG TERM GOAL #2   Title Pt will demonstrate pain free R shoulder AROM WFL.    Status On-going      PT LONG TERM GOAL #3   Title Pt will demo 5/5 L shoulder MMT with no pain.    Status On-going      PT LONG TERM GOAL #4   Title Pt will don/doff bra and clothing without pain.    Status On-going      PT LONG TERM GOAL #5   Title Pt  will increase FOTO ability to at least 64% ability, in order to demonstrate meaningful change in perceived level of functional ability.    Status On-going                   Plan - 12/21/20 1810     Clinical Impression Statement Patient with tight chest soft tissue, given chest stretch for HEP.  Patient continues with sudden pain with in some positions of overhead reaching.  Patient will benefit from continued skilled PT to address deficits and maximize functional use of R UE.    Comorbidities HTN, heart murmur    Examination-Activity Limitations Bathing;Lift;Reach Overhead;Carry    Examination-Participation Restrictions Occupation;Cleaning;Laundry    PT Treatment/Interventions ADLs/Self Care Home Management;Cryotherapy;Electrical Stimulation;Iontophoresis 4mg /ml Dexamethasone;Moist Heat;Therapeutic activities;Therapeutic exercise;Neuromuscular re-education;Patient/family education;Manual techniques;Passive range of motion;Dry needling;Taping;Joint Manipulations;Spinal Manipulations;Vasopneumatic Device    PT Next Visit Plan Continue to assess response to new exercises and modifications suggested at previous visit.  Manual as indicated for R shoulder joint mobility and soft tissue restrictions, as well as strengthening for [place] stability with elevation. Assess post deltoid strength.    PT Home Exercise Plan [id number]     Consulted and Agree with Plan of Care Patient             Patient will benefit from skilled therapeutic intervention in order to improve the following deficits and impairments:  Pain, Postural dysfunction, Impaired flexibility, Decreased strength, Improper body mechanics, Impaired perceived functional ability, Decreased range of motion  Visit Diagnosis: Acute pain of right shoulder  Abnormal posture  Muscle weakness (generalized)  Stiffness of right shoulder, not elsewhere classified     Problem List Patient Active Problem List   Diagnosis Date Noted   Type  2 diabetes mellitus with hyperglycemia, without long-term current use of insulin ([place]) 12/09/2020   Sleep apnea 02/05/2019   Class 1 obesity due to excess calories with serious comorbidity and  body mass index (BMI) of 34.0 to 34.9 in adult 02/05/2019   Dyslipidemia associated with type 2 diabetes mellitus ([place]) 06/09/2018   Hypertriglyceridemia 06/09/2018   Hypertension associated with diabetes ([place]) 08/18/2016   Iron deficiency anemia due to chronic blood loss 08/18/2016   Fibroids 05/08/2012   Menorrhagia 04/15/2012    Pollyann Samples, PT 12/21/2020, 6:30 PM  [facility] [address] [place], [place], [address] Phone: [phone]   Fax:  [phone]  Name: Christina Schwartz MRN: [id number] Date of Birth: [birth year]

## 2020-12-29 ENCOUNTER — Encounter: Payer: Self-pay | Admitting: Physical Therapy

## 2020-12-29 ENCOUNTER — Other Ambulatory Visit: Payer: Self-pay

## 2020-12-29 ENCOUNTER — Ambulatory Visit: Payer: 59 | Admitting: Physical Therapy

## 2020-12-29 DIAGNOSIS — M6281 Muscle weakness (generalized): Secondary | ICD-10-CM | POA: Diagnosis not present

## 2020-12-29 DIAGNOSIS — M25611 Stiffness of right shoulder, not elsewhere classified: Secondary | ICD-10-CM | POA: Diagnosis not present

## 2020-12-29 DIAGNOSIS — M25511 Pain in right shoulder: Secondary | ICD-10-CM | POA: Diagnosis not present

## 2020-12-29 DIAGNOSIS — R293 Abnormal posture: Secondary | ICD-10-CM | POA: Diagnosis not present

## 2020-12-29 NOTE — Therapy (Addendum)
[place], [place], [address] Phone: [phone]   Fax:  [phone]  Physical Therapy Treatment  Patient Details  Name: Christina Schwartz MRN: [id number] Date of Birth: 11/15/1970 Referring Provider (PT): Erskine Emery, PA-C   Encounter Date: 12/29/2020   PT End of Session - 12/29/20 1745     Visit Number 8    Number of Visits 12    Date for PT Re-Evaluation 01/04/21    Authorization Type [employer]    Progress Note Due on Visit 10    PT Start Time 4174    PT Stop Time 1829    PT Time Calculation (min) 44 min    Activity Tolerance Patient tolerated treatment well;No increased pain    Behavior During Therapy WFL for tasks assessed/performed             Past Medical History:  Diagnosis Date   Anemia    Heart murmur    Hypertension     Past Surgical History:  Procedure Laterality Date   TUBAL LIGATION     WISDOM TOOTH EXTRACTION      There were no vitals filed for this visit.   Subjective Assessment - 12/29/20 1751     Subjective Patient reports "it stills feels hard, just pushing through".    Limitations House hold activities    Diagnostic tests XR R shoulder: negative for fracture or any bony abnormalities    Patient Stated Goals Relief, being normal again    Currently in Pain? Yes    Pain Score 1    Max since last visit = 3/10   Pain Location Shoulder    Pain Orientation Right    Pain Descriptors / Indicators Aching                [facility] PT Assessment - 12/29/20 0001       Assessment   Medical Diagnosis Right Shoulder Impingement    Referring Provider (PT) Erskine Emery, PA-C      PROM   Right Shoulder Flexion 126 Degrees    Right Shoulder ABduction 130 Degrees    Right Shoulder External Rotation 75 Degrees      Strength   Overall Strength Comments R post deltoid=3+                           [facility] Adult PT Treatment/Exercise - 12/29/20  0001       Shoulder Exercises: Standing   Internal Rotation 10 reps    Internal Rotation Limitations Ball squeex 3sec hold    Flexion 10 reps    Theraband Level (Shoulder Flexion) Level 1 (Yellow)    ABduction 10 reps    Theraband Level (Shoulder ABduction) Level 1 (Yellow)    ABduction Limitations Additional without resistance in 45deg horiz abd    Extension 10 reps;Both    Theraband Level (Shoulder Extension) Level 3 (Green)    Extension Limitations 2 sets    Row 10 reps    Theraband Level (Shoulder Row) Level 3 (Green)    Row Limitations 2sets    Diagonals Limitations D2 YELLOW 2x10 (low)      Manual Therapy   Manual Therapy Joint mobilization;Soft tissue mobilization    Joint Mobilization humerus on glenoid PA and inferior glides, scap upward rotation    Soft tissue mobilization R Ant and lat GH region, UT, mid scap and periscap.  PT Education - 12/29/20 1823     Education Details Addition to HEP for post delt strengthening.              PT Short Term Goals - 12/29/20 1852       PT SHORT TERM GOAL #2   Title Pt will increase AROM R shoulder by 20 degees in flexion, abduction, and ER with <4/10 pain.    Baseline pain with all ROM; Flex126deg, ABD 130deg, ER 75deg with impingement pain pattern 2/10 to 3/10 (12/29/2020)    Time 3    Period Weeks    Status Achieved               PT Long Term Goals - 11/16/20 1922       PT LONG TERM GOAL #1   Title Pt will be independent with advanced HEP for maintenance.    Status On-going      PT LONG TERM GOAL #2   Title Pt will demonstrate pain free R shoulder AROM WFL.    Status On-going      PT LONG TERM GOAL #3   Title Pt will demo 5/5 L shoulder MMT with no pain.    Status On-going      PT LONG TERM GOAL #4   Title Pt will don/doff bra and clothing without pain.    Status On-going      PT LONG TERM GOAL #5   Title Pt will increase FOTO ability to at least 64% ability, in order  to demonstrate meaningful change in perceived level of functional ability.    Status On-going                   Plan - 12/29/20 1746     Clinical Impression Statement Patient has progressed well with AROM R shoulder flex and abd as well as ER meeting short term goal.  Patient does however demonstrate weak R posterior deltoid strength and limited scapular mobility with active elevation, although scapular mobility is fairly good passively. Pain continues with an impingement pattern in all planes of R elevation flex worse than abduction. Patient will benefit from continued PT to address deficits and maximize return to functional use of R UE with decrased pain during daily acivity.    Comorbidities HTN, heart murmur    Examination-Activity Limitations Bathing;Lift;Reach Overhead;Carry    Examination-Participation Restrictions Occupation;Cleaning;Laundry    PT Treatment/Interventions ADLs/Self Care Home Management;Cryotherapy;Electrical Stimulation;Iontophoresis 4mg /ml Dexamethasone;Moist Heat;Therapeutic activities;Therapeutic exercise;Neuromuscular re-education;Patient/family education;Manual techniques;Passive range of motion;Dry needling;Taping;Joint Manipulations;Spinal Manipulations;Vasopneumatic Device    PT Next Visit Plan Continue to assess response to new exercises.  Manual as indicated for R shoulder joint mobility and soft tissue restrictions, as well as strengthening for [place] stability with elevation. Adjust HEP to add post delt strengthening such as bent over row or fly.    PT Home Exercise Plan [email or web address]     Consulted and Agree with Plan of Care Patient             Patient will benefit from skilled therapeutic intervention in order to improve the following deficits and impairments:  Pain, Postural dysfunction, Impaired flexibility, Decreased strength, Improper body mechanics, Impaired perceived functional ability, Decreased range of motion  Visit Diagnosis: Acute pain of right  shoulder  Abnormal posture  Muscle weakness (generalized)  Stiffness of right shoulder, not elsewhere classified     Problem List Patient Active Problem List   Diagnosis Date Noted   Type 2 diabetes mellitus with hyperglycemia, without long-term current use  of insulin ([place]) 12/09/2020   Sleep apnea 02/05/2019   Class 1 obesity due to excess calories with serious comorbidity and body mass index (BMI) of 34.0 to 34.9 in adult 02/05/2019   Dyslipidemia associated with type 2 diabetes mellitus ([place]) 06/09/2018   Hypertriglyceridemia 06/09/2018   Hypertension associated with diabetes ([place]) 08/18/2016   Iron deficiency anemia due to chronic blood loss 08/18/2016   Fibroids 05/08/2012   Menorrhagia 04/15/2012    Pollyann Samples, PT 12/29/2020, 7:08 PM  [facility] [address] [place], [place], [address] Phone: [phone]   Fax:  [phone]  Name: Christina Schwartz MRN: [id number] Date of Birth: Feb 22, 1971

## 2020-12-29 NOTE — Patient Instructions (Signed)
Access Code: [email or web address]  URL: [email or web address] Date: 12/29/2020 Prepared by: Pollyann Samples  NEW Exercises  Shoulder Abduction - Thumbs Up - 1 x daily - 7 x weekly - 2 sets - 10 reps

## 2020-12-31 ENCOUNTER — Encounter: Payer: 59 | Attending: Nurse Practitioner | Admitting: Registered"

## 2020-12-31 ENCOUNTER — Other Ambulatory Visit: Payer: Self-pay

## 2020-12-31 ENCOUNTER — Encounter: Payer: Self-pay | Admitting: Registered"

## 2020-12-31 DIAGNOSIS — E1165 Type 2 diabetes mellitus with hyperglycemia: Secondary | ICD-10-CM | POA: Insufficient documentation

## 2020-12-31 NOTE — Patient Instructions (Signed)
Goals: Creat balance meal for dinner 3x per week Include more fruit in diet: replace candy at work, use fruit for snacks paired with protein. Exercise: check into [employer], considering Sat, Sun, Mon Check blood sugar 2x times per day, Fasting and 2 hrs after dinner.

## 2020-12-31 NOTE — Progress Notes (Signed)
Diabetes Self-Management Education  Visit Type: First/Initial  Appt. Start Time: 0800 Appt. End Time: 1275  12/31/2020  [name], identified by name and date of birth, is a 50 y.o. female with a diagnosis of Diabetes: Type 2.   ASSESSMENT  There were no vitals taken for this visit. There is no height or weight on file to calculate BMI.   Medication: Metformin 500 bid (started 6/2) reports continued GI side effects so has not increased.Pt reports she takes mid-day because lunch is heaviest meal.  Patient states she is working 2 jobs and in school for medical administration. Pt states she relaxes by watching TV or does school work. Pt states she relies on fast food due to busy schedule. Pt reports she as tried menu planning but gets monotonous.  Pt states she checks blood sugar randomly weekly and has seen as low as 97 in the morning and generally runs in 150s toward the evening.  Physical activity: FT work is sedentary, keeps part-time job 2x/week because it is active. Pt states she has lack of motivation to exercise, used to go to gym and used equipment 5-6 years ago.   Pt states she usually has fast food for lunch, has an hour lunch break, 2-3x/week reps bring in lunch. Pt states she likes french fries, [employer] kids meal with large french fries, 2 chicken strips  (>77 g cho)  Sleep: 6-9 hrs  Stress: 3/10   Diabetes Self-Management Education - 12/31/20 0808       Visit Information   Visit Type First/Initial      Initial Visit   Diabetes Type Type 2    Are you currently following a meal plan? No    Are you taking your medications as prescribed? Yes    Date Diagnosed 2019      Health Coping   How would you rate your overall health? Poor      Psychosocial Assessment   Patient Belief/Attitude about Diabetes Afraid    How often do you need to have someone help you when you read instructions, pamphlets, or other written materials from your doctor or pharmacy? 1 -  Never    What is the last grade level you completed in school? college      Complications   Last HgB A1C per patient/outside source 7 %    How often do you check your blood sugar? 3-4 times / week    Number of hypoglycemic episodes per month 0    Have you had a dilated eye exam in the past 12 months? No    Have you had a dental exam in the past 12 months? Yes    Are you checking your feet? No    How many days per week are you checking your feet? 7      Dietary Intake   Breakfast [employer] cheese and potato (no egg), water    Snack (morning) smoothie spinach, berries, non-fat yogurt (plain)    Lunch beef, cheese, tomatoes, sour cream, chips, water OR [place] sandwich    Snack (afternoon) none - sometimes has candy at desk    [place] and cheese 1 cup or less, green beans, chicken OR fast food    Snack (evening) none    Beverage(s) 58 oz water, occasional sparkling water      Exercise   Exercise Type ADL's      Patient Education   Previous Diabetes Education No    Disease state  Definition of diabetes, type  1 and 2, and the diagnosis of diabetes    Nutrition management  Role of diet in the treatment of diabetes and the relationship between the three main macronutrients and blood glucose level;Carbohydrate counting    Physical activity and exercise  Role of exercise on diabetes management, blood pressure control and cardiac health.    Medications Reviewed patients medication for diabetes, action, purpose, timing of dose and side effects.    Monitoring Identified appropriate SMBG and/or A1C goals.    Chronic complications Assessed and discussed foot care and prevention of foot problems;Retinopathy and reason for yearly dilated eye exams      Individualized Goals (developed by patient)   Nutrition General guidelines for healthy choices and portions discussed    Physical Activity Exercise 3-5 times per week    Monitoring  test my blood glucose as discussed      Outcomes   Expected  Outcomes Demonstrated interest in learning. Expect positive outcomes    Future DMSE 2 months    Program Status Completed             Individualized Plan for Diabetes Self-Management Training:   Learning Objective:  Patient will have a greater understanding of diabetes self-management. Patient education plan is to attend individual and/or group sessions per assessed needs and concerns.    Patient Instructions  Goals: Creat balance meal for dinner 3x per week Include more fruit in diet: replace candy at work, use fruit for snacks paired with protein. Exercise: check into [employer], considering Sat, Sun, Mon Check blood sugar 2x times per day, Fasting and 2 hrs after dinner.  Expected Outcomes:  Demonstrated interest in learning. Expect positive outcomes  Education material provided: A1C conversion sheet, My Plate, and Carbohydrate counting sheet  If problems or questions, patient to contact team via:  Phone and MyChart  Future DSME appointment: 2 months

## 2021-01-04 ENCOUNTER — Other Ambulatory Visit: Payer: Self-pay

## 2021-01-04 ENCOUNTER — Ambulatory Visit: Payer: 59 | Admitting: Physical Therapy

## 2021-01-04 ENCOUNTER — Other Ambulatory Visit (HOSPITAL_COMMUNITY): Payer: Self-pay

## 2021-01-04 DIAGNOSIS — M25611 Stiffness of right shoulder, not elsewhere classified: Secondary | ICD-10-CM | POA: Diagnosis not present

## 2021-01-04 DIAGNOSIS — M25511 Pain in right shoulder: Secondary | ICD-10-CM

## 2021-01-04 DIAGNOSIS — R293 Abnormal posture: Secondary | ICD-10-CM

## 2021-01-04 DIAGNOSIS — M6281 Muscle weakness (generalized): Secondary | ICD-10-CM | POA: Diagnosis not present

## 2021-01-04 NOTE — Therapy (Signed)
[place], [place], [address] Phone: [phone]   Fax:  [phone]  Physical Therapy Treatment/Progress Note Progress Note Reporting Period 01/04/2021 to 02/01/2021  See note below for Objective Data and Assessment of Progress/Goals.      Patient Details  Name: Christina Schwartz MRN: [id number] Date of Birth: April 23, 1971 Referring Provider (PT): Erskine Emery, PA-C   Encounter Date: 01/04/2021   PT End of Session - 01/04/21 1750     Visit Number 9    Number of Visits 12    Date for PT Re-Evaluation 01/04/21    Authorization Type [employer]    PT Start Time 1750    PT Stop Time 6144    PT Time Calculation (min) 40 min    Activity Tolerance Patient tolerated treatment well;No increased pain    Behavior During Therapy WFL for tasks assessed/performed             Past Medical History:  Diagnosis Date   Anemia    Heart murmur    Hypertension     Past Surgical History:  Procedure Laterality Date   TUBAL LIGATION     WISDOM TOOTH EXTRACTION      There were no vitals filed for this visit.   Subjective Assessment - 01/04/21 1830     Subjective Patient reports it continues to get easier to do work tasks without pain.    Limitations House hold activities    Patient Stated Goals Relief, being normal again    Currently in Pain? Yes    Pain Score 0-No pain   Max pain around 3-4 with reaching up particularly with weight.   Pain Location Shoulder    Pain Orientation Right                [facility] PT Assessment - 01/04/21 0001       Assessment   Medical Diagnosis Right Shoulder Impingement    Referring Provider (PT) Erskine Emery, PA-C      Observation/Other Assessments   Focus on Therapeutic Outcomes (FOTO)  55% (64% predicted      Sensation   Light Touch Appears Intact      AROM   Right Shoulder Flexion 140 Degrees    Right Shoulder ABduction 110 Degrees    Right Shoulder  External Rotation 75 Degrees      PROM   Overall PROM Comments Mild pain in impingement patter that decreases at end range    Right Shoulder Flexion 150 Degrees   No pain   Right Shoulder ABduction 140 Degrees   no pain   Right Shoulder External Rotation 75 Degrees      Strength   Overall Strength Comments R post deltoid=3+    Right Shoulder Flexion 4-/5    Right Shoulder ABduction 4-/5    Right Shoulder Internal Rotation 4+/5    Right Shoulder External Rotation 4/5   mild pain                          [facility] Adult PT Treatment/Exercise - 01/04/21 0001       Shoulder Exercises: ROM/Strengthening   Ball on Wall Up/Down, R/L 2x30sec ea    Other ROM/Strengthening Exercises Bent over fly for post delt 1#x10, 2#x10, 3#x2      Shoulder Exercises: Stretch   Corner Stretch 20 seconds;2 reps      Shoulder Exercises: Body Blade   External Rotation 30 seconds;2 reps  Other Body Blade Exercises elbow flex/ext x30sec      Manual Therapy   Manual Therapy Joint mobilization;Soft tissue mobilization    Joint Mobilization humerus on glenoid PA and inferior glides, scap upward rotation    Soft tissue mobilization R Ant and lat GH region, UT, mid scap and periscap.                    PT Education - 01/04/21 1812     Education Details Discussed POC and that with progress and continued defictits will certify for some additional therapy.  Addition of bent over fly to HEP to improve post deltoid strength.    Person(s) Educated Patient    Methods Explanation;Demonstration    Comprehension Verbalized understanding              PT Short Term Goals - 12/29/20 1852       PT SHORT TERM GOAL #2   Title Pt will increase AROM R shoulder by 20 degees in flexion, abduction, and ER with <4/10 pain.    Baseline pain with all ROM; Flex126deg, ABD 130deg, ER 75deg with impingement pain pattern 2/10 to 3/10 (12/29/2020)    Time 3    Period Weeks    Status Achieved                PT Long Term Goals - 01/04/21 1753       PT LONG TERM GOAL #1   Title Pt will be independent with advanced HEP for maintenance.    Time 12    Period Weeks    Target Date 02/01/21      PT LONG TERM GOAL #2   Title Pt will demonstrate pain free R shoulder AROM WFL.    Baseline see flowsheet    Time 12    Period Weeks    Status On-going    Target Date 02/01/21      PT LONG TERM GOAL #3   Title Pt will demo 5/5 L shoulder MMT with no pain.    Baseline see flowsheet    Time 12    Period Weeks    Status On-going    Target Date 02/01/21      PT LONG TERM GOAL #4   Title Pt will don/doff bra and clothing without pain.    Baseline pain    Time 8    Period Weeks    Status Achieved      PT LONG TERM GOAL #5   Title Pt will increase FOTO ability to at least 64% ability, in order to demonstrate meaningful change in perceived level of functional ability.    Baseline 34% ability; 55 on 12/14/2020    Time 12    Period Weeks    Status On-going    Target Date 02/01/21                   Plan - 01/04/21 1835     Clinical Impression Statement Patient has made excellent progress meeting all STGs, 1 LTG and partially met the other LTGs.  Patient R AROM has progressed to within 10 deg of other UE, but does continue to demonstrate a impingement pain pattern that does not happen with PROM.  Patient resisted strength continues to be challenged by pain and weakness in all planes of elevation with ABD worse than FLEX.  Patient would benefit from continued skilled PT to address deficits for an additional 4 weeks to maximize functional use of R UE  with decrased pain.    Comorbidities HTN, heart murmur    Examination-Activity Limitations Bathing;Lift;Reach Overhead;Carry    Examination-Participation Restrictions Occupation;Cleaning;Laundry    Rehab Potential Good    PT Frequency 2x / week    PT Duration 4 weeks   Recert for 4 weeks   PT Treatment/Interventions ADLs/Self  Care Home Management;Cryotherapy;Electrical Stimulation;Iontophoresis 75m/ml Dexamethasone;Moist Heat;Therapeutic activities;Therapeutic exercise;Neuromuscular re-education;Patient/family education;Manual techniques;Passive range of motion;Dry needling;Taping;Joint Manipulations;Spinal Manipulations;Vasopneumatic Device    PT Next Visit Plan Continue to assess response to new exercises.  Manual as indicated for R shoulder joint mobility and soft tissue restrictions, as well as strengthening for [place] with elevation.    PT Home Exercise Plan [email or web address]    Consulted and Agree with Plan of Care Patient             Patient will benefit from skilled therapeutic intervention in order to improve the following deficits and impairments:  Pain, Postural dysfunction, Impaired flexibility, Decreased strength, Improper body mechanics, Impaired perceived functional ability, Decreased range of motion  Visit Diagnosis: Acute pain of right shoulder  Abnormal posture  Muscle weakness (generalized)  Stiffness of right shoulder, not elsewhere classified     Problem List Patient Active Problem List   Diagnosis Date Noted   Type 2 diabetes mellitus with hyperglycemia, without long-term current use of insulin ([place] 12/09/2020   Sleep apnea 02/05/2019   Class 1 obesity due to excess calories with serious comorbidity and body mass index (BMI) of 34.0 to 34.9 in adult 02/05/2019   Dyslipidemia associated with type 2 diabetes mellitus ([place] 06/09/2018   Hypertriglyceridemia 06/09/2018   Hypertension associated with diabetes ([place] 08/18/2016   Iron deficiency anemia due to chronic blood loss 08/18/2016   Fibroids 05/08/2012   Menorrhagia 04/15/2012    DPollyann Samples PT 01/04/2021, 7:45 PM  [facility] [address]Phone: [phone]  Fax:  [phone] Name: Christina GasawayMRN: [id number] of Birth:  7December 15, 1972

## 2021-01-04 NOTE — Patient Instructions (Signed)
Access Code: [email or web address]  URL: [email or web address] Date: 01/04/2021 Prepared by: Pollyann Samples  Exercises  Single Arm Bent Over Fly - 1 x daily - 7 x weekly - 2 sets - 10 reps

## 2021-01-11 ENCOUNTER — Ambulatory Visit: Payer: 59 | Attending: Physician Assistant | Admitting: Physical Therapy

## 2021-01-11 ENCOUNTER — Other Ambulatory Visit: Payer: Self-pay

## 2021-01-11 ENCOUNTER — Encounter: Payer: Self-pay | Admitting: Physical Therapy

## 2021-01-11 DIAGNOSIS — R293 Abnormal posture: Secondary | ICD-10-CM | POA: Diagnosis not present

## 2021-01-11 DIAGNOSIS — M6281 Muscle weakness (generalized): Secondary | ICD-10-CM | POA: Insufficient documentation

## 2021-01-11 DIAGNOSIS — M25611 Stiffness of right shoulder, not elsewhere classified: Secondary | ICD-10-CM | POA: Diagnosis not present

## 2021-01-11 DIAGNOSIS — M25511 Pain in right shoulder: Secondary | ICD-10-CM | POA: Insufficient documentation

## 2021-01-11 NOTE — Therapy (Signed)
[place], [place], [address] Phone: [phone]   Fax:  [phone]  Physical Therapy Treatment  Patient Details  Name: Chantelle Verdi MRN: [id number] Date of Birth: 1970/07/15 Referring Provider (PT): Erskine Emery, PA-C   Encounter Date: 01/11/2021   PT End of Session - 01/11/21 1747     Visit Number 10    Number of Visits 16    Date for PT Re-Evaluation 02/01/21    Authorization Type [employer] Employee    Progress Note Due on Visit 10    PT Start Time 7262    PT Stop Time 1827    PT Time Calculation (min) 40 min    Activity Tolerance Patient tolerated treatment well;No increased pain    Behavior During Therapy WFL for tasks assessed/performed             Past Medical History:  Diagnosis Date   Anemia    Heart murmur    Hypertension     Past Surgical History:  Procedure Laterality Date   TUBAL LIGATION     WISDOM TOOTH EXTRACTION      There were no vitals filed for this visit.   Subjective Assessment - 01/11/21 1752     Subjective Patient reports she's feeling pretty good, some of the HEP is still challenging.  I cleaned my front door and it wasn't as hard.    Limitations House hold activities    Patient Stated Goals Relief, being normal again    Currently in Pain? Yes    Pain Score 0-No pain   Max = 1/10   Pain Orientation Right    Pain Descriptors / Indicators Aching    Pain Type Chronic pain    Pain Radiating Towards NONE                [facility] PT Assessment - 01/11/21 0001       Assessment   Medical Diagnosis Right Shoulder Impingement    Referring Provider (PT) Erskine Emery, PA-C    Hand Dominance Right    Next MD Visit come back if PT was not working      Precautions   Precautions None                           [facility] Adult PT Treatment/Exercise - 01/11/21 0001       Shoulder Exercises: Supine   External Rotation 10 reps    Theraband  Level (Shoulder External Rotation) Level 1 (Yellow)    External Rotation Limitations B 2x10    Internal Rotation 15 reps    Internal Rotation Limitations Ball squeeze    Flexion 10 reps    Shoulder Flexion Weight (lbs) Dowel to 45deg, challenging    Other Supine Exercises Chest press 4# on dowel 2x10      Shoulder Exercises: Standing   Theraband Level (Shoulder Extension) Level 3 (Green)    Extension Limitations 2 sets    Row 10 reps    Theraband Level (Shoulder Row) Level 3 (Green)    Row Limitations 2sets    Diagonals Limitations D2 no band, can go higher    Other Standing Exercises B ball roll out on table      Shoulder Exercises: Pulleys   Flexion 1 minute    Flexion Limitations Trial for ROM (good)    Scaption 1 minute      Shoulder Exercises: Stretch   Corner Stretch 20 seconds;2  reps    Warehouse manager Limitations Doorway "w"      Shoulder Exercises: Body Blade   External Rotation 30 seconds;2 reps    Other Body Blade Exercises elbow flex/ext x30sec      Manual Therapy   Manual Therapy Joint mobilization;Soft tissue mobilization    Joint Mobilization humerus on glenoid PA and inferior glides, scap upward rotation    Soft tissue mobilization R Ant and lat GH region, UT, mid scap and periscap.                    PT Education - 01/11/21 1838     Education Details Patient to continue with current HEP.    Person(s) Educated Patient    Methods Explanation;Demonstration    Comprehension Verbalized understanding;Returned demonstration              PT Short Term Goals - 12/29/20 1852       PT SHORT TERM GOAL #2   Title Pt will increase AROM R shoulder by 20 degees in flexion, abduction, and ER with <4/10 pain.    Baseline pain with all ROM; Flex126deg, ABD 130deg, ER 75deg with impingement pain pattern 2/10 to 3/10 (12/29/2020)    Time 3    Period Weeks    Status Achieved               PT Long Term Goals - 01/04/21 1753       PT LONG TERM  GOAL #1   Title Pt will be independent with advanced HEP for maintenance.    Time 12    Period Weeks    Target Date 02/01/21      PT LONG TERM GOAL #2   Title Pt will demonstrate pain free R shoulder AROM WFL.    Baseline see flowsheet    Time 12    Period Weeks    Status On-going    Target Date 02/01/21      PT LONG TERM GOAL #3   Title Pt will demo 5/5 L shoulder MMT with no pain.    Baseline see flowsheet    Time 12    Period Weeks    Status On-going    Target Date 02/01/21      PT LONG TERM GOAL #4   Title Pt will don/doff bra and clothing without pain.    Baseline pain    Time 8    Period Weeks    Status Achieved      PT LONG TERM GOAL #5   Title Pt will increase FOTO ability to at least 64% ability, in order to demonstrate meaningful change in perceived level of functional ability.    Baseline 34% ability; 55 on 12/14/2020    Time 12    Period Weeks    Status On-going    Target Date 02/01/21                   Plan - 01/11/21 1840     Clinical Impression Statement Patient continues to make progress with R shoulder ROM and able to perform pulleys as a trial for almost full ROM with passive/AA; however, continued to be quite challenged by AROM in all planes of elevation.  Patient will benefit from continued skilled PT to address deficits and maximize functional use of R UE with focus on strength and stability.    Personal Factors and Comorbidities Comorbidity 2;Past/Current Experience;Time since onset of injury/illness/exacerbation;Fitness    Comorbidities HTN, heart murmur  Examination-Activity Limitations Bathing;Lift;Reach Overhead;Carry    PT Treatment/Interventions ADLs/Self Care Home Management;Cryotherapy;Electrical Stimulation;Iontophoresis 4mg /ml Dexamethasone;Moist Heat;Therapeutic activities;Therapeutic exercise;Neuromuscular re-education;Patient/family education;Manual techniques;Passive range of motion;Dry needling;Taping;Joint  Manipulations;Spinal Manipulations;Vasopneumatic Device    PT Next Visit Plan Continue to assess response to new exercises.  Manual less important now (good passive range), focus on strengthening for [facility] stability with elevation without impingement pain..    PT Home Exercise Plan [email or web address]     Consulted and Agree with Plan of Care Patient             Patient will benefit from skilled therapeutic intervention in order to improve the following deficits and impairments:  Pain, Postural dysfunction, Impaired flexibility, Decreased strength, Improper body mechanics, Impaired perceived functional ability, Decreased range of motion  Visit Diagnosis: Acute pain of right shoulder  Abnormal posture  Muscle weakness (generalized)  Stiffness of right shoulder, not elsewhere classified     Problem List Patient Active Problem List   Diagnosis Date Noted   Type 2 diabetes mellitus with hyperglycemia, without long-term current use of insulin ([place]) 12/09/2020   Sleep apnea 02/05/2019   Class 1 obesity due to excess calories with serious comorbidity and body mass index (BMI) of 34.0 to 34.9 in adult 02/05/2019   Dyslipidemia associated with type 2 diabetes mellitus ([place]) 06/09/2018   Hypertriglyceridemia 06/09/2018   Hypertension associated with diabetes ([place]) 08/18/2016   Iron deficiency anemia due to chronic blood loss 08/18/2016   Fibroids 05/08/2012   Menorrhagia 04/15/2012    Pollyann Samples, PT 01/11/2021, 6:49 PM  [facility] [address] [place], [place], [address] Phone: [phone]   Fax:  [phone]  Name: Sheilla Maris MRN: [id number] Date of Birth: 08/16/1970

## 2021-01-22 ENCOUNTER — Encounter: Payer: Self-pay | Admitting: Physical Therapy

## 2021-01-22 ENCOUNTER — Ambulatory Visit: Payer: 59 | Admitting: Physical Therapy

## 2021-01-22 ENCOUNTER — Ambulatory Visit: Payer: 59

## 2021-01-22 ENCOUNTER — Other Ambulatory Visit: Payer: Self-pay

## 2021-01-22 DIAGNOSIS — M25511 Pain in right shoulder: Secondary | ICD-10-CM | POA: Diagnosis not present

## 2021-01-22 DIAGNOSIS — M25611 Stiffness of right shoulder, not elsewhere classified: Secondary | ICD-10-CM | POA: Diagnosis not present

## 2021-01-22 DIAGNOSIS — R293 Abnormal posture: Secondary | ICD-10-CM | POA: Diagnosis not present

## 2021-01-22 DIAGNOSIS — M6281 Muscle weakness (generalized): Secondary | ICD-10-CM | POA: Diagnosis not present

## 2021-01-22 NOTE — Therapy (Signed)
[place], [place], [address] Phone: [phone]   Fax:  [phone]  Physical Therapy Treatment  Patient Details  Name: Christina Schwartz MRN: [id number] Date of Birth: [birth year] Referring Provider (PT): Erskine Emery, PA-C   Encounter Date: 01/22/2021   PT End of Session - 01/22/21 1052     Visit Number 11    Number of Visits 16    Date for PT Re-Evaluation 02/01/21    Authorization Type [employer]    PT Start Time [phone]    PT Stop Time 1028    PT Time Calculation (min) 40 min    Activity Tolerance Patient tolerated treatment well    Behavior During Therapy [facility] for tasks assessed/performed             Past Medical History:  Diagnosis Date   Anemia    Heart murmur    Hypertension     Past Surgical History:  Procedure Laterality Date   TUBAL LIGATION     WISDOM TOOTH EXTRACTION      There were no vitals filed for this visit.   Subjective Assessment - 01/22/21 1051     Subjective No pain pre-tx. this AM. Pt. reports last symptoms for shoulder were 2 days ago at work. Still having intermittent symptoms with reaching activities.    Currently in Pain? No/denies                               [facility] Adult PT Treatment/Exercise - 01/22/21 0001       Shoulder Exercises: Supine   Protraction AROM;Right;20 reps    Protraction Limitations serratus punch    External Rotation AROM;Strengthening;20 reps    Theraband Level (Shoulder External Rotation) Level 1 (Yellow)    External Rotation Limitations B 2x10    Internal Rotation AROM;Strengthening;Right;20 reps    Theraband Level (Shoulder Internal Rotation) Level 1 (Yellow)    Flexion AROM;Right;20 reps    Flexion Limitations --    Other Supine Exercises Chest press 4# on dowel 2x10      Shoulder Exercises: Sidelying   External Rotation AROM;Strengthening;Right;20 reps    External Rotation Weight (lbs) 1     ABduction Limitations right shoulder scaption AROM to 90 deg 2x10      Shoulder Exercises: Standing   Flexion Limitations AAROM ball roll out with 65 cm ball on high table x 20 reps    Extension AROM;Strengthening;10 reps    Theraband Level (Shoulder Extension) Level 3 (Green)    Extension Limitations 2 sets    Row 10 reps;AROM;Strengthening;Both    Theraband Level (Shoulder Row) Level 3 (Green)    Row Limitations 2sets      Shoulder Exercises: Pulleys   Flexion 1 minute    Scaption 1 minute      Manual Therapy   Joint Mobilization grade III right GH AP mobilization    Soft tissue mobilization STM/trigger point release right infraspinatus. supraspinatus, teres minor                    PT Education - 01/22/21 1052     Education Details symptom etiology, HEP    Person(s) Educated Patient    Methods Explanation    Comprehension Verbalized understanding              PT Short Term Goals - 12/29/20 1852       PT SHORT TERM GOAL #  2   Title Pt will increase AROM R shoulder by 20 degees in flexion, abduction, and ER with <4/10 pain.    Baseline pain with all ROM; Flex126deg, ABD 130deg, ER 75deg with impingement pain pattern 2/10 to 3/10 (12/29/2020)    Time 3    Period Weeks    Status Achieved               PT Long Term Goals - 01/04/21 1753       PT LONG TERM GOAL #1   Title Pt will be independent with advanced HEP for maintenance.    Time 12    Period Weeks    Target Date 02/01/21      PT LONG TERM GOAL #2   Title Pt will demonstrate pain free R shoulder AROM WFL.    Baseline see flowsheet    Time 12    Period Weeks    Status On-going    Target Date 02/01/21      PT LONG TERM GOAL #3   Title Pt will demo 5/5 L shoulder MMT with no pain.    Baseline see flowsheet    Time 12    Period Weeks    Status On-going    Target Date 02/01/21      PT LONG TERM GOAL #4   Title Pt will don/doff bra and clothing without pain.    Baseline pain    Time  8    Period Weeks    Status Achieved      PT LONG TERM GOAL #5   Title Pt will increase FOTO ability to at least 64% ability, in order to demonstrate meaningful change in perceived level of functional ability.    Baseline 34% ability; 55 on 12/14/2020    Time 12    Period Weeks    Status On-going    Target Date 02/01/21                   Plan - 01/22/21 1053     Clinical Impression Statement Pt. still challenged with reaching into overhead motions in abduction and flexion but making progress from baseline status with decreased pain, gradual improvements with reaching ability with RUE. Pt. was able to tolerate flexion AROM overhead in supine but still more challenged with motion in standing. Focused session on exercise performance/progression but did still include brief manual therapy due to concordant pain noted with rotator cuff/periscapular muscle palpation but plan continued shift to more exercise focus with future sessions. Pt. would benefit from continued PT for further progress to help relieve pain and address associated functional limitations for reaching ability with RUE.    Personal Factors and Comorbidities Comorbidity 2;Past/Current Experience;Time since onset of injury/illness/exacerbation;Fitness    Comorbidities HTN, heart murmur    Examination-Activity Limitations Bathing;Lift;Reach Overhead;Carry    Examination-Participation Restrictions Occupation;Cleaning;Laundry    Stability/Clinical Decision Making Stable/Uncomplicated    Clinical Decision Making Low    Rehab Potential Good    PT Frequency 2x / week    PT Duration 4 weeks    PT Treatment/Interventions ADLs/Self Care Home Management;Cryotherapy;Electrical Stimulation;Iontophoresis 4mg /ml Dexamethasone;Moist Heat;Therapeutic activities;Therapeutic exercise;Neuromuscular re-education;Patient/family education;Manual techniques;Passive range of motion;Dry needling;Taping;Joint Manipulations;Spinal  Manipulations;Vasopneumatic Device    PT Next Visit Plan Continue to assess response to new exercises.  Manual less important now (good passive range), focus on strengthening for [facility] stability with elevation without impingement pain..    PT Home Exercise Plan [email or web address]     Consulted and Agree with Plan of Care  Patient             Patient will benefit from skilled therapeutic intervention in order to improve the following deficits and impairments:  Pain, Postural dysfunction, Impaired flexibility, Decreased strength, Improper body mechanics, Impaired perceived functional ability, Decreased range of motion  Visit Diagnosis: Acute pain of right shoulder  Abnormal posture  Muscle weakness (generalized)  Stiffness of right shoulder, not elsewhere classified     Problem List Patient Active Problem List   Diagnosis Date Noted   Type 2 diabetes mellitus with hyperglycemia, without long-term current use of insulin ([place]) 12/09/2020   Sleep apnea 02/05/2019   Class 1 obesity due to excess calories with serious comorbidity and body mass index (BMI) of 34.0 to 34.9 in adult 02/05/2019   Dyslipidemia associated with type 2 diabetes mellitus ([place]) 06/09/2018   Hypertriglyceridemia 06/09/2018   Hypertension associated with diabetes ([place]) 08/18/2016   Iron deficiency anemia due to chronic blood loss 08/18/2016   Fibroids 05/08/2012   Menorrhagia 04/15/2012    Beaulah Dinning, PT, DPT 01/22/21 11:02 AM   [facility] [address] [place], [place], [address] Phone: [phone]   Fax:  [phone]  Name: Christina Schwartz MRN: [id number] Date of Birth: July 10, 1971

## 2021-01-25 ENCOUNTER — Other Ambulatory Visit: Payer: Self-pay

## 2021-01-25 ENCOUNTER — Ambulatory Visit: Payer: 59 | Admitting: Physical Therapy

## 2021-01-25 ENCOUNTER — Encounter: Payer: Self-pay | Admitting: Physical Therapy

## 2021-01-25 DIAGNOSIS — M25511 Pain in right shoulder: Secondary | ICD-10-CM

## 2021-01-25 DIAGNOSIS — M25611 Stiffness of right shoulder, not elsewhere classified: Secondary | ICD-10-CM

## 2021-01-25 DIAGNOSIS — M6281 Muscle weakness (generalized): Secondary | ICD-10-CM

## 2021-01-25 DIAGNOSIS — R293 Abnormal posture: Secondary | ICD-10-CM

## 2021-01-25 NOTE — Therapy (Signed)
[place], [place], [address] Phone: [phone]   Fax:  [phone]  Physical Therapy Treatment  Patient Details  Name: Christina Schwartz MRN: [id number] Date of Birth: [birth year] Referring Provider (PT): Erskine Emery, PA-C   Encounter Date: 01/25/2021   PT End of Session - 01/25/21 1737     Visit Number 12    Number of Visits 16    Date for PT Re-Evaluation 02/01/21    Authorization Type [employer]    Progress Note Due on Visit 20    PT Start Time 1738    PT Stop Time 1810    PT Time Calculation (min) 32 min    Activity Tolerance Patient tolerated treatment well    Behavior During Therapy [facility] for tasks assessed/performed             Past Medical History:  Diagnosis Date   Anemia    Heart murmur    Hypertension     Past Surgical History:  Procedure Laterality Date   TUBAL LIGATION     WISDOM TOOTH EXTRACTION      There were no vitals filed for this visit.   Subjective Assessment - 01/25/21 1739     Subjective Patient is reporting improvement at work. Was sore after last visit.    Currently in Pain? No/denies   Was sore after last visit.               [facility] PT Assessment - 01/25/21 0001       Assessment   Medical Diagnosis Right Shoulder Impingement    Referring Provider (PT) Erskine Emery, PA-C    Next MD Visit come back if PT was not working      Precautions   Precautions None                           [facility] Adult PT Treatment/Exercise - 01/25/21 0001       Shoulder Exercises: Supine   Protraction Limitations serratus punch   6#   Horizontal ABduction Limitations Laying on Grey soft block along    External Rotation 10 reps    Theraband Level (Shoulder External Rotation) Level 2 (Red)    Internal Rotation 10 reps    Theraband Level (Shoulder Internal Rotation) Level 2 (Red)    Flexion Limitations Laying on Grey soft block along    Other  Supine Exercises Chest press 4#DB with B UEs on one DB.      Shoulder Exercises: Body Blade   Flexion 15 seconds    ABduction 15 seconds    External Rotation 30 seconds;2 reps    Other Body Blade Exercises elbow flex/ext x30sec                    PT Education - 01/25/21 1743     Education Details Reviewed HEP.              PT Short Term Goals - 12/29/20 1852       PT SHORT TERM GOAL #2   Title Pt will increase AROM R shoulder by 20 degees in flexion, abduction, and ER with <4/10 pain.    Baseline pain with all ROM; Flex126deg, ABD 130deg, ER 75deg with impingement pain pattern 2/10 to 3/10 (12/29/2020)    Time 3    Period Weeks    Status Achieved  PT Long Term Goals - 01/04/21 1753       PT LONG TERM GOAL #1   Title Pt will be independent with advanced HEP for maintenance.    Time 12    Period Weeks    Target Date 02/01/21      PT LONG TERM GOAL #2   Title Pt will demonstrate pain free R shoulder AROM WFL.    Baseline see flowsheet    Time 12    Period Weeks    Status On-going    Target Date 02/01/21      PT LONG TERM GOAL #3   Title Pt will demo 5/5 L shoulder MMT with no pain.    Baseline see flowsheet    Time 12    Period Weeks    Status On-going    Target Date 02/01/21      PT LONG TERM GOAL #4   Title Pt will don/doff bra and clothing without pain.    Baseline pain    Time 8    Period Weeks    Status Achieved      PT LONG TERM GOAL #5   Title Pt will increase FOTO ability to at least 64% ability, in order to demonstrate meaningful change in perceived level of functional ability.    Baseline 34% ability; 55 on 12/14/2020    Time 12    Period Weeks    Status On-going    Target Date 02/01/21                   Plan - 01/25/21 1812     Clinical Impression Statement Patient reports fatigue with a little irritation after treatment today, states she will ice it down at home.  Patient continues to have  difficulty with supine flexion using 2lb DB, moderately able with 1lb DB, almost full motion without weight.  Will continued shift to more strengthening and stability exercise focus with future sessions. Patient would benefit from continued PT for further progress to help relieve pain and address associated functional limitations for reaching ability with RUE.    Comorbidities HTN, heart murmur    Examination-Activity Limitations Bathing;Lift;Reach Overhead;Carry    Examination-Participation Restrictions Occupation;Cleaning;Laundry    PT Treatment/Interventions ADLs/Self Care Home Management;Cryotherapy;Electrical Stimulation;Iontophoresis 4mg /ml Dexamethasone;Moist Heat;Therapeutic activities;Therapeutic exercise;Neuromuscular re-education;Patient/family education;Manual techniques;Passive range of motion;Dry needling;Taping;Joint Manipulations;Spinal Manipulations;Vasopneumatic Device    PT Next Visit Plan Continue to assess response to new exercises.  Focus on strengthening for [facility] stability with elevation without impingement pain.    PT Home Exercise Plan [email or web address]     Consulted and Agree with Plan of Care Patient             Patient will benefit from skilled therapeutic intervention in order to improve the following deficits and impairments:  Pain, Postural dysfunction, Impaired flexibility, Decreased strength, Improper body mechanics, Impaired perceived functional ability, Decreased range of motion  Visit Diagnosis: Muscle weakness (generalized)  Stiffness of right shoulder, not elsewhere classified  Abnormal posture  Acute pain of right shoulder     Problem List Patient Active Problem List   Diagnosis Date Noted   Type 2 diabetes mellitus with hyperglycemia, without long-term current use of insulin ([place]) 12/09/2020   Sleep apnea 02/05/2019   Class 1 obesity due to excess calories with serious comorbidity and body mass index (BMI) of 34.0 to 34.9 in adult 02/05/2019    Dyslipidemia associated with type 2 diabetes mellitus ([place]) 06/09/2018   Hypertriglyceridemia 06/09/2018   Hypertension associated with diabetes (  [place]) 08/18/2016   Iron deficiency anemia due to chronic blood loss 08/18/2016   Fibroids 05/08/2012   Menorrhagia 04/15/2012    Pollyann Samples, PT 01/25/2021, 6:17 PM  [facility] [address] [place], [place], [address] Phone: [phone]   Fax:  [phone]  Name: Christina Schwartz MRN: [id number] Date of Birth: 1971-04-22

## 2021-02-01 ENCOUNTER — Ambulatory Visit: Payer: 59 | Admitting: Physical Therapy

## 2021-02-02 ENCOUNTER — Encounter: Payer: Self-pay | Admitting: Physical Therapy

## 2021-02-02 ENCOUNTER — Other Ambulatory Visit: Payer: Self-pay

## 2021-02-02 ENCOUNTER — Ambulatory Visit: Payer: 59 | Admitting: Physical Therapy

## 2021-02-02 DIAGNOSIS — M6281 Muscle weakness (generalized): Secondary | ICD-10-CM | POA: Diagnosis not present

## 2021-02-02 DIAGNOSIS — M25611 Stiffness of right shoulder, not elsewhere classified: Secondary | ICD-10-CM | POA: Diagnosis not present

## 2021-02-02 DIAGNOSIS — M25511 Pain in right shoulder: Secondary | ICD-10-CM | POA: Diagnosis not present

## 2021-02-02 DIAGNOSIS — R293 Abnormal posture: Secondary | ICD-10-CM | POA: Diagnosis not present

## 2021-02-02 NOTE — Therapy (Signed)
[place], [place], [address] Phone: [phone]   Fax:  [phone]  Physical Therapy Treatment/ DISCHARGE SUMMARY  Patient Details  Name: Christina Schwartz MRN: [id number] Date of Birth: 31-Jan-1971 Referring Provider (PT): Erskine Emery, PA-C   Encounter Date: 02/02/2021   PT End of Session - 02/02/21 1534     Visit Number 13    Number of Visits 16    Date for PT Re-Evaluation 03/21/21    Authorization Type [employer]    Progress Note Due on Visit 20    PT Start Time 1534    PT Stop Time 1605    PT Time Calculation (min) 31 min    Activity Tolerance Patient tolerated treatment well    Behavior During Therapy [facility] for tasks assessed/performed             Past Medical History:  Diagnosis Date   Anemia    Heart murmur    Hypertension     Past Surgical History:  Procedure Laterality Date   TUBAL LIGATION     WISDOM TOOTH EXTRACTION      There were no vitals filed for this visit.   Subjective Assessment - 02/02/21 1537     Subjective Patient reports she was on vacation and was not very compliant with HEP.    Limitations House hold activities    Diagnostic tests XR R shoulder: negative for fracture or any bony abnormalities    Patient Stated Goals Relief, being normal again    Currently in Pain? Yes    Pain Score 0-No pain   Max=6/10 lift bag into overhead   Pain Location Shoulder    Pain Descriptors / Indicators Aching                [facility] PT Assessment - 02/02/21 0001       Observation/Other Assessments   Focus on Therapeutic Outcomes (FOTO)  58% (64% predicted)      Sensation   Light Touch Appears Intact      AROM   Right Shoulder Flexion 140 Degrees    Right Shoulder ABduction 110 Degrees    Right Shoulder External Rotation 75 Degrees      PROM   Overall PROM Comments Mild pain in impingement patter that decreases at end range    Right Shoulder Flexion 160  Degrees    Right Shoulder ABduction 140 Degrees    Right Shoulder External Rotation 75 Degrees      Strength   Overall Strength Comments R post deltoid=4-/5    Right Shoulder Flexion 4+/5    Right Shoulder ABduction 4+/5    Right Shoulder Internal Rotation 4+/5    Right Shoulder External Rotation 4/5      Palpation   Palpation comment Mild TTP post shoulder and mid scap.      Neer Impingement test    Findings Negative      Hawkins-Kennedy test   Findings Negative      Painful Arc of Motion   Findings Positive                                     PT Short Term Goals - 02/02/21 1551       PT SHORT TERM GOAL #1   Title Pt will be I and compliant with initial HEP.    Baseline provided at initial eval  Status Achieved      PT SHORT TERM GOAL #2   Title Pt will increase AROM R shoulder by 20 degees in flexion, abduction, and ER with <4/10 pain.    Baseline pain with all ROM; Flex140deg, ABD 110deg, ER 75deg with impingement pain pattern 2/10 to 3/10 (12/29/2020), no pain with ER    Status Achieved               PT Long Term Goals - 02/02/21 1553       PT LONG TERM GOAL #1   Title Pt will be independent with advanced HEP for maintenance.    Status Achieved      PT LONG TERM GOAL #2   Title Pt will demonstrate pain free R shoulder AROM WFL.    Baseline Pain at end range of ABD and SCAPTION    Status Partially Met      PT LONG TERM GOAL #3   Title Pt will demo 5/5 L shoulder MMT with no pain.    Baseline at least 4+/5    Status Partially Met      PT LONG TERM GOAL #4   Title Pt will don/doff bra and clothing without pain.    Status Achieved      PT LONG TERM GOAL #5   Title Pt will increase FOTO ability to at least 64% ability, in order to demonstrate meaningful change in perceived level of functional ability.    Baseline 34% ability; 55 on 12/14/2020; 58% at discharge    Status Partially Met                   Plan -  02/02/21 1607     Clinical Impression Statement Patient has made excellent progress with R shoulder ROM and strength.  Patient continues to be challenged by active elevation with pain at end range particularly in abduction. Some  ER strength deficits.  Patient has attended PT for 2 months and is agreeable to DISCHARGE to independent HEP, understanding that she can still progress with consistant exercises, will require new referral for future PT neeeds.    Comorbidities HTN, heart murmur    Examination-Activity Limitations Bathing;Lift;Reach Overhead;Carry    Examination-Participation Restrictions Occupation;Cleaning;Laundry    PT Treatment/Interventions ADLs/Self Care Home Management;Cryotherapy;Electrical Stimulation;Iontophoresis 16m/ml Dexamethasone;Moist Heat;Therapeutic activities;Therapeutic exercise;Neuromuscular re-education;Patient/family education;Manual techniques;Passive range of motion;Dry needling;Taping;Joint Manipulations;Spinal Manipulations;Vasopneumatic Device    PT Next Visit Plan Discharge today    PT Home Exercise Plan [id number]    Consulted and Agree with Plan of Care Patient             Patient will benefit from skilled therapeutic intervention in order to improve the following deficits and impairments:     Visit Diagnosis: Muscle weakness (generalized)  Abnormal posture  Acute pain of right shoulder  Stiffness of right shoulder, not elsewhere classified     Problem List Patient Active Problem List   Diagnosis Date Noted   Type 2 diabetes mellitus with hyperglycemia, without long-term current use of insulin ([place] 12/09/2020   Sleep apnea 02/05/2019   Class 1 obesity due to excess calories with serious comorbidity and body mass index (BMI) of 34.0 to 34.9 in adult 02/05/2019   Dyslipidemia associated with type 2 diabetes mellitus ([facility] 06/09/2018   Hypertriglyceridemia 06/09/2018   Hypertension associated with diabetes ([facility] 08/18/2016   Iron deficiency  anemia due to chronic blood loss 08/18/2016   Fibroids 05/08/2012   Menorrhagia 04/15/2012    DPollyann Samples PT 02/02/2021,  [place], [place], [address] Phone: [phone]   Fax:  [phone]  Name: Christina Schwartz MRN: [id number] Date of Birth: 08/09/1970  PHYSICAL THERAPY DISCHARGE SUMMARY  Visits from Start of Care: 13  Current functional level related to goals / functional outcomes: Improved ability to perform work tasks without pain.   Remaining deficits: End range pain and R shoulder weakness   Education / Equipment: HEP   Patient agrees to discharge. Patient goals were partially met. Patient is being discharged due to being pleased with the current functional level. Patient understands that she will progress with good compliance with HEP and will require a referral for future PT needs.  Pollyann Samples, PT

## 2021-02-11 ENCOUNTER — Ambulatory Visit: Payer: 59 | Admitting: Registered"

## 2021-02-13 ENCOUNTER — Encounter: Payer: Self-pay | Admitting: Nurse Practitioner

## 2021-02-13 DIAGNOSIS — E1165 Type 2 diabetes mellitus with hyperglycemia: Secondary | ICD-10-CM

## 2021-02-14 DIAGNOSIS — G4733 Obstructive sleep apnea (adult) (pediatric): Secondary | ICD-10-CM | POA: Diagnosis not present

## 2021-02-16 ENCOUNTER — Other Ambulatory Visit (HOSPITAL_COMMUNITY): Payer: Self-pay

## 2021-02-16 MED ORDER — FREESTYLE LITE TEST VI STRP
ORAL_STRIP | 3 refills | Status: AC
Start: 2021-02-16 — End: ?
  Filled 2021-02-16: qty 50, 50d supply, fill #0
  Filled 2021-02-16: qty 100, fill #0

## 2021-02-16 MED ORDER — FREESTYLE LITE TEST VI STRP
ORAL_STRIP | 1 refills | Status: DC
  Filled 2021-02-16: qty 50, 50d supply, fill #0

## 2021-02-16 NOTE — Addendum Note (Signed)
Addended by: Wilfred Lacy L on: 02/16/2021 01:51 PM   Modules accepted: Orders

## 2021-03-03 ENCOUNTER — Encounter: Payer: Self-pay | Admitting: Nurse Practitioner

## 2021-03-03 DIAGNOSIS — I10 Essential (primary) hypertension: Secondary | ICD-10-CM

## 2021-03-04 ENCOUNTER — Ambulatory Visit: Payer: 59 | Admitting: Registered"

## 2021-03-11 ENCOUNTER — Ambulatory Visit: Payer: 59 | Admitting: Nurse Practitioner

## 2021-03-28 ENCOUNTER — Other Ambulatory Visit (HOSPITAL_COMMUNITY): Payer: Self-pay

## 2021-03-29 ENCOUNTER — Other Ambulatory Visit: Payer: Self-pay | Admitting: Physician Assistant

## 2021-03-29 DIAGNOSIS — I10 Essential (primary) hypertension: Secondary | ICD-10-CM

## 2021-03-30 ENCOUNTER — Other Ambulatory Visit (HOSPITAL_COMMUNITY): Payer: Self-pay

## 2021-03-30 MED ORDER — ATENOLOL 50 MG PO TABS
50.0000 mg | ORAL_TABLET | Freq: Every day | ORAL | 0 refills | Status: DC
  Filled 2021-03-30: qty 30, 30d supply, fill #0

## 2021-03-30 NOTE — Telephone Encounter (Signed)
Lov 12/09/2020 Fov 04/22/2021

## 2021-04-04 ENCOUNTER — Ambulatory Visit: Payer: 59 | Admitting: Nurse Practitioner

## 2021-04-04 ENCOUNTER — Encounter: Payer: 59 | Attending: Nurse Practitioner | Admitting: Registered"

## 2021-04-04 ENCOUNTER — Encounter: Payer: Self-pay | Admitting: Registered"

## 2021-04-04 ENCOUNTER — Other Ambulatory Visit: Payer: Self-pay

## 2021-04-04 DIAGNOSIS — E1165 Type 2 diabetes mellitus with hyperglycemia: Secondary | ICD-10-CM | POA: Insufficient documentation

## 2021-04-04 NOTE — Progress Notes (Signed)
Diabetes Self-Management Education  Visit Type: Follow-up  Appt. Start Time: 1440 Appt. End Time: 2035  04/04/2021  [name], identified by name and date of birth, is a 50 y.o. female with a diagnosis of Diabetes: Type 2.   ASSESSMENT  There were no vitals taken for this visit. There is no height or weight on file to calculate BMI.  SMBG: from patients log readings it is likely that her next A1c will be less than 7%.  Patient states she changed metformin to dinner time and has helped resolve GI issues.   Pt reports she has worked on all her goals from last visit. Pt reports she fell off a little bit, but has got back into the routine of meal/snack prepping and using portion plates to help her create balanced meals. Pt reports she is finding recipe ideas on the Internet to keep her meals/snacks interesting. Pt reports she still has the candy jar at work but is not tempted to eat candy from it.   Pt states she has met her physical activity goal from the last visit and can feels a difference with exercise which is motivating to continue with current activity level. Patient joined [employer] and usually does cardio with the bike with some resistance, and the treadmill.  Pt states her long term goals wants to lose weight and eventually get off the medicine.  Pt states sometimes has blood sugar go up when she is staying up late working on assignments. 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    Diabetes Self-Management Education - 04/04/21 1447       Visit Information   Visit Type Follow-up      Initial Visit   Diabetes Type Type 2    Are you taking your medications as prescribed? Yes      Complications   How often do you check your blood sugar? 1-2 times/day    Fasting Blood glucose range (mg/dL) 70-129   110-125   Postprandial Blood glucose range (mg/dL) 70-129;130-179   just a couple of times over 180   Number of hypoglycemic episodes per month 0       Dietary Intake   Breakfast skipped last couple of days    Snack (morning) none    Lunch spaghetti made with chickpea noodles, spaghetti meat sauce, grapes & orange    Snack (afternoon) cashews    Dinner chicken and green beans    Snack (evening) none    Beverage(s) water      Exercise   Exercise Type Moderate (swimming / aerobic walking)    How many days per week to you exercise? 3    How many minutes per day do you exercise? 30    Total minutes per week of exercise 90      Patient Education   Physical activity and exercise  Other (comment)   similar effect as metformin on BG   Medications Reviewed patients medication for diabetes, action, purpose, timing of dose and side effects.      Individualized Goals (developed by patient)   Nutrition General guidelines for healthy choices and portions discussed    Physical Activity Exercise 3-5 times per week    Monitoring  test my blood glucose as discussed      Patient Self-Evaluation of Goals - Patient rates self as meeting previously set goals (% of time)   Nutrition >75%    Physical Activity >75%    Medications >75%  Monitoring >75%      Outcomes   Expected Outcomes Demonstrated interest in learning. Expect positive outcomes    Future DMSE Yearly    Program Status Completed      Subsequent Visit   Since your last visit, are you checking your blood glucose at least once a day? Yes             Individualized Plan for Diabetes Self-Management Training:   Learning Objective:  Patient will have a greater understanding of diabetes self-management. Patient education plan is to attend individual and/or group sessions per assessed needs and concerns.  Patient Instructions  Continue with your goals: Exercise 3x/week 30+ minutes Use your portion plates to create balanced meals. Check your blood sugar ~2x/day Fasting and 2 hours after some meals.  Expected Outcomes:  Demonstrated interest in learning. Expect positive  outcomes  Education material provided: none  If problems or questions, patient to contact team via:  Phone and MyChart  Future DSME appointment: Yearly

## 2021-04-04 NOTE — Patient Instructions (Signed)
Continue with your goals: Exercise 3x/week 30+ minutes Use your portion plates to create balanced meals. Check your blood sugar ~2x/day Fasting and 2 hours after some meals.

## 2021-04-22 ENCOUNTER — Ambulatory Visit (INDEPENDENT_AMBULATORY_CARE_PROVIDER_SITE_OTHER): Payer: 59 | Admitting: Nurse Practitioner

## 2021-04-22 ENCOUNTER — Other Ambulatory Visit: Payer: Self-pay

## 2021-04-22 ENCOUNTER — Other Ambulatory Visit (HOSPITAL_COMMUNITY): Payer: Self-pay

## 2021-04-22 ENCOUNTER — Encounter: Payer: Self-pay | Admitting: Nurse Practitioner

## 2021-04-22 VITALS — BP 136/84 | HR 70 | Temp 97.0°F | Ht 66.0 in | Wt 238.2 lb

## 2021-04-22 DIAGNOSIS — E1159 Type 2 diabetes mellitus with other circulatory complications: Secondary | ICD-10-CM

## 2021-04-22 DIAGNOSIS — M25472 Effusion, left ankle: Secondary | ICD-10-CM

## 2021-04-22 DIAGNOSIS — E1165 Type 2 diabetes mellitus with hyperglycemia: Secondary | ICD-10-CM | POA: Diagnosis not present

## 2021-04-22 DIAGNOSIS — M25471 Effusion, right ankle: Secondary | ICD-10-CM | POA: Diagnosis not present

## 2021-04-22 DIAGNOSIS — E1169 Type 2 diabetes mellitus with other specified complication: Secondary | ICD-10-CM

## 2021-04-22 DIAGNOSIS — E785 Hyperlipidemia, unspecified: Secondary | ICD-10-CM | POA: Diagnosis not present

## 2021-04-22 DIAGNOSIS — I152 Hypertension secondary to endocrine disorders: Secondary | ICD-10-CM

## 2021-04-22 DIAGNOSIS — I872 Venous insufficiency (chronic) (peripheral): Secondary | ICD-10-CM | POA: Insufficient documentation

## 2021-04-22 LAB — BASIC METABOLIC PANEL
BUN: 16 mg/dL (ref 6–23)
CO2: 28 mEq/L (ref 19–32)
Calcium: 9.4 mg/dL (ref 8.4–10.5)
Chloride: 101 mEq/L (ref 96–112)
Creatinine, Ser: 0.81 mg/dL (ref 0.40–1.20)
GFR: 84.75 mL/min (ref >60.00)
Glucose, Bld: 112 mg/dL — ABNORMAL HIGH (ref 70–99)
Potassium: 3.9 mEq/L (ref 3.5–5.1)
Sodium: 139 mEq/L (ref 135–145)

## 2021-04-22 LAB — HEMOGLOBIN A1C: Hgb A1c MFr Bld: 7 % — ABNORMAL HIGH (ref 4.6–6.5)

## 2021-04-22 LAB — LIPID PANEL
Cholesterol: 142 mg/dL (ref 0–200)
HDL: 43.7 mg/dL (ref >39.00)
LDL Cholesterol: 72 mg/dL (ref 0–99)
NonHDL: 98
Total CHOL/HDL Ratio: 3
Triglycerides: 129 mg/dL (ref 0.0–149.0)
VLDL: 25.8 mg/dL (ref 0.0–40.0)

## 2021-04-22 MED ORDER — ATENOLOL 50 MG PO TABS
50.0000 mg | ORAL_TABLET | Freq: Every day | ORAL | 3 refills | Status: DC
  Filled 2021-04-22 – 2021-05-13 (×3): qty 90, 90d supply, fill #0
  Filled 2021-08-25: qty 90, 90d supply, fill #1

## 2021-04-22 MED ORDER — HYDROCHLOROTHIAZIDE 12.5 MG PO CAPS
12.5000 mg | ORAL_CAPSULE | Freq: Every day | ORAL | 1 refills | Status: DC | PRN
  Filled 2021-04-22: qty 30, 30d supply, fill #0

## 2021-04-22 NOTE — Progress Notes (Signed)
Subjective:  Patient ID: Christina Schwartz, female    DOB: 16-Jun-1971  Age: 50 y.o. MRN: [id number]  CC: Follow-up (3 month f/u on DM, HTN, and cholesterol. Pt is fasting. [facility] eye exam scheduled for November 2022/Declines vaccines today. )  HPI  Hypertension associated with diabetes ([place]) Unable to tolerated lisinopril: developed cough and dizziness Current use of atenolol. She has also started exercise 3x/week (cardio and weight training) and changed to low sodium diet. Reports intermittent ankle edema, improves with elevation and exercise. BP at goal at this time. BP Readings from Last 3 Encounters:  04/22/21 136/84  12/09/20 122/76  12/07/20 116/82   Maintain atenolol dose Sent HCTZ 12.5mg , advise to take 1tab daily prn for edema Repeat BMP  Dyslipidemia associated with type 2 diabetes mellitus ([place]) Repeat lipid panel today  Type 2 diabetes mellitus with hyperglycemia, without long-term current use of insulin (H[facility]) Fasting glucose at 90 Postprandial glucose at 150 No neuropathy Current use of metformin XR once a day, denies any adverse side effects  Repeat HgbA1c and BMP Advised to have DM eye exam report faxed to me once completed  Reviewed past Medical, Social and Family history today.  Outpatient Medications Prior to Visit  Medication Sig Dispense Refill   Blood Glucose Monitoring Suppl (FREESTYLE LITE) DEVI      FREESTYLE LITE test strip Check glucose once a day in the morning 100 each 3   Iron-Vitamins (GERITOL PO) Take 1 tablet by mouth daily.     Lancets (FREESTYLE) lancets      metFORMIN (GLUCOPHAGE-XR) 500 MG 24 hr tablet Take 1 tablet (500 mg total) by mouth daily for 14 days with the heaviest meal of day, THEN take 1 tablet (500 mg total) 2 (two) times daily, with breakfast and supper 180 tablet 3   atenolol (TENORMIN) 50 MG tablet Take 1 tablet (50 mg total) by mouth daily. 30 tablet 0   lisinopril-hydrochlorothiazide (ZESTORETIC) 10-12.5 MG tablet  Take 1 tablet by mouth daily. 90 tablet 1   No facility-administered medications prior to visit.    ROS See HPI  Objective:  BP 136/84 (BP Location: Left Arm, Patient Position: Sitting, Cuff Size: Large)   Pulse 70   Temp (!) 97 F (36.1 C) (Temporal)   Ht 5\' 6"  (1.676 m)   Wt 238 lb 3.2 oz (108 kg)   SpO2 99%   BMI 38.45 kg/m   Physical Exam Vitals reviewed.  Constitutional:      Appearance: She is obese.  Cardiovascular:     Rate and Rhythm: Normal rate and regular rhythm.     Pulses: Normal pulses.     Heart sounds: Normal heart sounds.  Pulmonary:     Effort: Pulmonary effort is normal.     Breath sounds: Normal breath sounds.  Musculoskeletal:     Right lower leg: Edema present.     Left lower leg: Edema present.  Neurological:     Mental Status: She is alert and oriented to person, place, and time.   Assessment & Plan:  This visit occurred during the SARS-CoV-2 public health emergency.  Safety protocols were in place, including screening questions prior to the visit, additional usage of staff PPE, and extensive cleaning of exam room while observing appropriate contact time as indicated for disinfecting solutions.   Ayssa was seen today for follow-up.  Diagnoses and all orders for this visit:  Hypertension associated with diabetes ([place]) -     atenolol (TENORMIN) 50 MG tablet; Take 1  tablet (50 mg total) by mouth daily. -     Basic metabolic panel -     hydrochlorothiazide (MICROZIDE) 12.5 MG capsule; Take 1 capsule (12.5 mg total) by mouth daily as needed.  Type 2 diabetes mellitus with hyperglycemia, without long-term current use of insulin (HCC) -     Hemoglobin A1c -     Basic metabolic panel  Dyslipidemia associated with type 2 diabetes mellitus (HCC) -     Lipid panel  Ankle edema, bilateral -     hydrochlorothiazide (MICROZIDE) 12.5 MG capsule; Take 1 capsule (12.5 mg total) by mouth daily as needed.  Problem List Items Addressed This Visit        Cardiovascular and Mediastinum   Hypertension associated with diabetes ([place]) - Primary    Unable to tolerated lisinopril: developed cough and dizziness Current use of atenolol. She has also started exercise 3x/week (cardio and weight training) and changed to low sodium diet. Reports intermittent ankle edema, improves with elevation and exercise. BP at goal at this time. BP Readings from Last 3 Encounters:  04/22/21 136/84  12/09/20 122/76  12/07/20 116/82   Maintain atenolol dose Sent HCTZ 12.5mg , advise to take 1tab daily prn for edema Repeat BMP      Relevant Medications   atenolol (TENORMIN) 50 MG tablet   hydrochlorothiazide (MICROZIDE) 12.5 MG capsule   Other Relevant Orders   Basic metabolic panel     Endocrine   Dyslipidemia associated with type 2 diabetes mellitus ([place])    Repeat lipid panel today      Relevant Orders   Lipid panel   Type 2 diabetes mellitus with hyperglycemia, without long-term current use of insulin (HCC)    Fasting glucose at 90 Postprandial glucose at 150 No neuropathy Current use of metformin XR once a day, denies any adverse side effects  Repeat HgbA1c and BMP Advised to have DM eye exam report faxed to me once completed      Relevant Orders   Hemoglobin D7A   Basic metabolic panel   Other Visit Diagnoses     Ankle edema, bilateral       Relevant Medications   hydrochlorothiazide (MICROZIDE) 12.5 MG capsule       Follow-up: Return in about 3 months (around 07/23/2021) for CPE (fasting).  Wilfred Lacy, NP

## 2021-04-22 NOTE — Assessment & Plan Note (Addendum)
Unable to tolerated lisinopril: developed cough and dizziness Current use of atenolol. She has also started exercise 3x/week (cardio and weight training) and changed to low sodium diet. Reports intermittent ankle edema, improves with elevation and exercise. BP at goal at this time. BP Readings from Last 3 Encounters:  04/22/21 136/84  12/09/20 122/76  12/07/20 116/82   Maintain atenolol dose Sent HCTZ 12.5mg , advise to take 1tab daily prn for edema Repeat BMP

## 2021-04-22 NOTE — Patient Instructions (Addendum)
Negative protein in urine, so we can hold on adding valsartan. Sent hctz to use prn for leg edema. Maintain current medications  Go to lab for blood draw  Maintain daily exercise and dietary changes as directed by nutritionist.

## 2021-04-22 NOTE — Assessment & Plan Note (Addendum)
Fasting glucose at 90 Postprandial glucose at 150 No neuropathy Current use of metformin XR once a day, denies any adverse side effects  Repeat HgbA1c and BMP: Stable lab results with hgbA1c at 7.0% Increase metformin to 750mg  XR daily with meal Advised to have DM eye exam report faxed to me once completed F/up in 70months

## 2021-04-22 NOTE — Assessment & Plan Note (Signed)
Repeat lipid panel today. 

## 2021-04-26 ENCOUNTER — Other Ambulatory Visit (HOSPITAL_COMMUNITY): Payer: Self-pay

## 2021-04-26 MED ORDER — METFORMIN HCL ER 750 MG PO TB24
750.0000 mg | ORAL_TABLET | Freq: Every day | ORAL | 3 refills | Status: DC
Start: 2021-04-26 — End: 2021-09-30
  Filled 2021-04-26: qty 70, 70d supply, fill #0
  Filled 2021-04-26: qty 20, 20d supply, fill #0

## 2021-04-26 NOTE — Addendum Note (Signed)
Addended by: Leana Gamer on: 04/26/2021 09:41 AM   Modules accepted: Orders

## 2021-04-29 ENCOUNTER — Other Ambulatory Visit (HOSPITAL_COMMUNITY): Payer: Self-pay

## 2021-04-29 MED ORDER — ATORVASTATIN CALCIUM 20 MG PO TABS
20.0000 mg | ORAL_TABLET | Freq: Every day | ORAL | 3 refills | Status: DC
  Filled 2021-04-29: qty 90, 90d supply, fill #0

## 2021-04-29 NOTE — Addendum Note (Signed)
Addended by: Wilfred Lacy L on: 04/29/2021 12:47 PM   Modules accepted: Orders

## 2021-05-09 ENCOUNTER — Other Ambulatory Visit (HOSPITAL_COMMUNITY): Payer: Self-pay

## 2021-05-13 ENCOUNTER — Other Ambulatory Visit (HOSPITAL_COMMUNITY): Payer: Self-pay

## 2021-05-23 DIAGNOSIS — G4733 Obstructive sleep apnea (adult) (pediatric): Secondary | ICD-10-CM | POA: Diagnosis not present

## 2021-06-09 ENCOUNTER — Encounter: Payer: Self-pay | Admitting: Nurse Practitioner

## 2021-07-26 ENCOUNTER — Encounter: Payer: Self-pay | Admitting: Nurse Practitioner

## 2021-07-29 ENCOUNTER — Ambulatory Visit: Payer: 59 | Admitting: Nurse Practitioner

## 2021-08-19 ENCOUNTER — Ambulatory Visit: Payer: 59 | Admitting: Nurse Practitioner

## 2021-08-26 ENCOUNTER — Other Ambulatory Visit (HOSPITAL_COMMUNITY): Payer: Self-pay

## 2021-08-30 ENCOUNTER — Other Ambulatory Visit: Payer: Self-pay | Admitting: Nurse Practitioner

## 2021-08-30 DIAGNOSIS — Z1231 Encounter for screening mammogram for malignant neoplasm of breast: Secondary | ICD-10-CM

## 2021-08-31 DIAGNOSIS — G4733 Obstructive sleep apnea (adult) (pediatric): Secondary | ICD-10-CM | POA: Diagnosis not present

## 2021-09-15 ENCOUNTER — Encounter: Payer: Self-pay | Admitting: Family Medicine

## 2021-09-29 NOTE — Progress Notes (Signed)
? ?Christina Schwartz 1971/01/23 [id number] ? ? ?History:  51 y.o. G4P4 presents for annual exam as a new patient. Labs drawn with PCP this AM, has mammo today. PCP referred for colonoscopy. Has been on Metformin for 2 years, hgbA1c has actually increased. Had it redrawn this morning. Interested in GLP-1 medication to help with weight loss and DM. Exercising- walking but no improvement in weight.  ? ?Gynecologic History ?Patient's last menstrual period was 07/24/2020. ?  ?Contraception/Family planning: post menopausal status ?Sexually active: yes ?Last Pap: 2019. Results were: normal ?Last mammogram: 2021. Results were: normal ? ?Obstetric History ?OB History  ?Gravida Para Term Preterm AB Living  ?'4 4 4     4  '$ ?SAB IAB Ectopic Multiple Live Births  ?           ?  ?# Outcome Date GA Lbr Len/2nd Weight Sex Delivery Anes PTL Lv  ?4 Term           ?3 Term           ?2 Term           ?1 Term           ? ? ? ?The following portions of the patient's history were reviewed and updated as appropriate: allergies, current medications, past family history, past medical history, past social history, past surgical history, and problem list. ? ?Review of Systems ?Pertinent items noted in HPI and remainder of comprehensive ROS otherwise negative.  ? ?Past medical history, past surgical history, family history and social history were all reviewed and documented in the EPIC chart. ? ? ?Exam: ? ?Vitals:  ? 09/30/21 1017  ?BP: (!) 146/88  ?Weight: 239 lb (108.4 kg)  ?Height: 5' 5.5" (1.664 m)  ? ?Body mass index is 39.17 kg/m?. ? ?General appearance:  Normal ?Thyroid:  Symmetrical, normal in size, without palpable masses or nodularity. ?Respiratory ? Auscultation:  Clear without wheezing or rhonchi ?Cardiovascular ? Auscultation:  Regular rate, without rubs, murmurs or gallops ? Edema/varicosities:  Not grossly evident ?Abdominal ? Soft,nontender, without masses, guarding or rebound. ? Liver/spleen:  No organomegaly noted ? Hernia:  None  appreciated ? Skin ? Inspection:  Grossly normal ?Breasts: Examined lying and sitting.  ? Right: Without masses, retractions, nipple discharge or axillary adenopathy. ? ? Left: Without masses, retractions, nipple discharge or axillary adenopathy. ?Genitourinary  ? Inguinal/mons:  Normal without inguinal adenopathy ? External genitalia:  Normal appearing vulva with no masses, tenderness, or lesions ? BUS/Urethra/Skene's glands:  Normal without masses or exudate ? Vagina:  Normal appearing with normal color and discharge, no lesions ? Cervix:  Normal appearing without discharge or lesions ? Uterus:  Normal in size, shape and contour.  Mobile, nontender ? Adnexa/parametria:   ?  Rt: Normal in size, without masses or tenderness. ?  Lt: Normal in size, without masses or tenderness. ? Anus and perineum: Normal ?  ?Patient informed chaperone available to be present for breast and pelvic exam. Patient has requested no chaperone to be present. Patient has been advised what will be completed during breast and pelvic exam.  ? ?Assessment/Plan:   ?1. Well female exam with routine gynecological exam ?Mammo today ?- Cytology - PAP( [facility]) ? ?2. Hypertension associated with diabetes ([place]) ?Continue exercise and heart health diet  ? ?3. Severe obesity with body mass index (BMI) of 35.0 to 39.9 with comorbidity ([place]) ?Discuss switching to GLP-1 medication with PCP ?  ?Discussed SBE, colonoscopy and DEXA screening as directed/appropriate. Recommend  140mns of exercise weekly, including weight bearing exercise. Encouraged the use of seatbelts and sunscreen. ?Return in 1 year for annual or as needed.  ? ?CKerry DoryWHNP-BC 10:26 AM 09/30/2021  ? ?

## 2021-09-30 ENCOUNTER — Other Ambulatory Visit (HOSPITAL_COMMUNITY)
Admission: RE | Admit: 2021-09-30 | Discharge: 2021-09-30 | Disposition: A | Discharge status: Home / Self Care | Payer: 59 | Source: Ambulatory Visit | Attending: Radiology | Admitting: Radiology

## 2021-09-30 ENCOUNTER — Ambulatory Visit (INDEPENDENT_AMBULATORY_CARE_PROVIDER_SITE_OTHER): Payer: 59 | Admitting: Radiology

## 2021-09-30 ENCOUNTER — Ambulatory Visit (INDEPENDENT_AMBULATORY_CARE_PROVIDER_SITE_OTHER): Payer: 59 | Admitting: Nurse Practitioner

## 2021-09-30 ENCOUNTER — Encounter: Payer: Self-pay | Admitting: Nurse Practitioner

## 2021-09-30 ENCOUNTER — Ambulatory Visit
Admission: RE | Admit: 2021-09-30 | Discharge: 2021-09-30 | Disposition: A | Discharge status: Home / Self Care | Payer: 59 | Source: Ambulatory Visit | Attending: Nurse Practitioner | Admitting: Nurse Practitioner

## 2021-09-30 ENCOUNTER — Other Ambulatory Visit: Payer: Self-pay

## 2021-09-30 ENCOUNTER — Encounter: Payer: Self-pay | Admitting: Radiology

## 2021-09-30 ENCOUNTER — Other Ambulatory Visit (HOSPITAL_COMMUNITY): Payer: Self-pay

## 2021-09-30 VITALS — BP 130/90 | HR 70 | Temp 96.4°F | Ht 65.5 in | Wt 239.4 lb

## 2021-09-30 VITALS — BP 146/88 | Ht 65.5 in | Wt 239.0 lb

## 2021-09-30 DIAGNOSIS — Z01419 Encounter for gynecological examination (general) (routine) without abnormal findings: Secondary | ICD-10-CM | POA: Insufficient documentation

## 2021-09-30 DIAGNOSIS — I152 Hypertension secondary to endocrine disorders: Secondary | ICD-10-CM | POA: Diagnosis not present

## 2021-09-30 DIAGNOSIS — Z1231 Encounter for screening mammogram for malignant neoplasm of breast: Secondary | ICD-10-CM | POA: Diagnosis not present

## 2021-09-30 DIAGNOSIS — E785 Hyperlipidemia, unspecified: Secondary | ICD-10-CM

## 2021-09-30 DIAGNOSIS — D500 Iron deficiency anemia secondary to blood loss (chronic): Secondary | ICD-10-CM | POA: Diagnosis not present

## 2021-09-30 DIAGNOSIS — E1159 Type 2 diabetes mellitus with other circulatory complications: Secondary | ICD-10-CM | POA: Diagnosis not present

## 2021-09-30 DIAGNOSIS — Z0001 Encounter for general adult medical examination with abnormal findings: Secondary | ICD-10-CM | POA: Diagnosis not present

## 2021-09-30 DIAGNOSIS — Z1211 Encounter for screening for malignant neoplasm of colon: Secondary | ICD-10-CM | POA: Diagnosis not present

## 2021-09-30 DIAGNOSIS — E1169 Type 2 diabetes mellitus with other specified complication: Secondary | ICD-10-CM

## 2021-09-30 DIAGNOSIS — E1165 Type 2 diabetes mellitus with hyperglycemia: Secondary | ICD-10-CM

## 2021-09-30 DIAGNOSIS — I872 Venous insufficiency (chronic) (peripheral): Secondary | ICD-10-CM | POA: Diagnosis not present

## 2021-09-30 LAB — HEPATIC FUNCTION PANEL
ALT: 30 U/L (ref 0–35)
AST: 22 U/L (ref 0–37)
Albumin: 4.2 g/dL (ref 3.5–5.2)
Alkaline Phosphatase: 70 U/L (ref 39–117)
Bilirubin, Direct: 0.2 mg/dL (ref 0.0–0.3)
Total Bilirubin: 0.9 mg/dL (ref 0.2–1.2)
Total Protein: 7.2 g/dL (ref 6.0–8.3)

## 2021-09-30 LAB — HEMOGLOBIN A1C: Hgb A1c MFr Bld: 7.3 % — ABNORMAL HIGH (ref 4.6–6.5)

## 2021-09-30 LAB — CBC
HCT: 38.4 % (ref 36.0–46.0)
Hemoglobin: 12.4 g/dL (ref 12.0–15.0)
MCHC: 32.2 g/dL (ref 30.0–36.0)
MCV: 73.7 fl — ABNORMAL LOW (ref 78.0–100.0)
Platelets: 245 10*3/uL (ref 150.0–400.0)
RBC: 5.21 Mil/uL — ABNORMAL HIGH (ref 3.87–5.11)
RDW: 16.8 % — ABNORMAL HIGH (ref 11.5–15.5)
WBC: 6.2 10*3/uL (ref 4.0–10.5)

## 2021-09-30 LAB — BASIC METABOLIC PANEL
BUN: 16 mg/dL (ref 6–23)
CO2: 29 mEq/L (ref 19–32)
Calcium: 9.4 mg/dL (ref 8.4–10.5)
Chloride: 103 mEq/L (ref 96–112)
Creatinine, Ser: 0.81 mg/dL (ref 0.40–1.20)
GFR: 84.49 mL/min (ref >60.00)
Glucose, Bld: 123 mg/dL — ABNORMAL HIGH (ref 70–99)
Potassium: 3.6 mEq/L (ref 3.5–5.1)
Sodium: 141 mEq/L (ref 135–145)

## 2021-09-30 LAB — LIPID PANEL
Cholesterol: 138 mg/dL (ref 0–200)
HDL: 41.9 mg/dL (ref >39.00)
LDL Cholesterol: 73 mg/dL (ref 0–99)
NonHDL: 96.51
Total CHOL/HDL Ratio: 3
Triglycerides: 117 mg/dL (ref 0.0–149.0)
VLDL: 23.4 mg/dL (ref 0.0–40.0)

## 2021-09-30 MED ORDER — ATORVASTATIN CALCIUM 20 MG PO TABS
20.0000 mg | ORAL_TABLET | Freq: Every day | ORAL | 3 refills | Status: DC
  Filled 2021-09-30: qty 90, 90d supply, fill #0

## 2021-09-30 MED ORDER — ATENOLOL 50 MG PO TABS
50.0000 mg | ORAL_TABLET | Freq: Every day | ORAL | 3 refills | Status: DC
  Filled 2021-09-30 – 2021-12-18 (×2): qty 90, 90d supply, fill #0
  Filled 2022-04-19: qty 90, 90d supply, fill #1

## 2021-09-30 MED ORDER — HYDROCHLOROTHIAZIDE 12.5 MG PO CAPS
12.5000 mg | ORAL_CAPSULE | Freq: Every day | ORAL | 1 refills | Status: DC | PRN
  Filled 2021-09-30: qty 90, 90d supply, fill #0

## 2021-09-30 MED ORDER — METFORMIN HCL ER 750 MG PO TB24
750.0000 mg | ORAL_TABLET | Freq: Every day | ORAL | 3 refills | Status: DC
  Filled 2021-09-30 – 2022-04-19 (×2): qty 90, 90d supply, fill #0

## 2021-09-30 NOTE — Assessment & Plan Note (Signed)
Continue use of use of HCTZ, compressions stocking, exercise and DASh diet ?

## 2021-09-30 NOTE — Progress Notes (Signed)
? ?Complete physical exam ? ?Patient: Christina Schwartz   DOB: [birth year]   51 y.o. Female  MRN: [id number] ?Visit Date: 09/30/2021 ? ?Subjective:  ?  ?Chief Complaint  ?Patient presents with  ? Annual Exam  ?  Physical-No breast or pap exam needed, pt has GYN.  ?Pt is fasting.  ?Diabetic eye exam scheduled for 10/2021 ?Declines shingles vaccine.   ? ?Christina Schwartz is a 51 y.o. female who presents today for a complete physical exam. She reports consuming a general diet.  none  She generally feels well. She reports sleeping fairly well. She does have additional problems to discuss today.  ?Vision:Yes ?Dental:Yes ?STD Screen:No ? ?Most recent fall risk assessment: ? ?  09/30/2021  ?  7:58 AM  ?Fall Risk   ?Falls in the past year? 0  ?Number falls in past yr: 0  ?Injury with Fall? 0  ?Risk for fall due to : No Fall Risks  ?Follow up Falls evaluation completed  ? ?Most recent depression screenings: ? ?  09/30/2021  ?  7:58 AM 12/31/2020  ?  8:05 AM  ?PHQ 2/9 Scores  ?PHQ - 2 Score 0 0  ? ?HPI  ?Hypertension associated with diabetes ([place]) ?Reports she was not consistent with medications nor diet in last 61month. This was due to moving homes. ?No HA or CP or dizziness ?Chronic LE edema: improves with use of compressions stocking and exercise ?BP Readings from Last 3 Encounters:  ?09/30/21 130/90  ?04/22/21 136/84  ?12/09/20 122/76  ? ?Repeat CMP: normal ?Maintain med doses ?Refill sent ?Advised about the importance of med and diet compliance. ?F/up in 362month? ?Type 2 diabetes mellitus with hyperglycemia, without long-term current use of insulin ([place]?Fasting glucose: 103 ?Nonfasting: 153, 189 ?Exercise: started walking daily, 20-3041m ?Diet: working on improving ?No adverse effects with metfomin ?Up to date with eye exam and dental cleaning. ?Normal foot exam ? ?Repeat hgbA1c and CMP: stable ?Maintain med dose ? ?Dyslipidemia associated with type 2 diabetes mellitus ([place] lipid panel: improved LDL. ASCVD risk at  8.9% ?Continue atorvastatin dose ? ?Iron deficiency anemia due to chronic blood loss ?Repeat cbc: normal ? ?Venous insufficiency ?Continue use of use of HCTZ, compressions stocking, exercise and DASh diet ? ?Past Medical History:  ?Diagnosis Date  ? Anemia   ? Heart murmur   ? Hypertension   ? ?Past Surgical History:  ?Procedure Laterality Date  ? TUBAL LIGATION    ? WISDOM TOOTH EXTRACTION    ? ?Social History  ? ?Socioeconomic History  ? Marital status: Legally Separated  ?  Spouse name: bruce  ? Number of children: 4  ? Years of education: some college  ? Highest education level: Not on file  ?Occupational History  ? Occupation: environmental services  ?  Employer: [employer]  ?Tobacco Use  ? Smoking status: Never  ? Smokeless tobacco: Never  ?Vaping Use  ? Vaping Use: Never used  ?Substance and Sexual Activity  ? Alcohol use: No  ? Drug use: No  ? Sexual activity: Not Currently  ?  Partners: Male  ?  Birth control/protection: Post-menopausal  ?  Comment: BTL  ?Other Topics Concern  ? Not on file  ?Social History Narrative  ? Lives with 4 children.  ?   ? No gums in home.  ? Wears seatbelt.  ? ?Social Determinants of Health  ? ?Financial Resource Strain: Not on file  ?Food Insecurity: Not on file  ?Transportation Needs: Not on file  ?Physical  Activity: Not on file  ?Stress: Not on file  ?Social Connections: Not on file  ?Intimate Partner Violence: Not on file  ? ?Family Status  ?Relation Name Status  ? Mother  Alive  ? Father  Deceased  ? MGM  Deceased  ? MGF  Deceased  ? PGM  Deceased  ? PGF  Deceased  ? Sister  Alive  ? Son  Alive  ? Son  Alive  ? Son  Alive  ? Daughter  Alive  ? ?Family History  ?Problem Relation Age of Onset  ? Hypertension Father   ? Pulmonary embolism Father   ? Diabetes Maternal Grandmother   ? Diabetes Sister   ? Lymphoma Sister   ? ?Allergies  ?Allergen Reactions  ? Amlodipine Other (See Comments)  ?  Headache and dizziness  ? Lisinopril Cough  ?  ?Patient Care Team: ?Christina Schwartz, Charlene Brooke, NP as PCP - General (Internal Medicine)  ? ?Medications: ?Outpatient Medications Prior to Visit  ?Medication Sig  ? Blood Glucose Monitoring Suppl (FREESTYLE LITE) DEVI   ? FREESTYLE LITE test strip Check glucose once a day in the morning  ? Iron-Vitamins (GERITOL PO) Take 1 tablet by mouth daily.  ? Lancets (FREESTYLE) lancets   ? [DISCONTINUED] atenolol (TENORMIN) 50 MG tablet Take 1 tablet (50 mg total) by mouth daily.  ? [DISCONTINUED] atorvastatin (LIPITOR) 20 MG tablet Take 1 tablet (20 mg total) by mouth daily.  ? [DISCONTINUED] hydrochlorothiazide (MICROZIDE) 12.5 MG capsule Take 1 capsule (12.5 mg total) by mouth daily as needed.  ? [DISCONTINUED] metFORMIN (GLUCOPHAGE-XR) 750 MG 24 hr tablet Take 1 tablet (750 mg total) by mouth daily with breakfast.  ? ?No facility-administered medications prior to visit.  ? ? ?Review of Systems  ?Constitutional:  Negative for fever.  ?HENT:  Negative for congestion and sore throat.   ?Eyes:   ?     Negative for visual changes  ?Respiratory:  Negative for cough and shortness of breath.   ?Cardiovascular:  Negative for chest pain, palpitations and leg swelling.  ?Gastrointestinal:  Negative for blood in stool, constipation and diarrhea.  ?Genitourinary:  Negative for dysuria, frequency and urgency.  ?Musculoskeletal:  Negative for myalgias.  ?Skin:  Negative for rash.  ?Neurological:  Negative for dizziness and headaches.  ?Hematological:  Does not bruise/bleed easily.  ?Psychiatric/Behavioral:  Negative for suicidal ideas. The patient is not nervous/anxious.   ? ? ?   ?Objective:  ?BP 130/90 (BP Location: Left Arm, Patient Position: Sitting, Cuff Size: Large)   Pulse 70   Temp (!) 96.4 ?F (35.8 ?C) (Temporal)   Ht 5' 5.5" (1.664 m)   Wt 239 lb 6.4 oz (108.6 kg)   LMP 07/24/2020   SpO2 99%   BMI 39.23 kg/m?  ?  ? ? ? ?Physical Exam ?Vitals reviewed.  ?Constitutional:   ?   General: She is not in acute distress. ?   Appearance: She is well-developed.  ?HENT:  ?    Right Ear: External ear normal.  ?   Left Ear: External ear normal.  ?Eyes:  ?   Extraocular Movements: Extraocular movements intact.  ?   Conjunctiva/sclera: Conjunctivae normal.  ?Cardiovascular:  ?   Rate and Rhythm: Normal rate and regular rhythm.  ?   Heart sounds: Normal heart sounds.  ?Pulmonary:  ?   Effort: Pulmonary effort is normal. No respiratory distress.  ?   Breath sounds: Normal breath sounds.  ?Chest:  ?   Chest wall: No tenderness.  ?  Abdominal:  ?   General: Bowel sounds are normal.  ?   Palpations: Abdomen is soft.  ?Musculoskeletal:     ?   General: Normal range of motion.  ?   Right lower leg: No edema.  ?   Left lower leg: No edema.  ?Neurological:  ?   Mental Status: She is alert and oriented to person, place, and time.  ?   Deep Tendon Reflexes: Reflexes are normal and symmetric.  ?Psychiatric:     ?   Mood and Affect: Mood normal.     ?   Behavior: Behavior normal.     ?   Thought Content: Thought content normal.  ?  ?Results for orders placed or performed in visit on 09/30/21  ?Hemoglobin A1c  ?Result Value Ref Range  ? Hgb A1c MFr Bld 7.3 (H) 4.6 - 6.5 %  ?Lipid panel  ?Result Value Ref Range  ? Cholesterol 138 0 - 200 mg/dL  ? Triglycerides 117.0 0.0 - 149.0 mg/dL  ? HDL 41.90 >39.00 mg/dL  ? VLDL 23.4 0.0 - 40.0 mg/dL  ? LDL Cholesterol 73 0 - 99 mg/dL  ? Total CHOL/HDL Ratio 3   ? NonHDL 96.51   ?Basic metabolic panel  ?Result Value Ref Range  ? Sodium 141 135 - 145 mEq/L  ? Potassium 3.6 3.5 - 5.1 mEq/L  ? Chloride 103 96 - 112 mEq/L  ? CO2 29 19 - 32 mEq/L  ? Glucose, Bld 123 (H) 70 - 99 mg/dL  ? BUN 16 6 - 23 mg/dL  ? Creatinine, Ser 0.81 0.40 - 1.20 mg/dL  ? GFR 84.49 >60.00 mL/min  ? Calcium 9.4 8.4 - 10.5 mg/dL  ?Hepatic function panel  ?Result Value Ref Range  ? Total Bilirubin 0.9 0.2 - 1.2 mg/dL  ? Bilirubin, Direct 0.2 0.0 - 0.3 mg/dL  ? Alkaline Phosphatase 70 39 - 117 U/L  ? AST 22 0 - 37 U/L  ? ALT 30 0 - 35 U/L  ? Total Protein 7.2 6.0 - 8.3 g/dL  ? Albumin 4.2 3.5 - 5.2  g/dL  ?CBC  ?Result Value Ref Range  ? WBC 6.2 4.0 - 10.5 K/uL  ? RBC 5.21 (H) 3.87 - 5.11 Mil/uL  ? Platelets 245.0 150.0 - 400.0 K/uL  ? Hemoglobin 12.4 12.0 - 15.0 g/dL  ? HCT 38.4 36.0 - 46.0 %  ? MCV 73.7 (L

## 2021-09-30 NOTE — Assessment & Plan Note (Addendum)
Reports she was not consistent with medications nor diet in last 22month. This was due to moving homes. ?No HA or CP or dizziness ?Chronic LE edema: improves with use of compressions stocking and exercise ?BP Readings from Last 3 Encounters:  ?09/30/21 130/90  ?04/22/21 136/84  ?12/09/20 122/76  ? ?Repeat CMP: normal ?Maintain med doses ?Refill sent ?Advised about the importance of med and diet compliance. ?F/up in 360month?

## 2021-09-30 NOTE — Assessment & Plan Note (Addendum)
Fasting glucose: 103 ?Nonfasting: 153, 189 ?Exercise: started walking daily, 20-41mns ?Diet: working on improving ?No adverse effects with metfomin ?Up to date with eye exam and dental cleaning. ?Normal foot exam ? ?Repeat hgbA1c and CMP: stable ?Maintain med dose ?

## 2021-09-30 NOTE — Assessment & Plan Note (Signed)
Repeat cbc: normal 

## 2021-09-30 NOTE — Patient Instructions (Signed)
Go to lab for blood draw ?Sign medical release to get records from ophthalmology ?You will be contacted to schedule appt with GI. ?Maintain daily exercise and heart healthy diet. ? ?Preventive Care 62-51 Years Old, Female ?Preventive care refers to lifestyle choices and visits with your health care provider that can promote health and wellness. Preventive care visits are also called wellness exams. ?What can I expect for my preventive care visit? ?Counseling ?Your health care provider may ask you questions about your: ?Medical history, including: ?Past medical problems. ?Family medical history. ?Pregnancy history. ?Current health, including: ?Menstrual cycle. ?Method of birth control. ?Emotional well-being. ?Home life and relationship well-being. ?Sexual activity and sexual health. ?Lifestyle, including: ?Alcohol, nicotine or tobacco, and drug use. ?Access to firearms. ?Diet, exercise, and sleep habits. ?Work and work Statistician. ?Sunscreen use. ?Safety issues such as seatbelt and bike helmet use. ?Physical exam ?Your health care provider will check your: ?Height and weight. These may be used to calculate your BMI (body mass index). BMI is a measurement that tells if you are at a healthy weight. ?Waist circumference. This measures the distance around your waistline. This measurement also tells if you are at a healthy weight and may help predict your risk of certain diseases, such as type 2 diabetes and high blood pressure. ?Heart rate and blood pressure. ?Body temperature. ?Skin for abnormal spots. ?What immunizations do I need? ?Vaccines are usually given at various ages, according to a schedule. Your health care provider will recommend vaccines for you based on your age, medical history, and lifestyle or other factors, such as travel or where you work. ?What tests do I need? ?Screening ?Your health care provider may recommend screening tests for certain conditions. This may include: ?Lipid and cholesterol  levels. ?Diabetes screening. This is done by checking your blood sugar (glucose) after you have not eaten for a while (fasting). ?Pelvic exam and Pap test. ?Hepatitis B test. ?Hepatitis C test. ?HIV (human immunodeficiency virus) test. ?STI (sexually transmitted infection) testing, if you are at risk. ?Lung cancer screening. ?Colorectal cancer screening. ?Mammogram. Talk with your health care provider about when you should start having regular mammograms. This may depend on whether you have a family history of breast cancer. ?BRCA-related cancer screening. This may be done if you have a family history of breast, ovarian, tubal, or peritoneal cancers. ?Bone density scan. This is done to screen for osteoporosis. ?Talk with your health care provider about your test results, treatment options, and if necessary, the need for more tests. ?Follow these instructions at home: ?Eating and drinking ? ?Eat a diet that includes fresh fruits and vegetables, whole grains, lean protein, and low-fat dairy products. ?Take vitamin and mineral supplements as recommended by your health care provider. ?Do not drink alcohol if: ?Your health care provider tells you not to drink. ?You are pregnant, may be pregnant, or are planning to become pregnant. ?If you drink alcohol: ?Limit how much you have to 0-1 drink a day. ?Know how much alcohol is in your drink. In the [place] one drink equals one 12 oz bottle of beer (355 mL), one 5 oz glass of wine (148 mL), or one 1? oz glass of hard liquor (44 mL). ?Lifestyle ?Brush your teeth every morning and night with fluoride toothpaste. Floss one time each day. ?Exercise for at least 30 minutes 5 or more days each week. ?Do not use any products that contain nicotine or tobacco. These products include cigarettes, chewing tobacco, and vaping devices, such as e-cigarettes. If  you need help quitting, ask your health care provider. ?Do not use drugs. ?If you are sexually active, practice safe sex. Use a  condom or other form of protection to prevent STIs. ?If you do not wish to become pregnant, use a form of birth control. If you plan to become pregnant, see your health care provider for a prepregnancy visit. ?Take aspirin only as told by your health care provider. Make sure that you understand how much to take and what form to take. Work with your health care provider to find out whether it is safe and beneficial for you to take aspirin daily. ?Find healthy ways to manage stress, such as: ?Meditation, yoga, or listening to music. ?Journaling. ?Talking to a trusted person. ?Spending time with friends and family. ?Minimize exposure to UV radiation to reduce your risk of skin cancer. ?Safety ?Always wear your seat belt while driving or riding in a vehicle. ?Do not drive: ?If you have been drinking alcohol. Do not ride with someone who has been drinking. ?When you are tired or distracted. ?While texting. ?If you have been using any mind-altering substances or drugs. ?Wear a helmet and other protective equipment during sports activities. ?If you have firearms in your house, make sure you follow all gun safety procedures. ?Seek help if you have been physically or sexually abused. ?What's next? ?Visit your health care provider once a year for an annual wellness visit. ?Ask your health care provider how often you should have your eyes and teeth checked. ?Stay up to date on all vaccines. ?This information is not intended to replace advice given to you by your health care provider. Make sure you discuss any questions you have with your health care provider. ?Document Revised: 12/22/2020 Document Reviewed: 12/22/2020 ?Elsevier Patient Education ? [place]. ? ?

## 2021-09-30 NOTE — Assessment & Plan Note (Addendum)
Repeat lipid panel: improved LDL. ASCVD risk at 8.9% ?Continue atorvastatin dose ?

## 2021-10-04 ENCOUNTER — Encounter: Payer: Self-pay | Admitting: Nurse Practitioner

## 2021-10-05 LAB — CYTOLOGY - PAP
Comment: NEGATIVE
Diagnosis: NEGATIVE
Diagnosis: REACTIVE
High risk HPV: NEGATIVE

## 2021-10-07 ENCOUNTER — Other Ambulatory Visit (HOSPITAL_COMMUNITY): Payer: Self-pay

## 2021-10-07 ENCOUNTER — Telehealth: Payer: Self-pay | Admitting: *Deleted

## 2021-10-07 MED ORDER — ESTRADIOL 10 MCG VA TABS
1.0000 | ORAL_TABLET | VAGINAL | 3 refills | Status: DC
  Filled 2021-10-07: qty 24, 84d supply, fill #0
  Filled 2021-12-18: qty 8, 28d supply, fill #0

## 2021-10-07 NOTE — Telephone Encounter (Signed)
Called patient and informed her of the lab results.  ?

## 2021-10-07 NOTE — Telephone Encounter (Signed)
-----   Message from Kerry Dory, NP sent at 10/05/2021  7:56 AM EDT ----- ?Pap, normal. Repeat 3 years, AEX 1 year, however pap does show atrophy. Since she is just recently menopausal I would recommend she begin Yuvafem twice weekly to prevent worsening atrophy and vaginal dryness. ?

## 2021-10-07 NOTE — Telephone Encounter (Signed)
Patient informed. Rx sent 

## 2021-10-10 ENCOUNTER — Other Ambulatory Visit (HOSPITAL_COMMUNITY): Payer: Self-pay

## 2021-11-21 ENCOUNTER — Encounter: Payer: Self-pay | Admitting: Nurse Practitioner

## 2021-11-24 ENCOUNTER — Ambulatory Visit: Payer: 59 | Admitting: Nurse Practitioner

## 2021-11-24 ENCOUNTER — Encounter: Payer: Self-pay | Admitting: Nurse Practitioner

## 2021-11-24 VITALS — BP 138/84 | HR 70 | Temp 97.3°F | Wt 241.6 lb

## 2021-11-24 DIAGNOSIS — H6122 Impacted cerumen, left ear: Secondary | ICD-10-CM | POA: Diagnosis not present

## 2021-11-24 DIAGNOSIS — E1165 Type 2 diabetes mellitus with hyperglycemia: Secondary | ICD-10-CM

## 2021-11-24 NOTE — Patient Instructions (Signed)

## 2021-11-24 NOTE — Addendum Note (Signed)
Addended by: Lynnea Ferrier on: 11/24/2021 03:54 PM   Modules accepted: Orders

## 2021-11-24 NOTE — Progress Notes (Signed)
Established Patient Visit  Patient: Christina Schwartz   DOB: 1971/03/10   51 y.o. Female  MRN: [id number] Visit Date: 11/24/2021  Subjective:    Chief Complaint  Patient presents with   Ear Pain    Left ear pain x 2 weeks. She states that her hearing is muffled and her ear has been itching as well.    Ear Fullness  There is pain in the left ear. This is a new problem. The current episode started in the past 7 days. The problem occurs constantly. The problem has been unchanged. There has been no fever. The patient is experiencing no pain. Associated symptoms include hearing loss. Pertinent negatives include no abdominal pain, coughing, diarrhea, ear discharge, headaches, neck pain, rash, rhinorrhea, sore throat or vomiting. She has tried nothing for the symptoms. Her past medical history is significant for a chronic ear infection. There is no history of hearing loss or a tympanostomy tube.   Reviewed medical, surgical, and social history today  Medications: Outpatient Medications Prior to Visit  Medication Sig   atenolol (TENORMIN) 50 MG tablet Take 1 tablet (50 mg total) by mouth daily.   atorvastatin (LIPITOR) 20 MG tablet Take 1 tablet (20 mg total) by mouth daily.   Blood Glucose Monitoring Suppl (FREESTYLE LITE) DEVI    Estradiol 10 MCG TABS vaginal tablet Place 1 tablet (10 mcg total) vaginally 2 (two) times a week.   FREESTYLE LITE test strip Check glucose once a day in the morning   hydrochlorothiazide (MICROZIDE) 12.5 MG capsule Take 1 capsule (12.5 mg total) by mouth daily as needed.   Iron-Vitamins (GERITOL PO) Take 1 tablet by mouth daily.   Lancets (FREESTYLE) lancets    metFORMIN (GLUCOPHAGE-XR) 750 MG 24 hr tablet Take 1 tablet (750 mg total) by mouth daily with breakfast.   No facility-administered medications prior to visit.   Reviewed past medical and social history.   ROS per HPI above      Objective:  BP 138/84 (BP Location: Left Arm, Patient  Position: Sitting, Cuff Size: Large)   Pulse 70   Temp (!) 97.3 F (36.3 C) (Temporal)   Wt 241 lb 9.6 oz (109.6 kg)   LMP 07/24/2020   SpO2 97%   BMI 39.59 kg/m      Physical Exam HENT:     Head: Normocephalic.     Jaw: There is normal jaw occlusion.     Salivary Glands: Left salivary gland is not diffusely enlarged or tender.     Right Ear: Hearing, tympanic membrane, ear canal and external ear normal.     Left Ear: External ear normal. There is impacted cerumen.     Ears:     Comments: Procedure Note :    Procedure :  cerumen removal (left)  Indication:  Cerumen impaction (left)  Risks, including pain, dizziness, eardrum perforation, bleeding, infection and others as well as benefits were explained to the patient in detail. Verbal consent was obtained and the patient agreed to proceed.   We used manual wax removal with an ear loop. A large amount wax was recovered. Procedure did not require any irrigation. Tolerated well. Complications: None.  Normal left ear canal and TM post cerumen removal      Nose: Nose normal.  Neurological:     Mental Status: She is alert.    No results found for any visits on 11/24/21.  Assessment & Plan:    Problem List Items Addressed This Visit       Endocrine   Type 2 diabetes mellitus with hyperglycemia, without long-term current use of insulin ([place])   Relevant Orders   Microalbumin / creatinine urine ratio   Other Visit Diagnoses     Impacted cerumen of left ear    -  Primary      Return if symptoms worsen or fail to improve.     Wilfred Lacy, NP

## 2021-11-25 ENCOUNTER — Ambulatory Visit: Payer: 59 | Admitting: Nurse Practitioner

## 2021-11-25 LAB — MICROALBUMIN / CREATININE URINE RATIO
Creatinine, Urine: 194 mg/dL (ref 20–275)
Microalb Creat Ratio: 16 mcg/mg creat (ref <30)
Microalb, Ur: 3.1 mg/dL

## 2021-11-29 DIAGNOSIS — G4733 Obstructive sleep apnea (adult) (pediatric): Secondary | ICD-10-CM | POA: Diagnosis not present

## 2021-12-07 ENCOUNTER — Encounter: Payer: Self-pay | Admitting: Family Medicine

## 2021-12-07 ENCOUNTER — Ambulatory Visit (INDEPENDENT_AMBULATORY_CARE_PROVIDER_SITE_OTHER): Payer: 59 | Admitting: Family Medicine

## 2021-12-07 VITALS — BP 142/92 | HR 72 | Ht 65.5 in | Wt 243.5 lb

## 2021-12-07 DIAGNOSIS — G4733 Obstructive sleep apnea (adult) (pediatric): Secondary | ICD-10-CM | POA: Diagnosis not present

## 2021-12-07 DIAGNOSIS — Z9989 Dependence on other enabling machines and devices: Secondary | ICD-10-CM | POA: Diagnosis not present

## 2021-12-07 NOTE — Progress Notes (Signed)
CM sent to [facility] for new order ?

## 2021-12-07 NOTE — Progress Notes (Signed)
PATIENT: Christina Schwartz DOB: [birth year]  REASON FOR VISIT: follow up HISTORY FROM: patient  Chief Complaint  Patient presents with   Obstructive Sleep Apnea    Rm 11, alone. Here for yearly CPAP f/u. Pt reports doing well.      HISTORY OF PRESENT ILLNESS:  12/07/2021 ALL: [name] returns for follow up for OSA on CPAP. She is doing well on therapy. She is using CPAP nightly for about 7 hours. She denies concerns with machine or supplies. She does not like dealing with DME. She orders supplies every 3 months due to concerns of over billing.     12/07/2020 ALL:  She returns for follow up for OSA on CPAP. She continues to do very well on CPAP. She is using CPAP nightly and more than 4 hours every night. She does continue to note benefit with CPAP. She denies concerns with CPAP machine or supplies.      07/30/2019 ALL:  Christina Schwartz is a 51 y.o. female here today for follow up for OSA on CPAP.  Sleep study revealed severe OSA with AHI of 47.5/h and REM AHI of 78/h. O2 nadir of 78%.  She returns today for her initial CPAP review.  She reports that she is doing very well on CPAP therapy.  She denies any difficulty with her machine.  She is currently using a fullface mask but is interested in trying nasal pillows.  No difficulty with current mask but that she might prefer a nasal pillow.  She has noted significant improvements in sleep quality and daytime energy levels.  Compliance report dated 06/29/2019 through 07/28/2019 reveals that she used CPAP 30 out of the last 30 days for compliance of 100%.  28 of the last 30 days she used CPAP greater than 4 hours for compliance of 93%.  Average usage was 6 hours and 49 minutes.  Residual AHI was 3.5 on a set pressure of 8 cm of water and an EPR of 3.  There was no significant leak noted.  HISTORY: (copied from Dr Guadelupe Sabin note on 02/13/2019)  Dear Dr. Holly Bodily,    I saw your patient, Christina Schwartz, upon your kind request in my sleep clinic  today for initial consultation of her sleep disturbance, in particular, concern for underlying obstructive sleep apnea.  The patient is unaccompanied today.  As you know, [name] is a 51 year old right-handed woman with an underlying medical history of hypertension, diabetes, hypertriglyceridemia, anemia and obesity, who reports snoring and excessive daytime somnolence.  She has a family history of sleep apnea.  I reviewed your office note from 02/05/2019. Her Epworth sleepiness score is 13 out of 24, fatigue severity score is 48 out of 63.  She is typically in bed between 930 and 10 and rise time is around 6.  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  She lives alone and for grown children.  She has no pets in the household.  Recently when she stayed at her mother's house, her sister-in-law noticed apneas while patient was asleep.  They shared a room at the time.  Her brother has sleep apnea and uses a CPAP machine as well as her sister and she also reports that her father had sleep apnea, he passed away at age 60.  She has rare morning headaches, no night to night nocturia.  She denies any telltale symptoms of restless leg syndrome.  She would be willing to get tested for  sleep apnea and consider CPAP therapy.  She is trying to lose weight through weight watchers and has lost 5 pounds thus far.  She is a non-smoker and does not drink alcohol and drinks caffeine and limitation, occasional soda, no daily coffee or tea.    REVIEW OF SYSTEMS: Out of a complete 14 system review of symptoms, the patient complains only of the following symptoms, none and all other reviewed systems are negative.  Epworth sleepiness scale: 7/24  ALLERGIES: Allergies  Allergen Reactions   Amlodipine Other (See Comments)    Headache and dizziness   Lisinopril Cough    HOME MEDICATIONS: Outpatient Medications Prior to Visit  Medication Sig Dispense Refill   atenolol (TENORMIN)  50 MG tablet Take 1 tablet (50 mg total) by mouth daily. 90 tablet 3   atorvastatin (LIPITOR) 20 MG tablet Take 1 tablet (20 mg total) by mouth daily. 90 tablet 3   Blood Glucose Monitoring Suppl (FREESTYLE LITE) DEVI      Estradiol 10 MCG TABS vaginal tablet Place 1 tablet (10 mcg total) vaginally 2 (two) times a week. 24 tablet 3   FREESTYLE LITE test strip Check glucose once a day in the morning 100 each 3   hydrochlorothiazide (MICROZIDE) 12.5 MG capsule Take 1 capsule (12.5 mg total) by mouth daily as needed. 90 capsule 1   Iron-Vitamins (GERITOL PO) Take 1 tablet by mouth daily.     Lancets (FREESTYLE) lancets      metFORMIN (GLUCOPHAGE-XR) 750 MG 24 hr tablet Take 1 tablet (750 mg total) by mouth daily with breakfast. 90 tablet 3   No facility-administered medications prior to visit.    PAST MEDICAL HISTORY: Past Medical History:  Diagnosis Date   Anemia    Heart murmur    Hypertension     PAST SURGICAL HISTORY: Past Surgical History:  Procedure Laterality Date   TUBAL LIGATION     WISDOM TOOTH EXTRACTION      FAMILY HISTORY: Family History  Problem Relation Age of Onset   Hypertension Father    Pulmonary embolism Father    Diabetes Maternal Grandmother    Diabetes Sister    Lymphoma Sister     SOCIAL HISTORY: Social History   Socioeconomic History   Marital status: Legally Separated    Spouse name: Christina Schwartz   Number of children: 4   Years of education: some college   Highest education level: Not on file  Occupational History   Occupation: environmental services    Employer: [employer]  Tobacco Use   Smoking status: Never   Smokeless tobacco: Never  Vaping Use   Vaping Use: Never used  Substance and Sexual Activity   Alcohol use: No   Drug use: No   Sexual activity: Not Currently    Partners: Male    Birth control/protection: Post-menopausal    Comment: BTL  Other Topics Concern   Not on file  Social History Narrative   Lives with 4 children.       No gums in home.   Wears seatbelt.   Social Determinants of Health   Financial Resource Strain: Not on file  Food Insecurity: Not on file  Transportation Needs: Not on file  Physical Activity: Not on file  Stress: Not on file  Social Connections: Not on file  Intimate Partner Violence: Not on file      PHYSICAL EXAM  Vitals:   12/07/21 1013  BP: (!) 142/92  Pulse: 72  Weight: 243 lb 8 oz (110.5  kg)  Height: 5' 5.5" (1.664 m)    Body mass index is 39.9 kg/m.  Generalized: Well developed, in no acute distress  Cardiology: normal rate and rhythm, no murmur noted Respiratory: Clear to auscultation bilaterally Neurological examination  Mentation: Alert oriented to time, place, history taking. Follows all commands speech and language fluent Cranial nerve II-XII: Pupils were equal round reactive to light. Extraocular movements were full, visual field were full  Motor: The motor testing reveals 5 over 5 strength of all 4 extremities. Good symmetric motor tone is noted throughout.  Gait and station: Gait is normal.   DIAGNOSTIC DATA (LABS, IMAGING, TESTING) - I reviewed patient records, labs, notes, testing and imaging myself where available.      View : No data to display.           Lab Results  Component Value Date   WBC 6.2 09/30/2021   HGB 12.4 09/30/2021   HCT 38.4 09/30/2021   MCV 73.7 (L) 09/30/2021   PLT 245.0 09/30/2021      Component Value Date/Time   NA 141 09/30/2021 0830   NA 139 09/09/2020 1354   K 3.6 09/30/2021 0830   CL 103 09/30/2021 0830   CO2 29 09/30/2021 0830   GLUCOSE 123 (H) 09/30/2021 0830   BUN 16 09/30/2021 0830   BUN 16 09/09/2020 1354   CREATININE 0.81 09/30/2021 0830   CALCIUM 9.4 09/30/2021 0830   PROT 7.2 09/30/2021 0830   PROT 7.0 09/09/2020 1354   ALBUMIN 4.2 09/30/2021 0830   ALBUMIN 4.0 09/09/2020 1354   AST 22 09/30/2021 0830   ALT 30 09/30/2021 0830   ALKPHOS 70 09/30/2021 0830   BILITOT 0.9 09/30/2021 0830    BILITOT 0.5 09/09/2020 1354   GFRNONAA 85 07/14/2019 1621   GFRAA 98 07/14/2019 1621   Lab Results  Component Value Date   CHOL 138 09/30/2021   HDL 41.90 09/30/2021   LDLCALC 73 09/30/2021   TRIG 117.0 09/30/2021   CHOLHDL 3 09/30/2021   Lab Results  Component Value Date   HGBA1C 7.3 (H) 09/30/2021   Lab Results  Component Value Date   TDDUKGUR42 706 05/31/2018   Lab Results  Component Value Date   TSH 0.849 11/27/2017       ASSESSMENT AND PLAN 51 y.o. year old female  has a past medical history of Anemia, Heart murmur, and Hypertension. here with     ICD-10-CM   1. OSA on CPAP  G47.33 For home use only DME continuous positive airway pressure (CPAP)   Z99.89        Gabrianna was doing very well on CPAP therapy. Compliance report reveals excellent compliance.  She was encouraged to continue using CPAP nightly and for greater than 4 hours each night.  We will send a new order today for supplies  She was encouraged to continue healthy lifestyle habits. She will follow-up with me in 1 year, sooner if needed.  She verbalizes understanding and agreement with this plan.    Orders Placed This Encounter  Procedures   For home use only DME continuous positive airway pressure (CPAP)    Supplies    Order Specific Question:   Length of Need    Answer:   Lifetime    Order Specific Question:   Patient has OSA or probable OSA    Answer:   Yes    Order Specific Question:   Is the patient currently using CPAP in the home    Answer:  Yes    Order Specific Question:   Settings    Answer:   Other see comments    Order Specific Question:   CPAP supplies needed    Answer:   Mask, headgear, cushions, filters, heated tubing and water chamber     No orders of the defined types were placed in this encounter.      Debbora Presto, FNP-C 12/07/2021, 10:46 AM [facility] [address], [place], [place] [address] [phone]

## 2021-12-07 NOTE — Patient Instructions (Signed)

## 2021-12-19 ENCOUNTER — Other Ambulatory Visit (HOSPITAL_COMMUNITY): Payer: Self-pay

## 2022-01-06 ENCOUNTER — Ambulatory Visit: Payer: 59 | Admitting: Nurse Practitioner

## 2022-03-06 DIAGNOSIS — G4733 Obstructive sleep apnea (adult) (pediatric): Secondary | ICD-10-CM | POA: Diagnosis not present

## 2022-04-19 ENCOUNTER — Other Ambulatory Visit (HOSPITAL_COMMUNITY): Payer: Self-pay

## 2022-04-26 ENCOUNTER — Other Ambulatory Visit (HOSPITAL_COMMUNITY): Payer: Self-pay

## 2022-05-05 ENCOUNTER — Other Ambulatory Visit (INDEPENDENT_AMBULATORY_CARE_PROVIDER_SITE_OTHER): Payer: 59

## 2022-05-05 DIAGNOSIS — Z23 Encounter for immunization: Secondary | ICD-10-CM

## 2022-05-08 IMAGING — MG MM DIGITAL SCREENING BILAT W/ TOMO AND CAD
8 series · 8 of 24 positions shown · non-contrast
Comparison: Previous exam(s).

CLINICAL DATA: Screening.

EXAM:
DIGITAL SCREENING BILATERAL MAMMOGRAM WITH TOMOSYNTHESIS AND CAD
TECHNIQUE: Bilateral screening digital craniocaudal and mediolateral oblique
mammograms were obtained. Bilateral screening digital breast
tomosynthesis was performed. The images were evaluated with
computer-aided detection.

[L CC synth-2D]
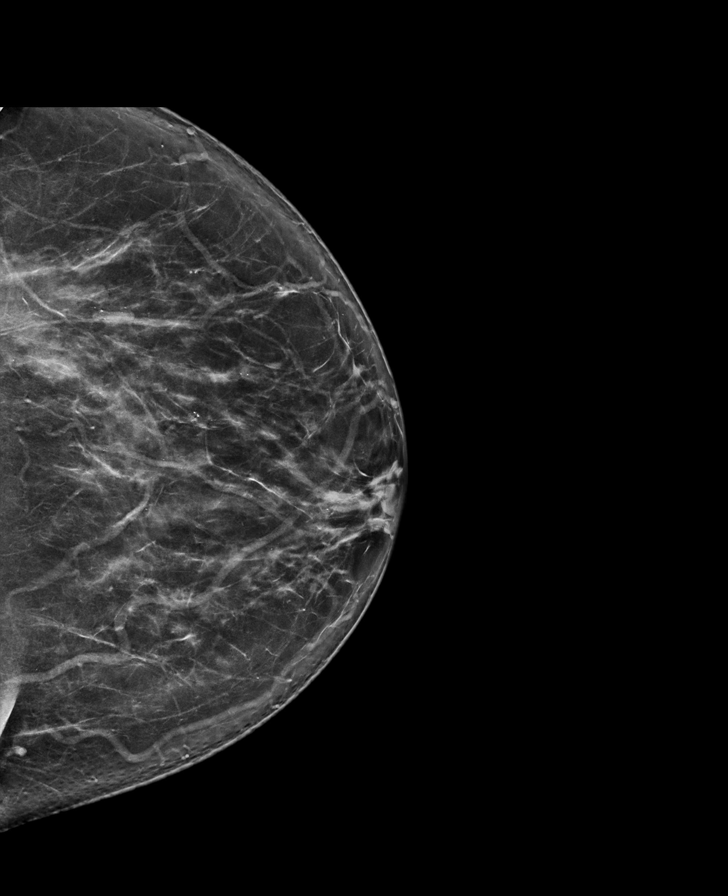

[L MLO synth-2D]
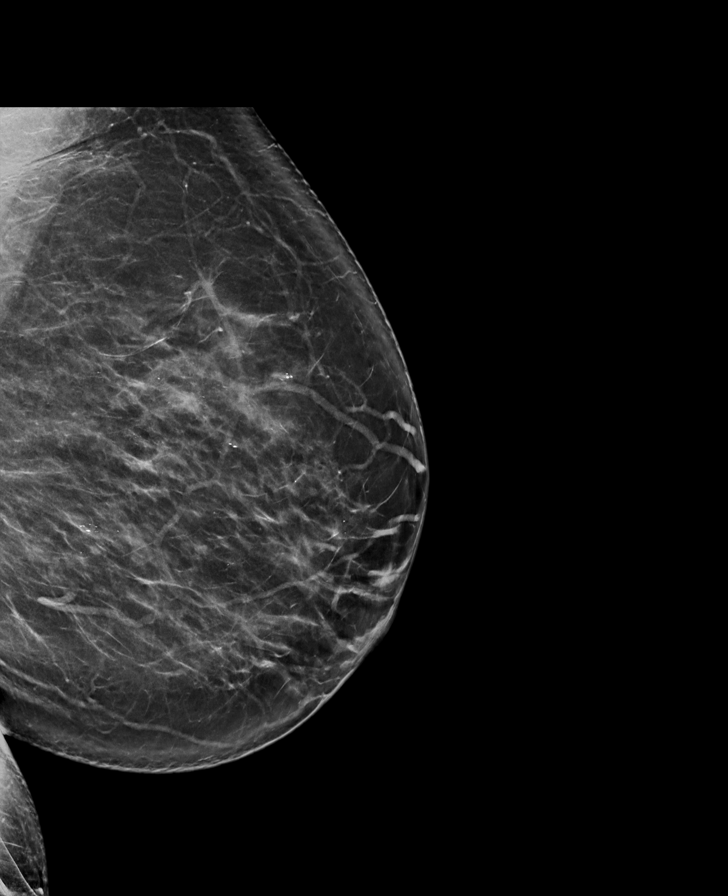

[R MLO synth-2D]
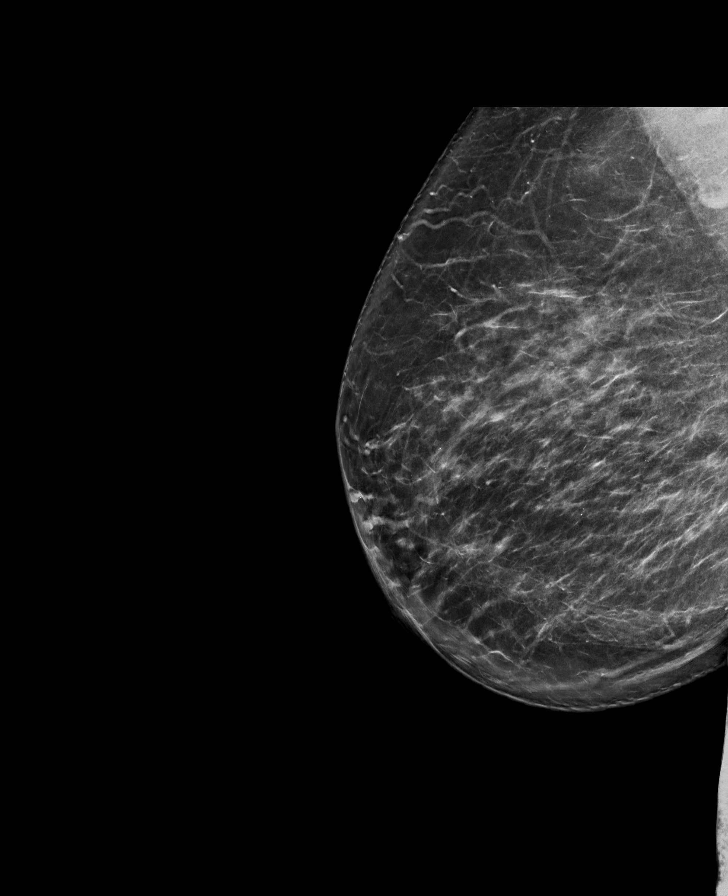

[R CC synth-2D]
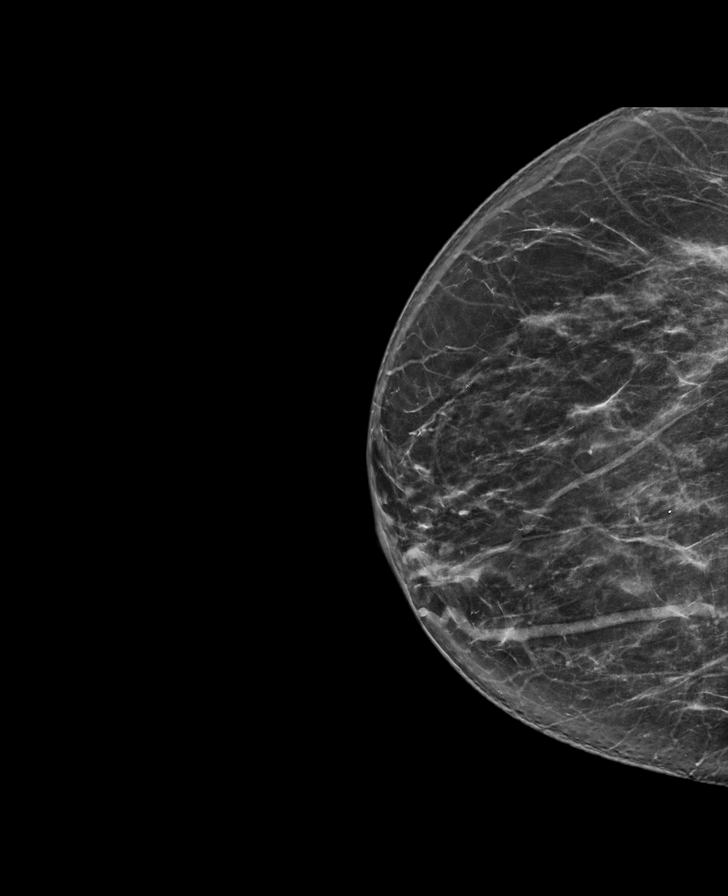

[L MLO tomo · tomo slice 45/90.0]
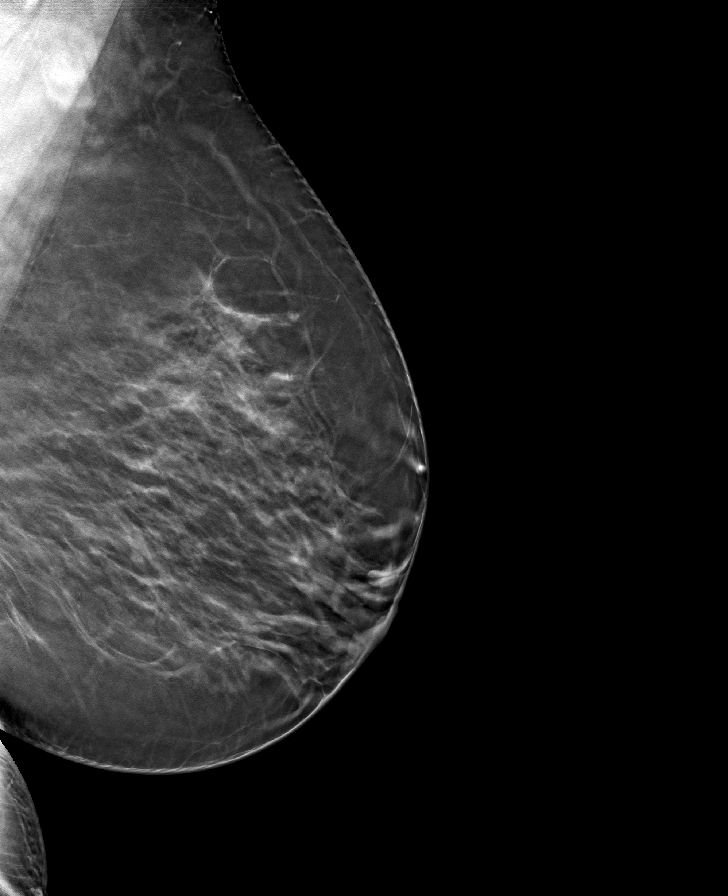

[R MLO tomo · tomo slice 44/87.0]
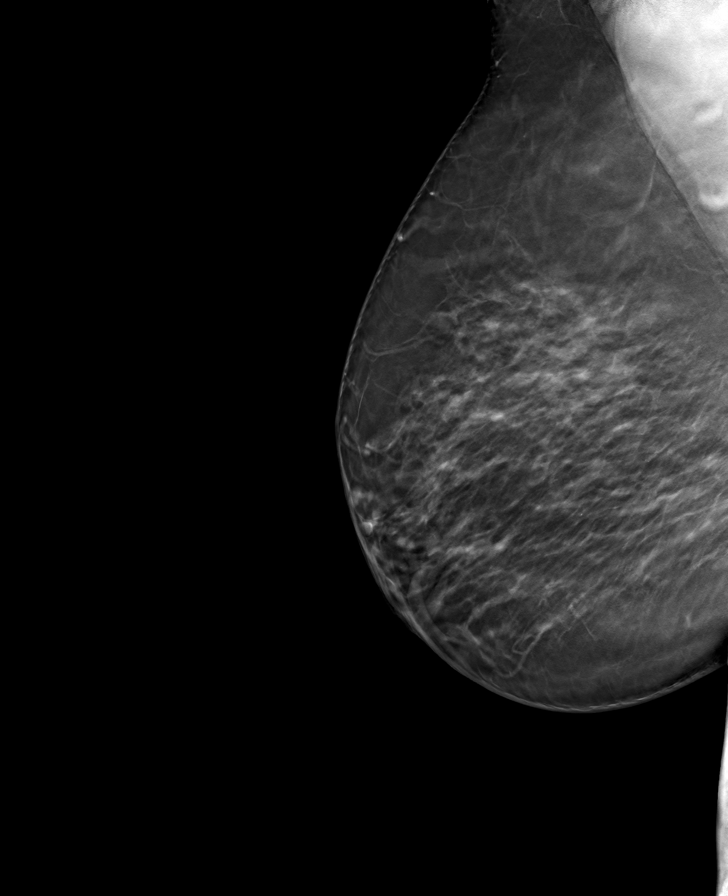

[R CC tomo · tomo slice 37/74.0]
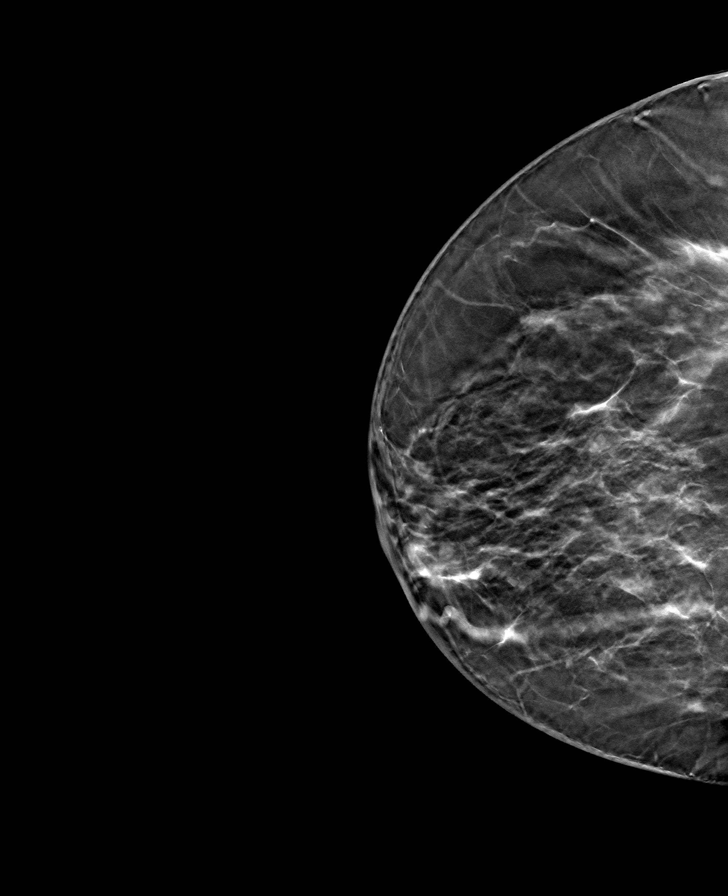

[L CC tomo · tomo slice 42/83.0]
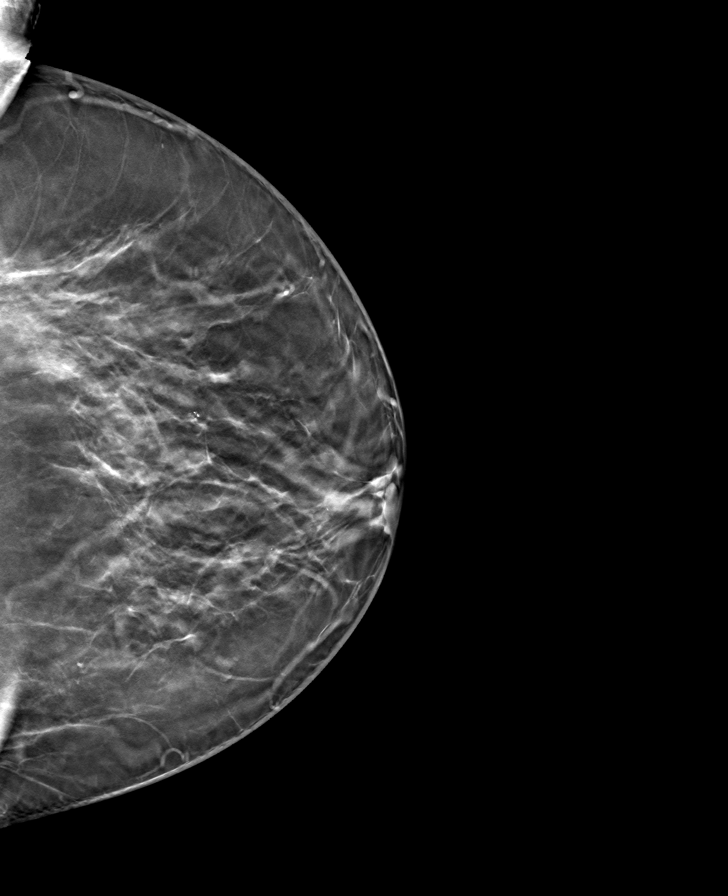

[8 of 24 positions shown; findings below may reference images not displayed]

ACR Breast Density Category b: There are scattered areas of
fibroglandular density.
FINDINGS: There are no findings suspicious for malignancy.
IMPRESSION: No mammographic evidence of malignancy. A result letter of this
screening mammogram will be mailed directly to the patient.

RECOMMENDATION:
Screening mammogram in one year. (Code:[id number])

BI-RADS CATEGORY  1: Negative.

## 2022-06-23 DIAGNOSIS — G4733 Obstructive sleep apnea (adult) (pediatric): Secondary | ICD-10-CM | POA: Diagnosis not present

## 2022-06-27 ENCOUNTER — Encounter: Payer: 59 | Admitting: Nurse Practitioner

## 2022-07-01 DIAGNOSIS — N201 Calculus of ureter: Secondary | ICD-10-CM | POA: Diagnosis not present

## 2022-07-01 DIAGNOSIS — R109 Unspecified abdominal pain: Secondary | ICD-10-CM | POA: Diagnosis not present

## 2022-07-01 DIAGNOSIS — E119 Type 2 diabetes mellitus without complications: Secondary | ICD-10-CM | POA: Diagnosis not present

## 2022-07-01 DIAGNOSIS — N202 Calculus of kidney with calculus of ureter: Secondary | ICD-10-CM | POA: Diagnosis not present

## 2022-07-01 DIAGNOSIS — J9811 Atelectasis: Secondary | ICD-10-CM | POA: Diagnosis not present

## 2022-07-01 DIAGNOSIS — R7402 Elevation of levels of lactic acid dehydrogenase (LDH): Secondary | ICD-10-CM | POA: Diagnosis not present

## 2022-07-01 DIAGNOSIS — E876 Hypokalemia: Secondary | ICD-10-CM | POA: Diagnosis not present

## 2022-07-01 DIAGNOSIS — D259 Leiomyoma of uterus, unspecified: Secondary | ICD-10-CM | POA: Diagnosis not present

## 2022-07-01 DIAGNOSIS — R918 Other nonspecific abnormal finding of lung field: Secondary | ICD-10-CM | POA: Diagnosis not present

## 2022-07-01 DIAGNOSIS — I10 Essential (primary) hypertension: Secondary | ICD-10-CM | POA: Diagnosis not present

## 2022-07-01 DIAGNOSIS — K8020 Calculus of gallbladder without cholecystitis without obstruction: Secondary | ICD-10-CM | POA: Diagnosis not present

## 2022-07-01 DIAGNOSIS — N132 Hydronephrosis with renal and ureteral calculous obstruction: Secondary | ICD-10-CM | POA: Diagnosis not present

## 2022-07-02 DIAGNOSIS — I10 Essential (primary) hypertension: Secondary | ICD-10-CM | POA: Diagnosis not present

## 2022-07-02 DIAGNOSIS — E860 Dehydration: Secondary | ICD-10-CM | POA: Diagnosis not present

## 2022-07-02 DIAGNOSIS — E119 Type 2 diabetes mellitus without complications: Secondary | ICD-10-CM | POA: Diagnosis not present

## 2022-07-02 DIAGNOSIS — N201 Calculus of ureter: Secondary | ICD-10-CM | POA: Diagnosis not present

## 2022-07-02 DIAGNOSIS — N179 Acute kidney failure, unspecified: Secondary | ICD-10-CM | POA: Diagnosis not present

## 2022-07-02 DIAGNOSIS — R112 Nausea with vomiting, unspecified: Secondary | ICD-10-CM | POA: Diagnosis not present

## 2022-07-19 ENCOUNTER — Other Ambulatory Visit (HOSPITAL_COMMUNITY): Payer: Self-pay

## 2022-07-19 ENCOUNTER — Ambulatory Visit (INDEPENDENT_AMBULATORY_CARE_PROVIDER_SITE_OTHER): Payer: 59 | Admitting: Family Medicine

## 2022-07-19 ENCOUNTER — Ambulatory Visit: Payer: 59

## 2022-07-19 ENCOUNTER — Encounter: Payer: Self-pay | Admitting: Family Medicine

## 2022-07-19 VITALS — BP 138/86 | HR 71 | Temp 97.5°F | Ht 65.5 in | Wt 248.0 lb

## 2022-07-19 DIAGNOSIS — R42 Dizziness and giddiness: Secondary | ICD-10-CM | POA: Diagnosis not present

## 2022-07-19 MED ORDER — MECLIZINE HCL 25 MG PO TABS
25.0000 mg | ORAL_TABLET | Freq: Three times a day (TID) | ORAL | 0 refills | Status: DC | PRN
  Filled 2022-07-19: qty 30, 10d supply, fill #0

## 2022-07-19 NOTE — Patient Instructions (Signed)
Vertigo Vertigo is the feeling that you or your surroundings are moving when they are not. This feeling can come and go at any time. Vertigo often goes away on its own. Vertigo can be dangerous if it occurs while you are doing something that could endanger yourself or others, such as driving or operating machinery. Your health care provider will do tests to try to determine the cause of your vertigo. Tests will also help your health care provider decide how best to treat your condition. Follow these instructions at home: Eating and drinking     Dehydration can make vertigo worse. Drink enough fluid to keep your urine pale yellow. Do not drink alcohol. Activity Return to your normal activities as told by your health care provider. Ask your health care provider what activities are safe for you. In the morning, first sit up on the side of the bed. When you feel okay, stand slowly while you hold onto something until you know that your balance is fine. Move slowly. Avoid sudden body or head movements or certain positions, as told by your health care provider. If you have trouble walking or keeping your balance, try using a cane for stability. If you feel dizzy or unstable, sit down right away. Avoid doing any tasks that would cause danger to you or others if vertigo occurs. Avoid bending down if you feel dizzy. Place items in your home so that they are easy for you to reach without bending or leaning over. Do not drive or use machinery if you feel dizzy. General instructions Take over-the-counter and prescription medicines only as told by your health care provider. Keep all follow-up visits. This is important. Contact a health care provider if: Your medicines do not relieve your vertigo or they make it worse. Your condition gets worse or you develop new symptoms. You have a fever. You develop nausea or vomiting, or if nausea gets worse. Your family or friends notice any behavioral changes. You  have numbness or a prickling and tingling sensation in part of your body. Get help right away if you: Are always dizzy or you faint. Develop severe headaches. Develop a stiff neck. Develop sensitivity to light. Have difficulty moving or speaking. Have weakness in your hands, arms, or legs. Have changes in your hearing or vision. These symptoms may represent a serious problem that is an emergency. Do not wait to see if the symptoms will go away. Get medical help right away. Call your local emergency services (911 in the [place] Do not drive yourself to the hospital. Summary Vertigo is the feeling that you or your surroundings are moving when they are not. Your health care provider will do tests to try to determine the cause of your vertigo. Follow instructions for home care. You may be told to avoid certain tasks, positions, or movements. Contact a health care provider if your medicines do not relieve your symptoms, or if you have a fever, nausea, vomiting, or changes in behavior. Get help right away if you have severe headaches or difficulty speaking, or you develop hearing or vision problems. This information is not intended to replace advice given to you by your health care provider. Make sure you discuss any questions you have with your health care provider. Document Revised: 05/26/2020 Document Reviewed: 05/26/2020 Elsevier Patient Education  2023 Elsevier Inc.  

## 2022-07-19 NOTE — Progress Notes (Signed)
[facility] [address] [place] [address] Dept: [phone] Dept Fax: [phone]  Office Visit  Subjective:    Patient ID: Elayne Snare, female    DOB: 18-Sep-1970, 52 y.o..   MRN: [id number]  Chief Complaint  Patient presents with   Acute Visit    C/o haivng some dizziness x 2 days.      History of Present Illness:  Patient is in today with a history since yesterday of dizziness. she describes a sense of vertigo. This started while at work. She had been sitting at her computer for quite a while typing. She notes that when she finally got up, she had onset of vertigo. This continued through the evening and when she lay down in bed. She did not see a difference with position or turning of her head. She has not had any recent cold symptoms. She denies any associated nausea. She has had no hearing loss or tinnitus. She denies any acute weakness or numbness. She took some Benadryl last night, which did help her sleep. The vertigo was improved this morning and is currently resolved.  Past Medical History: Patient Active Problem List   Diagnosis Date Noted   Venous insufficiency 04/22/2021   Type 2 diabetes mellitus with hyperglycemia, without long-term current use of insulin ([place]) 12/09/2020   Sleep apnea 02/05/2019   Class 1 obesity due to excess calories with serious comorbidity and body mass index (BMI) of 34.0 to 34.9 in adult 02/05/2019   Dyslipidemia associated with type 2 diabetes mellitus ([place]) 06/09/2018   Hypertension associated with diabetes ([place]) 08/18/2016   Iron deficiency anemia due to chronic blood loss 08/18/2016   Fibroids 05/08/2012   Menorrhagia 04/15/2012   Past Surgical History:  Procedure Laterality Date   TUBAL LIGATION     WISDOM TOOTH EXTRACTION     Family History  Problem Relation Age of Onset   Hypertension Father    Pulmonary embolism Father    Diabetes Maternal Grandmother     Diabetes Sister    Lymphoma Sister    Outpatient Medications Prior to Visit  Medication Sig Dispense Refill   atenolol (TENORMIN) 50 MG tablet Take 1 tablet (50 mg total) by mouth daily. 90 tablet 3   atorvastatin (LIPITOR) 20 MG tablet Take 1 tablet (20 mg total) by mouth daily. 90 tablet 3   Blood Glucose Monitoring Suppl (FREESTYLE LITE) DEVI      FREESTYLE LITE test strip Check glucose once a day in the morning 100 each 3   hydrochlorothiazide (MICROZIDE) 12.5 MG capsule Take 1 capsule (12.5 mg total) by mouth daily as needed. 90 capsule 1   Iron-Vitamins (GERITOL PO) Take 1 tablet by mouth daily.     Lancets (FREESTYLE) lancets      metFORMIN (GLUCOPHAGE-XR) 750 MG 24 hr tablet Take 1 tablet (750 mg total) by mouth daily with breakfast. 90 tablet 3   Estradiol 10 MCG TABS vaginal tablet Place 1 tablet (10 mcg total) vaginally 2 (two) times a week. 24 tablet 3   No facility-administered medications prior to visit.   Allergies  Allergen Reactions   Amlodipine Other (See Comments)    Headache and dizziness   Lisinopril Cough     Objective:   Today's Vitals   07/19/22 1013  BP: 138/86  Pulse: 71  Temp: (!) 97.5 F (36.4 C)  TempSrc: Temporal  SpO2: 98%  Weight: 248 lb (112.5 kg)  Height: 5' 5.5" (1.664 m)  Body mass index is 40.64 kg/m.   General: Well developed, well nourished. No acute distress. HEENT: Normocephalic, non-traumatic. PERRL, EOMI. Conjunctiva clear. External ears normal. EAC and   TMs normal bilaterally. Nose clear without congestion or rhinorrhea. Mucous membranes moist.   Oropharynx clear. Good dentition. Neck: Supple. No lymphadenopathy. No thyromegaly. Lungs: Clear to auscultation bilaterally. No wheezing, rales or rhonchi. CV: RRR without murmurs or rubs. Pulses 2+ bilaterally. Neuro: CN II-XII intact. Normal sensation and strength. Psych: Alert and oriented. Normal mood and affect.  Health Maintenance Due  Topic Date Due   HIV Screening   Never done   Hepatitis C Screening  Never done   COLONOSCOPY (Pts 45-63yr Insurance coverage will need to be confirmed)  Never done   OPHTHALMOLOGY EXAM  04/22/2020   Zoster Vaccines- Shingrix (1 of 2) Never done   FOOT EXAM  09/09/2021   HEMOGLOBIN A1C  04/02/2022     Assessment & Plan:   1. Vertigo This appears to have been a transient episode of non-positional vertigo. I recommend we observe this for now. I will provide some meclizine, should the symptoms return. She should follow-up if this remains persistent or worsens.  - meclizine (ANTIVERT) 25 MG tablet; Take 1 tablet (25 mg total) by mouth 3 (three) times daily as needed for dizziness.  Dispense: 30 tablet; Refill: 0  Return if symptoms worsen or fail to improve.   SHaydee Salter MD

## 2022-07-28 ENCOUNTER — Ambulatory Visit (INDEPENDENT_AMBULATORY_CARE_PROVIDER_SITE_OTHER): Payer: 59 | Admitting: Nurse Practitioner

## 2022-07-28 ENCOUNTER — Encounter: Payer: Self-pay | Admitting: Nurse Practitioner

## 2022-07-28 ENCOUNTER — Other Ambulatory Visit (HOSPITAL_COMMUNITY): Payer: Self-pay

## 2022-07-28 ENCOUNTER — Ambulatory Visit: Payer: Self-pay | Admitting: Nurse Practitioner

## 2022-07-28 VITALS — BP 136/84 | HR 68 | Temp 96.8°F | Ht 65.5 in | Wt 248.2 lb

## 2022-07-28 DIAGNOSIS — E785 Hyperlipidemia, unspecified: Secondary | ICD-10-CM | POA: Diagnosis not present

## 2022-07-28 DIAGNOSIS — G4730 Sleep apnea, unspecified: Secondary | ICD-10-CM | POA: Diagnosis not present

## 2022-07-28 DIAGNOSIS — D500 Iron deficiency anemia secondary to blood loss (chronic): Secondary | ICD-10-CM

## 2022-07-28 DIAGNOSIS — E1159 Type 2 diabetes mellitus with other circulatory complications: Secondary | ICD-10-CM | POA: Diagnosis not present

## 2022-07-28 DIAGNOSIS — I152 Hypertension secondary to endocrine disorders: Secondary | ICD-10-CM | POA: Diagnosis not present

## 2022-07-28 DIAGNOSIS — E1165 Type 2 diabetes mellitus with hyperglycemia: Secondary | ICD-10-CM | POA: Diagnosis not present

## 2022-07-28 DIAGNOSIS — E1169 Type 2 diabetes mellitus with other specified complication: Secondary | ICD-10-CM | POA: Diagnosis not present

## 2022-07-28 DIAGNOSIS — R911 Solitary pulmonary nodule: Secondary | ICD-10-CM

## 2022-07-28 DIAGNOSIS — Z87442 Personal history of urinary calculi: Secondary | ICD-10-CM | POA: Diagnosis not present

## 2022-07-28 DIAGNOSIS — I872 Venous insufficiency (chronic) (peripheral): Secondary | ICD-10-CM

## 2022-07-28 DIAGNOSIS — Z0000 Encounter for general adult medical examination without abnormal findings: Secondary | ICD-10-CM

## 2022-07-28 LAB — CBC
HCT: 39 % (ref 36.0–46.0)
Hemoglobin: 12.6 g/dL (ref 12.0–15.0)
MCHC: 32.4 g/dL (ref 30.0–36.0)
MCV: 74.2 fl — ABNORMAL LOW (ref 78.0–100.0)
Platelets: 260 10*3/uL (ref 150.0–400.0)
RBC: 5.25 Mil/uL — ABNORMAL HIGH (ref 3.87–5.11)
RDW: 17.8 % — ABNORMAL HIGH (ref 11.5–15.5)
WBC: 6.9 10*3/uL (ref 4.0–10.5)

## 2022-07-28 LAB — POCT URINALYSIS DIPSTICK
Bilirubin, UA: NEGATIVE
Glucose, UA: NEGATIVE
Ketones, UA: NEGATIVE
Nitrite, UA: NEGATIVE
Protein, UA: NEGATIVE
Spec Grav, UA: 1.02 (ref 1.010–1.025)
Urobilinogen, UA: 0.2 E.U./dL
pH, UA: 6 (ref 5.0–8.0)

## 2022-07-28 LAB — LIPID PANEL
Cholesterol: 134 mg/dL (ref 0–200)
HDL: 40.9 mg/dL (ref >39.00)
LDL Cholesterol: 67 mg/dL (ref 0–99)
NonHDL: 93.52
Total CHOL/HDL Ratio: 3
Triglycerides: 135 mg/dL (ref 0.0–149.0)
VLDL: 27 mg/dL (ref 0.0–40.0)

## 2022-07-28 LAB — COMPREHENSIVE METABOLIC PANEL
ALT: 21 U/L (ref 0–35)
AST: 14 U/L (ref 0–37)
Albumin: 4.1 g/dL (ref 3.5–5.2)
Alkaline Phosphatase: 79 U/L (ref 39–117)
BUN: 15 mg/dL (ref 6–23)
CO2: 26 mEq/L (ref 19–32)
Calcium: 9.1 mg/dL (ref 8.4–10.5)
Chloride: 104 mEq/L (ref 96–112)
Creatinine, Ser: 0.75 mg/dL (ref 0.40–1.20)
GFR: 92.13 mL/min (ref >60.00)
Glucose, Bld: 93 mg/dL (ref 70–99)
Potassium: 3.8 mEq/L (ref 3.5–5.1)
Sodium: 139 mEq/L (ref 135–145)
Total Bilirubin: 1 mg/dL (ref 0.2–1.2)
Total Protein: 6.9 g/dL (ref 6.0–8.3)

## 2022-07-28 LAB — HEMOGLOBIN A1C: Hgb A1c MFr Bld: 7.4 % — ABNORMAL HIGH (ref 4.6–6.5)

## 2022-07-28 MED ORDER — ATORVASTATIN CALCIUM 20 MG PO TABS
20.0000 mg | ORAL_TABLET | Freq: Every day | ORAL | 3 refills | Status: DC
  Filled 2022-07-28: qty 90, 90d supply, fill #0

## 2022-07-28 MED ORDER — METFORMIN HCL ER 750 MG PO TB24
750.0000 mg | ORAL_TABLET | Freq: Every day | ORAL | 3 refills | Status: DC
  Filled 2022-07-28 – 2022-08-17 (×2): qty 90, 90d supply, fill #0
  Filled 2022-12-13: qty 90, 90d supply, fill #1

## 2022-07-28 MED ORDER — HYDROCHLOROTHIAZIDE 12.5 MG PO CAPS
12.5000 mg | ORAL_CAPSULE | Freq: Every day | ORAL | 1 refills | Status: DC | PRN
  Filled 2022-07-28 – 2022-08-17 (×2): qty 90, 90d supply, fill #0

## 2022-07-28 MED ORDER — ATENOLOL 50 MG PO TABS
50.0000 mg | ORAL_TABLET | Freq: Every day | ORAL | 3 refills | Status: DC
  Filled 2022-07-28 – 2022-08-17 (×2): qty 90, 90d supply, fill #0
  Filled 2022-12-13: qty 90, 90d supply, fill #1
  Filled 2023-03-25: qty 90, 90d supply, fill #2

## 2022-07-28 NOTE — Assessment & Plan Note (Signed)
Chronic, stable.  Continue HCTZ 12.5 mg daily as needed, wearing compression socks and limiting salt in her diet.

## 2022-07-28 NOTE — Assessment & Plan Note (Signed)
She had a recent history of kidney stones before Christmas which have resolved.  UA in office was normal.  No recurrent pain.  Follow-up if symptoms worsen or with any concerns.

## 2022-07-28 NOTE — Assessment & Plan Note (Addendum)
Chronic, stable.  BP today 136/84.  Continue atenolol 50 mg daily and HCTZ 12.5 mg daily as needed.  Check CMP, CBC, lipid panel today.

## 2022-07-28 NOTE — Progress Notes (Signed)
BP 136/84   Pulse 68   Temp (!) 96.8 F (36 C)   Ht 5' 5.5" (1.664 m)   Wt 248 lb 3.2 oz (112.6 kg)   LMP 07/24/2020   SpO2 96%   BMI 40.67 kg/m    Subjective:    Patient ID: Christina Schwartz, female    DOB: September 03, 1970, 52 y.o.   MRN: [id number]  CC: Chief Complaint  Patient presents with   Annual Exam    Physical when to ED over the holiday they  knots on lungs wanted her to follow up , no shortness of breath, pt is fasting for labs     HPI: Christina Schwartz is a 52 y.o. female presenting on 07/28/2022 for comprehensive medical examination. Current medical complaints include: recent ER visit for kidney stone  December 23 and 24 she went to the ER in [place] for severe flank pain. She was diagnosed with a kidney stone. The CT scan also showed small 5-61m subpleural nodules in the right lower lung, recommend CT in 3-6 months. She states that the pain has resolved and she is not having any blood in her urine. She has not been a smoker in the past and denies shortness of breath and cough.   She currently lives with: 2 sons Menopausal Symptoms:  occasional hot flashes  Depression Screen done today and results listed below:     07/28/2022   10:00 AM 09/30/2021    7:58 AM 12/31/2020    8:05 AM 09/09/2020    1:44 PM 03/16/2020    1:28 PM  Depression screen PHQ 2/9  Decreased Interest 0 0 0 0 0  Down, Depressed, Hopeless 0 0 0 0 0  PHQ - 2 Score 0 0 0 0 0  Altered sleeping 0      Tired, decreased energy 0      Change in appetite 0      Feeling bad or failure about yourself  0      Trouble concentrating 0      Moving slowly or fidgety/restless 0      Suicidal thoughts 0      PHQ-9 Score 0        The patient does not have a history of falls. I did not complete a risk assessment for falls. A plan of care for falls was not documented.   Past Medical History:  Past Medical History:  Diagnosis Date   Anemia    Heart murmur    Hypertension    Kidney stone    Lung nodule     Vertigo     Surgical History:  Past Surgical History:  Procedure Laterality Date   TUBAL LIGATION     WISDOM TOOTH EXTRACTION      Medications:  Current Outpatient Medications on File Prior to Visit  Medication Sig   Blood Glucose Monitoring Suppl (FREESTYLE LITE) DEVI    FREESTYLE LITE test strip Check glucose once a day in the morning   Iron-Vitamins (GERITOL PO) Take 1 tablet by mouth daily.   Lancets (FREESTYLE) lancets    meclizine (ANTIVERT) 25 MG tablet Take 1 tablet (25 mg total) by mouth 3 (three) times daily as needed for dizziness.   No current facility-administered medications on file prior to visit.    Allergies:  Allergies  Allergen Reactions   Amlodipine Other (See Comments)    Headache and dizziness   Lisinopril Cough    Social History:  Social History   Socioeconomic History  Marital status: Legally Separated    Spouse name: bruce   Number of children: 4   Years of education: some college   Highest education level: Not on file  Occupational History   Occupation: environmental services    Employer: [employer]  Tobacco Use   Smoking status: Never   Smokeless tobacco: Never  Vaping Use   Vaping Use: Never used  Substance and Sexual Activity   Alcohol use: No   Drug use: No   Sexual activity: Not Currently    Partners: Male    Birth control/protection: Post-menopausal    Comment: BTL  Other Topics Concern   Not on file  Social History Narrative   Lives with 2 children.      No guns in home.   Wears seatbelt.   Social Determinants of Health   Financial Resource Strain: Low Risk  (11/27/2017)   Overall Financial Resource Strain (CARDIA)    Difficulty of Paying Living Expenses: Not very hard  Food Insecurity: No Food Insecurity (11/27/2017)   Hunger Vital Sign    Worried About Running Out of Food in the Last Year: Never true    Ran Out of Food in the Last Year: Never true  Transportation Needs: No Transportation Needs (11/27/2017)    PRAPARE - Hydrologist (Medical): No    Lack of Transportation (Non-Medical): No  Physical Activity: Not on file  Stress: Not on file  Social Connections: Not on file  Intimate Partner Violence: Not on file   Social History   Tobacco Use  Smoking Status Never  Smokeless Tobacco Never   Social History   Substance and Sexual Activity  Alcohol Use No    Family History:  Family History  Problem Relation Age of Onset   Hypertension Father    Pulmonary embolism Father    Diabetes Maternal Grandmother    Diabetes Sister    Lymphoma Sister     Past medical history, surgical history, medications, allergies, family history and social history reviewed with patient today and changes made to appropriate areas of the chart.   Review of Systems  Constitutional:  Positive for malaise/fatigue. Negative for fever.  HENT: Negative.    Eyes: Negative.   Respiratory: Negative.    Cardiovascular: Negative.   Gastrointestinal: Negative.   Genitourinary: Negative.   Musculoskeletal: Negative.   Skin: Negative.   Neurological:  Positive for headaches (intermittent, not frequent). Negative for dizziness.  Psychiatric/Behavioral: Negative.     All other ROS negative except what is listed above and in the HPI.      Objective:    BP 136/84   Pulse 68   Temp (!) 96.8 F (36 C)   Ht 5' 5.5" (1.664 m)   Wt 248 lb 3.2 oz (112.6 kg)   LMP 07/24/2020   SpO2 96%   BMI 40.67 kg/m   Wt Readings from Last 3 Encounters:  07/28/22 248 lb 3.2 oz (112.6 kg)  07/19/22 248 lb (112.5 kg)  12/07/21 243 lb 8 oz (110.5 kg)    Physical Exam Vitals and nursing note reviewed.  Constitutional:      General: She is not in acute distress.    Appearance: Normal appearance.  HENT:     Head: Normocephalic and atraumatic.     Right Ear: Tympanic membrane, ear canal and external ear normal.     Left Ear: Tympanic membrane, ear canal and external ear normal.  Eyes:      Conjunctiva/sclera: Conjunctivae  normal.  Cardiovascular:     Rate and Rhythm: Normal rate and regular rhythm.     Pulses: Normal pulses.     Heart sounds: Normal heart sounds.  Pulmonary:     Effort: Pulmonary effort is normal.     Breath sounds: Normal breath sounds.  Abdominal:     Palpations: Abdomen is soft.     Tenderness: There is no abdominal tenderness.  Musculoskeletal:        General: Normal range of motion.     Cervical back: Normal range of motion and neck supple.     Right lower leg: No edema.     Left lower leg: No edema.  Lymphadenopathy:     Cervical: No cervical adenopathy.  Skin:    General: Skin is warm and dry.  Neurological:     General: No focal deficit present.     Mental Status: She is alert and oriented to person, place, and time.     Cranial Nerves: No cranial nerve deficit.     Coordination: Coordination normal.     Gait: Gait normal.  Psychiatric:        Mood and Affect: Mood normal.        Behavior: Behavior normal.        Thought Content: Thought content normal.        Judgment: Judgment normal.     Results for orders placed or performed in visit on 07/28/22  POCT urinalysis dipstick  Result Value Ref Range   Color, UA yellow    Clarity, UA clear    Glucose, UA Negative Negative   Bilirubin, UA neg    Ketones, UA neg    Spec Grav, UA 1.020 1.010 - 1.025   Blood, UA trace    pH, UA 6.0 5.0 - 8.0   Protein, UA Negative Negative   Urobilinogen, UA 0.2 0.2 or 1.0 E.U./dL   Nitrite, UA neg    Leukocytes, UA     Appearance     Odor        Assessment & Plan:   Problem List Items Addressed This Visit       Cardiovascular and Mediastinum   Hypertension associated with diabetes (HCC)    Chronic, stable.  BP today 136/84.  Continue atenolol 50 mg daily and HCTZ 12.5 mg daily as needed.  Check CMP, CBC, lipid panel today.      Relevant Medications   atenolol (TENORMIN) 50 MG tablet   atorvastatin (LIPITOR) 20 MG tablet    hydrochlorothiazide (MICROZIDE) 12.5 MG capsule   metFORMIN (GLUCOPHAGE-XR) 750 MG 24 hr tablet   Other Relevant Orders   CBC   Comprehensive metabolic panel   Lipid panel   Venous insufficiency    Chronic, stable.  Continue HCTZ 12.5 mg daily as needed, wearing compression socks and limiting salt in her diet.      Relevant Medications   atenolol (TENORMIN) 50 MG tablet   atorvastatin (LIPITOR) 20 MG tablet   hydrochlorothiazide (MICROZIDE) 12.5 MG capsule     Respiratory   Sleep apnea    Chronic, stable.  Continue wearing CPAP at night.      Pulmonary nodule    CT scan 06/2022 showed small 5-69m subpleural nodules in the right lower lung, recommend CT in 3-6 months. Will place order for follow-up CT scan. She has never smoked in her life.       Relevant Orders   CT CHEST WO CONTRAST     Endocrine  Dyslipidemia associated with type 2 diabetes mellitus (HCC)    Chronic, stable.  Continue atorvastatin 20 mg daily.  Will check CMP, CBC, lipid panel today.      Relevant Medications   atorvastatin (LIPITOR) 20 MG tablet   metFORMIN (GLUCOPHAGE-XR) 750 MG 24 hr tablet   Other Relevant Orders   CBC   Comprehensive metabolic panel   Lipid panel   Type 2 diabetes mellitus with hyperglycemia, without long-term current use of insulin (HCC)    Chronic, stable.  She has been checking her blood sugars at home and they have been in the 1 teens.  Continue metformin XR 750 mg daily with breakfast.  She is up-to-date on her pneumonia vaccine.  She has an eye appointment scheduled next March with [facility].  Foot exam done today was normal.  Will check CMP, CBC, A1c, lipid panel, urine microalbumin.  Follow-up in 3 months.      Relevant Medications   atorvastatin (LIPITOR) 20 MG tablet   metFORMIN (GLUCOPHAGE-XR) 750 MG 24 hr tablet   Other Relevant Orders   CBC   Comprehensive metabolic panel   Lipid panel   Hemoglobin A1c   Microalbumin / creatinine urine ratio     Other    Iron deficiency anemia due to chronic blood loss    She has been taking an iron supplement daily when she remembers.  Will check a CBC and iron panel today.  Adjust regimen based on results.      Relevant Orders   CBC   Iron, TIBC and Ferritin Panel   History of kidney stones    She had a recent history of kidney stones before Christmas which have resolved.  UA in office was normal.  No recurrent pain.  Follow-up if symptoms worsen or with any concerns.      Relevant Orders   POCT urinalysis dipstick (Completed)   Morbid obesity (HCC)    BMI 40.6. Discussed nutrition, exercise.       Relevant Medications   metFORMIN (GLUCOPHAGE-XR) 750 MG 24 hr tablet   Other Visit Diagnoses     Routine general medical examination at a health care facility    -  Primary   Health maintenance reviewed and updated. Discused nutrition, exercise. Check CMP, CBC today. Follow-up 1 year        Follow up plan: Return in about 3 months (around 10/27/2022) for Diabetes, follow-up on CT scan with PCP.   LABORATORY TESTING:  - Pap smear: up to date  IMMUNIZATIONS:   - Tdap: Tetanus vaccination status reviewed: last tetanus booster within 10 years. - Influenza: Up to date - Pneumovax: Up to date - Prevnar: Not applicable - HPV: Not applicable - Zostavax vaccine: Refused  SCREENING: -Mammogram: Up to date  - Colonoscopy:  would like to think about option   - Bone Density: Not applicable  -Hearing Test: Not applicable  -Spirometry: Not applicable   PATIENT COUNSELING:   Advised to take 1 mg of folate supplement per day if capable of pregnancy.   Sexuality: Discussed sexually transmitted diseases, partner selection, use of condoms, avoidance of unintended pregnancy  and contraceptive alternatives.   Advised to avoid cigarette smoking.  I discussed with the patient that most people either abstain from alcohol or drink within safe limits (<=14/week and <=4 drinks/occasion for males, <=7/weeks  and <= 3 drinks/occasion for females) and that the risk for alcohol disorders and other health effects rises proportionally with the number of drinks per week  and how often a drinker exceeds daily limits.  Discussed cessation/primary prevention of drug use and availability of treatment for abuse.   Diet: Encouraged to adjust caloric intake to maintain  or achieve ideal body weight, to reduce intake of dietary saturated fat and total fat, to limit sodium intake by avoiding high sodium foods and not adding table salt, and to maintain adequate dietary potassium and calcium preferably from fresh fruits, vegetables, and low-fat dairy products.    stressed the importance of regular exercise  Injury prevention: Discussed safety belts, safety helmets, smoke detector, smoking near bedding or upholstery.   Dental health: Discussed importance of regular tooth brushing, flossing, and dental visits.    NEXT PREVENTATIVE PHYSICAL DUE IN 1 YEAR. Return in about 3 months (around 10/27/2022) for Diabetes, follow-up on CT scan with PCP.

## 2022-07-28 NOTE — Assessment & Plan Note (Addendum)
CT scan 06/2022 showed small 5-77m subpleural nodules in the right lower lung, recommend CT in 3-6 months. Will place order for follow-up CT scan. She has never smoked in her life.

## 2022-07-28 NOTE — Assessment & Plan Note (Signed)
Chronic, stable.  Continue atorvastatin 20 mg daily.  Will check CMP, CBC, lipid panel today.

## 2022-07-28 NOTE — Assessment & Plan Note (Signed)
Chronic, stable.  Continue wearing CPAP at night.

## 2022-07-28 NOTE — Patient Instructions (Signed)
It was great to see you!  We are checking your labs today and will let you know the results via mychart/phone.   I have ordered a CT scan to follow-up on the nodules in your right lung. They will call to schedule.   Drink plenty of fluids.   Let's follow-up in 3 months, sooner if you have concerns.  If a referral was placed today, you will be contacted for an appointment. Please note that routine referrals can sometimes take up to 3-4 weeks to process. Please call our office if you haven't heard anything after this time frame.  Take care,  Vance Peper, NP

## 2022-07-28 NOTE — Assessment & Plan Note (Signed)
She has been taking an iron supplement daily when she remembers.  Will check a CBC and iron panel today.  Adjust regimen based on results.

## 2022-07-28 NOTE — Assessment & Plan Note (Signed)
BMI 40.6. Discussed nutrition, exercise.

## 2022-07-28 NOTE — Assessment & Plan Note (Signed)
Chronic, stable.  She has been checking her blood sugars at home and they have been in the 1 teens.  Continue metformin XR 750 mg daily with breakfast.  She is up-to-date on her pneumonia vaccine.  She has an eye appointment scheduled next March with [facility].  Foot exam done today was normal.  Will check CMP, CBC, A1c, lipid panel, urine microalbumin.  Follow-up in 3 months.

## 2022-07-29 LAB — IRON,TIBC AND FERRITIN PANEL
%SAT: 28 % (calc) (ref 16–45)
Ferritin: 48 ng/mL (ref 16–232)
Iron: 95 ug/dL (ref 45–160)
TIBC: 338 mcg/dL (calc) (ref 250–450)

## 2022-08-03 ENCOUNTER — Other Ambulatory Visit: Payer: Self-pay

## 2022-08-03 ENCOUNTER — Encounter: Payer: Self-pay | Admitting: Nurse Practitioner

## 2022-08-03 DIAGNOSIS — E1165 Type 2 diabetes mellitus with hyperglycemia: Secondary | ICD-10-CM

## 2022-08-03 DIAGNOSIS — E1100 Type 2 diabetes mellitus with hyperosmolarity without nonketotic hyperglycemic-hyperosmolar coma (NKHHC): Secondary | ICD-10-CM

## 2022-08-03 DIAGNOSIS — Z1211 Encounter for screening for malignant neoplasm of colon: Secondary | ICD-10-CM

## 2022-08-10 ENCOUNTER — Other Ambulatory Visit (HOSPITAL_COMMUNITY): Payer: Self-pay

## 2022-08-18 ENCOUNTER — Other Ambulatory Visit (HOSPITAL_COMMUNITY): Payer: Self-pay

## 2022-08-19 DIAGNOSIS — Z1211 Encounter for screening for malignant neoplasm of colon: Secondary | ICD-10-CM | POA: Diagnosis not present

## 2022-08-30 LAB — COLOGUARD: COLOGUARD: NEGATIVE

## 2022-09-22 DIAGNOSIS — H04123 Dry eye syndrome of bilateral lacrimal glands: Secondary | ICD-10-CM | POA: Diagnosis not present

## 2022-09-22 DIAGNOSIS — H0288B Meibomian gland dysfunction left eye, upper and lower eyelids: Secondary | ICD-10-CM | POA: Diagnosis not present

## 2022-09-22 DIAGNOSIS — E119 Type 2 diabetes mellitus without complications: Secondary | ICD-10-CM | POA: Diagnosis not present

## 2022-09-22 DIAGNOSIS — H35363 Drusen (degenerative) of macula, bilateral: Secondary | ICD-10-CM | POA: Diagnosis not present

## 2022-09-22 DIAGNOSIS — H35033 Hypertensive retinopathy, bilateral: Secondary | ICD-10-CM | POA: Diagnosis not present

## 2022-09-22 DIAGNOSIS — H0288A Meibomian gland dysfunction right eye, upper and lower eyelids: Secondary | ICD-10-CM | POA: Diagnosis not present

## 2022-09-22 LAB — HM DIABETES EYE EXAM

## 2022-09-25 ENCOUNTER — Encounter: Payer: Self-pay | Admitting: Nurse Practitioner

## 2022-10-13 ENCOUNTER — Ambulatory Visit
Admission: RE | Admit: 2022-10-13 | Discharge: 2022-10-13 | Disposition: A | Discharge status: Home / Self Care | Payer: 59 | Source: Ambulatory Visit | Attending: Nurse Practitioner | Admitting: Nurse Practitioner

## 2022-10-13 DIAGNOSIS — R911 Solitary pulmonary nodule: Secondary | ICD-10-CM | POA: Diagnosis not present

## 2022-10-17 ENCOUNTER — Other Ambulatory Visit: Payer: Self-pay | Admitting: Nurse Practitioner

## 2022-10-17 DIAGNOSIS — E041 Nontoxic single thyroid nodule: Secondary | ICD-10-CM

## 2022-11-24 ENCOUNTER — Other Ambulatory Visit: Payer: Self-pay | Admitting: Nurse Practitioner

## 2022-11-24 DIAGNOSIS — Z1231 Encounter for screening mammogram for malignant neoplasm of breast: Secondary | ICD-10-CM

## 2022-11-28 DIAGNOSIS — G4733 Obstructive sleep apnea (adult) (pediatric): Secondary | ICD-10-CM | POA: Diagnosis not present

## 2022-12-05 NOTE — Progress Notes (Deleted)
PATIENT: Christina Schwartz DOB: Nov 17, 1970  REASON FOR VISIT: follow up HISTORY FROM: patient  No chief complaint on file.    HISTORY OF PRESENT ILLNESS:  12/07/2022 ALL: Christina Schwartz returns for follow up for OSA on CPAP.     12/07/2021 ALL: Christina Schwartz returns for follow up for OSA on CPAP. She is doing well on therapy. She is using CPAP nightly for about 7 hours. She denies concerns with machine or supplies. She does not like dealing with DME. She orders supplies every 3 months due to concerns of over billing.     12/07/2020 ALL:  She returns for follow up for OSA on CPAP. She continues to do very well on CPAP. She is using CPAP nightly and more than 4 hours every night. She does continue to note benefit with CPAP. She denies concerns with CPAP machine or supplies.      07/30/2019 ALL:  Christina Schwartz is a 52 y.o. female here today for follow up for OSA on CPAP.  Sleep study revealed severe OSA with AHI of 47.5/h and REM AHI of 78/h. O2 nadir of 78%.  She returns today for her initial CPAP review.  She reports that she is doing very well on CPAP therapy.  She denies any difficulty with her machine.  She is currently using a fullface mask but is interested in trying nasal pillows.  No difficulty with current mask but that she might prefer a nasal pillow.  She has noted significant improvements in sleep quality and daytime energy levels.  Compliance report dated 06/29/2019 through 07/28/2019 reveals that she used CPAP 30 out of the last 30 days for compliance of 100%.  28 of the last 30 days she used CPAP greater than 4 hours for compliance of 93%.  Average usage was 6 hours and 49 minutes.  Residual AHI was 3.5 on a set pressure of 8 cm of water and an EPR of 3.  There was no significant leak noted.  HISTORY: (copied from Dr Teofilo Pod note on 02/13/2019)  Dear Dr. Judee Clara,    I saw your patient, Annajean Patruno, upon your kind request in my sleep clinic today for initial consultation of her  sleep disturbance, in particular, concern for underlying obstructive sleep apnea.  The patient is unaccompanied today.  As you know, Ms. Weinbaum is a 52 year old right-handed woman with an underlying medical history of hypertension, diabetes, hypertriglyceridemia, anemia and obesity, who reports snoring and excessive daytime somnolence.  She has a family history of sleep apnea.  I reviewed your office note from 02/05/2019. Her Epworth sleepiness score is 13 out of 24, fatigue severity score is 48 out of 63.  She is typically in bed between 930 and 10 and rise time is around 6.  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  She lives alone and for grown children.  She has no pets in the household.  Recently when she stayed at her mother's house, her sister-in-law noticed apneas while patient was asleep.  They shared a room at the time.  Her brother has sleep apnea and uses a CPAP machine as well as her sister and she also reports that her father had sleep apnea, he passed away at age 68.  She has rare morning headaches, no night to night nocturia.  She denies any telltale symptoms of restless leg syndrome.  She would be willing to get tested for sleep apnea and consider CPAP therapy.  She  is trying to lose weight through weight watchers and has lost 5 pounds thus far.  She is a non-smoker and does not drink alcohol and drinks caffeine and limitation, occasional soda, no daily coffee or tea.    REVIEW OF SYSTEMS: Out of a complete 14 system review of symptoms, the patient complains only of the following symptoms, none and all other reviewed systems are negative.  Epworth sleepiness scale: 7/24  ALLERGIES: Allergies  Allergen Reactions   Amlodipine Other (See Comments)    Headache and dizziness   Lisinopril Cough    HOME MEDICATIONS: Outpatient Medications Prior to Visit  Medication Sig Dispense Refill   atenolol (TENORMIN) 50 MG tablet Take 1 tablet (50 mg  total) by mouth daily. 90 tablet 3   atorvastatin (LIPITOR) 20 MG tablet Take 1 tablet (20 mg total) by mouth daily. 90 tablet 3   Blood Glucose Monitoring Suppl (FREESTYLE LITE) DEVI      FREESTYLE LITE test strip Check glucose once a day in the morning 100 each 3   hydrochlorothiazide (MICROZIDE) 12.5 MG capsule Take 1 capsule (12.5 mg total) by mouth daily as needed. 90 capsule 1   Iron-Vitamins (GERITOL PO) Take 1 tablet by mouth daily.     Lancets (FREESTYLE) lancets      meclizine (ANTIVERT) 25 MG tablet Take 1 tablet (25 mg total) by mouth 3 (three) times daily as needed for dizziness. 30 tablet 0   metFORMIN (GLUCOPHAGE-XR) 750 MG 24 hr tablet Take 1 tablet (750 mg total) by mouth daily with breakfast. 90 tablet 3   No facility-administered medications prior to visit.    PAST MEDICAL HISTORY: Past Medical History:  Diagnosis Date   Anemia    Heart murmur    Hypertension    Kidney stone    Lung nodule    Vertigo     PAST SURGICAL HISTORY: Past Surgical History:  Procedure Laterality Date   TUBAL LIGATION     WISDOM TOOTH EXTRACTION      FAMILY HISTORY: Family History  Problem Relation Age of Onset   Hypertension Father    Pulmonary embolism Father    Diabetes Maternal Grandmother    Diabetes Sister    Lymphoma Sister     SOCIAL HISTORY: Social History   Socioeconomic History   Marital status: Legally Separated    Spouse name: bruce   Number of children: 4   Years of education: some college   Highest education level: Not on file  Occupational History   Occupation: environmental services    Employer: [employer]  Tobacco Use   Smoking status: Never   Smokeless tobacco: Never  Vaping Use   Vaping Use: Never used  Substance and Sexual Activity   Alcohol use: No   Drug use: No   Sexual activity: Not Currently    Partners: Male    Birth control/protection: Post-menopausal    Comment: BTL  Other Topics Concern   Not on file  Social History Narrative    Lives with 2 children.      No guns in home.   Wears seatbelt.   Social Determinants of Health   Financial Resource Strain: Low Risk  (11/27/2017)   Overall Financial Resource Strain (CARDIA)    Difficulty of Paying Living Expenses: Not very hard  Food Insecurity: No Food Insecurity (11/27/2017)   Hunger Vital Sign    Worried About Running Out of Food in the Last Year: Never true    Ran Out of Food in  the Last Year: Never true  Transportation Needs: No Transportation Needs (11/27/2017)   PRAPARE - Administrator, Civil Service (Medical): No    Lack of Transportation (Non-Medical): No  Physical Activity: Not on file  Stress: Not on file  Social Connections: Not on file  Intimate Partner Violence: Not on file      PHYSICAL EXAM  There were no vitals filed for this visit.   There is no height or weight on file to calculate BMI.  Generalized: Well developed, in no acute distress  Cardiology: normal rate and rhythm, no murmur noted Respiratory: Clear to auscultation bilaterally Neurological examination  Mentation: Alert oriented to time, place, history taking. Follows all commands speech and language fluent Cranial nerve II-XII: Pupils were equal round reactive to light. Extraocular movements were full, visual field were full  Motor: The motor testing reveals 5 over 5 strength of all 4 extremities. Good symmetric motor tone is noted throughout.  Gait and station: Gait is normal.   DIAGNOSTIC DATA (LABS, IMAGING, TESTING) - I reviewed patient records, labs, notes, testing and imaging myself where available.      No data to display           Lab Results  Component Value Date   WBC 6.9 07/28/2022   HGB 12.6 07/28/2022   HCT 39.0 07/28/2022   MCV 74.2 (L) 07/28/2022   PLT 260.0 07/28/2022      Component Value Date/Time   NA 139 07/28/2022 0924   NA 139 09/09/2020 1354   K 3.8 07/28/2022 0924   CL 104 07/28/2022 0924   CO2 26 07/28/2022 0924    GLUCOSE 93 07/28/2022 0924   BUN 15 07/28/2022 0924   BUN 16 09/09/2020 1354   CREATININE 0.75 07/28/2022 0924   CALCIUM 9.1 07/28/2022 0924   PROT 6.9 07/28/2022 0924   PROT 7.0 09/09/2020 1354   ALBUMIN 4.1 07/28/2022 0924   ALBUMIN 4.0 09/09/2020 1354   AST 14 07/28/2022 0924   ALT 21 07/28/2022 0924   ALKPHOS 79 07/28/2022 0924   BILITOT 1.0 07/28/2022 0924   BILITOT 0.5 09/09/2020 1354   GFRNONAA 85 07/14/2019 1621   GFRAA 98 07/14/2019 1621   Lab Results  Component Value Date   CHOL 134 07/28/2022   HDL 40.90 07/28/2022   LDLCALC 67 07/28/2022   TRIG 135.0 07/28/2022   CHOLHDL 3 07/28/2022   Lab Results  Component Value Date   HGBA1C 7.4 (H) 07/28/2022   Lab Results  Component Value Date   VITAMINB12 852 05/31/2018   Lab Results  Component Value Date   TSH 0.849 11/27/2017       ASSESSMENT AND PLAN 52 y.o. year old female  has a past medical history of Anemia, Heart murmur, Hypertension, Kidney stone, Lung nodule, and Vertigo. here with   No diagnosis found.  Alexandrine was doing very well on CPAP therapy. Compliance report reveals excellent compliance.  She was encouraged to continue using CPAP nightly and for greater than 4 hours each night.  We will send a new order today for supplies  She was encouraged to continue healthy lifestyle habits. She will follow-up with me in 1 year, sooner if needed.  She verbalizes understanding and agreement with this plan.   No orders of the defined types were placed in this encounter.    No orders of the defined types were placed in this encounter.      Shawnie Dapper, FNP-C 12/05/2022, 1:00 PM [facility]  [address] [place], [place] [address] [phone]

## 2022-12-07 ENCOUNTER — Ambulatory Visit: Payer: 59 | Admitting: Family Medicine

## 2022-12-07 ENCOUNTER — Telehealth: Payer: Self-pay | Admitting: Family Medicine

## 2022-12-07 DIAGNOSIS — G4733 Obstructive sleep apnea (adult) (pediatric): Secondary | ICD-10-CM

## 2022-12-07 NOTE — Telephone Encounter (Signed)
Pt stated she can't make appointment today because car is broke down.

## 2022-12-14 ENCOUNTER — Other Ambulatory Visit (HOSPITAL_COMMUNITY): Payer: Self-pay

## 2022-12-22 ENCOUNTER — Ambulatory Visit
Admission: RE | Admit: 2022-12-22 | Discharge: 2022-12-22 | Disposition: A | Discharge status: Home / Self Care | Payer: 59 | Source: Ambulatory Visit | Attending: Nurse Practitioner | Admitting: Nurse Practitioner

## 2022-12-22 DIAGNOSIS — Z1231 Encounter for screening mammogram for malignant neoplasm of breast: Secondary | ICD-10-CM

## 2022-12-22 DIAGNOSIS — E041 Nontoxic single thyroid nodule: Secondary | ICD-10-CM | POA: Diagnosis not present

## 2022-12-25 ENCOUNTER — Other Ambulatory Visit: Payer: Self-pay | Admitting: Nurse Practitioner

## 2022-12-25 DIAGNOSIS — E041 Nontoxic single thyroid nodule: Secondary | ICD-10-CM

## 2022-12-27 ENCOUNTER — Other Ambulatory Visit: Payer: Self-pay | Admitting: Nurse Practitioner

## 2022-12-27 DIAGNOSIS — R928 Other abnormal and inconclusive findings on diagnostic imaging of breast: Secondary | ICD-10-CM

## 2022-12-29 DIAGNOSIS — G4733 Obstructive sleep apnea (adult) (pediatric): Secondary | ICD-10-CM | POA: Diagnosis not present

## 2022-12-30 ENCOUNTER — Other Ambulatory Visit: Payer: Self-pay | Admitting: Nurse Practitioner

## 2022-12-30 ENCOUNTER — Ambulatory Visit
Admission: RE | Admit: 2022-12-30 | Discharge: 2022-12-30 | Disposition: A | Discharge status: Home / Self Care | Payer: 59 | Source: Ambulatory Visit | Attending: Nurse Practitioner | Admitting: Nurse Practitioner

## 2022-12-30 DIAGNOSIS — R928 Other abnormal and inconclusive findings on diagnostic imaging of breast: Secondary | ICD-10-CM

## 2022-12-30 DIAGNOSIS — N6320 Unspecified lump in the left breast, unspecified quadrant: Secondary | ICD-10-CM

## 2022-12-30 DIAGNOSIS — N6322 Unspecified lump in the left breast, upper inner quadrant: Secondary | ICD-10-CM | POA: Diagnosis not present

## 2023-01-28 DIAGNOSIS — G4733 Obstructive sleep apnea (adult) (pediatric): Secondary | ICD-10-CM | POA: Diagnosis not present

## 2023-01-30 ENCOUNTER — Other Ambulatory Visit: Payer: 59

## 2023-02-14 ENCOUNTER — Ambulatory Visit
Admission: RE | Admit: 2023-02-14 | Discharge: 2023-02-14 | Disposition: A | Discharge status: Home / Self Care | Payer: 59 | Source: Ambulatory Visit | Attending: Nurse Practitioner | Admitting: Nurse Practitioner

## 2023-02-14 ENCOUNTER — Other Ambulatory Visit (HOSPITAL_COMMUNITY)
Admission: RE | Admit: 2023-02-14 | Discharge: 2023-02-14 | Disposition: A | Discharge status: Home / Self Care | Payer: 59 | Source: Ambulatory Visit | Attending: Nurse Practitioner | Admitting: Nurse Practitioner

## 2023-02-14 DIAGNOSIS — E041 Nontoxic single thyroid nodule: Secondary | ICD-10-CM | POA: Diagnosis not present

## 2023-02-14 DIAGNOSIS — E0789 Other specified disorders of thyroid: Secondary | ICD-10-CM | POA: Diagnosis not present

## 2023-02-23 ENCOUNTER — Telehealth: Payer: Self-pay | Admitting: Nurse Practitioner

## 2023-02-23 NOTE — Telephone Encounter (Signed)
Pt is anxious about her results from her biopsy. Please call.

## 2023-02-26 NOTE — Telephone Encounter (Signed)
Called patient and made her aware of results.  Pt expressed understanding

## 2023-05-07 ENCOUNTER — Other Ambulatory Visit: Payer: Self-pay

## 2023-05-07 ENCOUNTER — Ambulatory Visit: Payer: 59 | Admitting: Nurse Practitioner

## 2023-05-07 ENCOUNTER — Other Ambulatory Visit (HOSPITAL_COMMUNITY): Payer: Self-pay

## 2023-05-07 ENCOUNTER — Encounter: Payer: Self-pay | Admitting: Nurse Practitioner

## 2023-05-07 VITALS — BP 160/100 | HR 67 | Temp 98.3°F | Resp 18 | Ht 66.0 in | Wt 249.6 lb

## 2023-05-07 DIAGNOSIS — E1165 Type 2 diabetes mellitus with hyperglycemia: Secondary | ICD-10-CM

## 2023-05-07 DIAGNOSIS — E040 Nontoxic diffuse goiter: Secondary | ICD-10-CM | POA: Insufficient documentation

## 2023-05-07 DIAGNOSIS — R911 Solitary pulmonary nodule: Secondary | ICD-10-CM | POA: Diagnosis not present

## 2023-05-07 DIAGNOSIS — E1159 Type 2 diabetes mellitus with other circulatory complications: Secondary | ICD-10-CM

## 2023-05-07 DIAGNOSIS — E1169 Type 2 diabetes mellitus with other specified complication: Secondary | ICD-10-CM

## 2023-05-07 DIAGNOSIS — E785 Hyperlipidemia, unspecified: Secondary | ICD-10-CM | POA: Diagnosis not present

## 2023-05-07 DIAGNOSIS — Z7984 Long term (current) use of oral hypoglycemic drugs: Secondary | ICD-10-CM

## 2023-05-07 DIAGNOSIS — I872 Venous insufficiency (chronic) (peripheral): Secondary | ICD-10-CM

## 2023-05-07 DIAGNOSIS — I152 Hypertension secondary to endocrine disorders: Secondary | ICD-10-CM

## 2023-05-07 LAB — RENAL FUNCTION PANEL
Albumin: 4 g/dL (ref 3.5–5.2)
BUN: 19 mg/dL (ref 6–23)
CO2: 28 meq/L (ref 19–32)
Calcium: 9.3 mg/dL (ref 8.4–10.5)
Chloride: 102 meq/L (ref 96–112)
Creatinine, Ser: 0.85 mg/dL (ref 0.40–1.20)
GFR: 78.85 mL/min (ref >60.00)
Glucose, Bld: 130 mg/dL — ABNORMAL HIGH (ref 70–99)
Phosphorus: 3.8 mg/dL (ref 2.3–4.6)
Potassium: 4 meq/L (ref 3.5–5.1)
Sodium: 139 meq/L (ref 135–145)

## 2023-05-07 LAB — LDL CHOLESTEROL, DIRECT: Direct LDL: 78 mg/dL

## 2023-05-07 LAB — HEMOGLOBIN A1C: Hgb A1c MFr Bld: 7.9 % — ABNORMAL HIGH (ref 4.6–6.5)

## 2023-05-07 LAB — TSH: TSH: 1.09 u[IU]/mL (ref 0.35–5.50)

## 2023-05-07 LAB — MICROALBUMIN / CREATININE URINE RATIO
Creatinine,U: 58.2 mg/dL
Microalb Creat Ratio: 2.9 mg/g (ref 0.0–30.0)
Microalb, Ur: 1.7 mg/dL (ref 0.0–1.9)

## 2023-05-07 MED ORDER — METFORMIN HCL ER 750 MG PO TB24
750.0000 mg | ORAL_TABLET | Freq: Every day | ORAL | 3 refills | Status: DC
Start: 2023-05-07 — End: 2024-05-25
  Filled 2023-05-07 – 2023-07-12 (×2): qty 90, 90d supply, fill #0
  Filled 2023-10-31: qty 90, 90d supply, fill #1
  Filled 2024-02-03: qty 90, 90d supply, fill #2

## 2023-05-07 MED ORDER — ATENOLOL 50 MG PO TABS
50.0000 mg | ORAL_TABLET | Freq: Every day | ORAL | 3 refills | Status: DC
  Filled 2023-05-07 – 2023-07-12 (×2): qty 90, 90d supply, fill #0
  Filled 2023-10-31: qty 90, 90d supply, fill #1
  Filled 2024-02-03: qty 90, 90d supply, fill #2
  Filled 2024-05-03: qty 90, 90d supply, fill #3

## 2023-05-07 MED ORDER — HYDROCHLOROTHIAZIDE 12.5 MG PO CAPS
12.5000 mg | ORAL_CAPSULE | Freq: Every day | ORAL | 1 refills | Status: DC | PRN
Start: 2023-05-07 — End: 2023-07-15
  Filled 2023-05-07 – 2023-07-12 (×2): qty 90, 90d supply, fill #0

## 2023-05-07 MED ORDER — ATORVASTATIN CALCIUM 20 MG PO TABS
20.0000 mg | ORAL_TABLET | Freq: Every day | ORAL | 3 refills | Status: AC
Start: 2023-05-07 — End: ?
  Filled 2023-05-07 (×2): qty 90, 90d supply, fill #0

## 2023-05-07 NOTE — Assessment & Plan Note (Signed)
Repeat CT chest 10/2022: Two subpleural nodules in the right lower lobe measuring up to 5 mm,likely benign subpleural lymph nodes. No follow-up needed if patient is low-risk. Non-contrast chest CT can be considered in 12 months if patient is high-risk. This recommendation follows the consensus statement: Guidelines for Management of Incidental Pulmonary Nodules.  No previous tobacco use. No need for repeat CT at this time

## 2023-05-07 NOTE — Patient Instructions (Signed)
Go to lab maintain Heart healthy diet and daily exercise. Maintain current medications. Monitor BP daily in AM Send BP readings via mychart in 1week

## 2023-05-07 NOTE — Progress Notes (Signed)
Established Patient Visit  Patient: Christina Schwartz   DOB: 1971-03-03   52 y.o. Female  MRN: [id number] Visit Date: 05/07/2023  Subjective:    Chief Complaint  Patient presents with   office visit     PT is here for 5 month follow up diabetes, and CT scan. Patient is due for foot exam and A1c.    HPI Hypertension associated with diabetes (HCC) Elevated BP today due to inconsistent med dose Current use of atenolol daily, but hydrochlorothiazide prn BP Readings from Last 3 Encounters:  05/07/23 (!) 160/100  07/28/22 136/84  07/19/22 138/86    Advised about importance of DASH daiet, and med dose compliance Advised to send BP reading via mychart in 1week Repeat BMP F/up in 48month  Pulmonary nodule Repeat CT chest 10/2022: Two subpleural nodules in the right lower lobe measuring up to 5 mm,likely benign subpleural lymph nodes. No follow-up needed if patient is low-risk. Non-contrast chest CT can be considered in 12 months if patient is high-risk. This recommendation follows the consensus statement: Guidelines for Management of Incidental Pulmonary Nodules.  No previous tobacco use. No need for repeat CT at this time  Type 2 diabetes mellitus with hyperglycemia, without long-term current use of insulin (HCC) Normal foot exam BP not at goal No adverse effects with metformin  Repeat UACr, BMP, HgbA1c and LDL Normal UACr Maintain med dose  Goiter diffuse Benign follicular nodule (Bethesda category II) per thyroid biopsy 02/2023.  Check Tsh today  Dyslipidemia associated with type 2 diabetes mellitus (HCC) Repeat direct LDL Maintain atorvastatin dose  BP Readings from Last 3 Encounters:  05/07/23 (!) 160/100  07/28/22 136/84  07/19/22 138/86    Reviewed medical, surgical, and social history today  Medications: Outpatient Medications Prior to Visit  Medication Sig   Blood Glucose Monitoring Suppl (FREESTYLE LITE) DEVI    FREESTYLE LITE test strip  Check glucose once a day in the morning   Iron-Vitamins (GERITOL PO) Take 1 tablet by mouth daily.   Lancets (FREESTYLE) lancets    meclizine (ANTIVERT) 25 MG tablet Take 1 tablet (25 mg total) by mouth 3 (three) times daily as needed for dizziness.   [DISCONTINUED] atenolol (TENORMIN) 50 MG tablet Take 1 tablet (50 mg total) by mouth daily.   [DISCONTINUED] atorvastatin (LIPITOR) 20 MG tablet Take 1 tablet (20 mg total) by mouth daily.   [DISCONTINUED] hydrochlorothiazide (MICROZIDE) 12.5 MG capsule Take 1 capsule (12.5 mg total) by mouth daily as needed.   [DISCONTINUED] metFORMIN (GLUCOPHAGE-XR) 750 MG 24 hr tablet Take 1 tablet (750 mg total) by mouth daily with breakfast.   No facility-administered medications prior to visit.   Reviewed past medical and social history.   ROS per HPI above      Objective:  BP (!) 160/100   Pulse 67   Temp 98.3 F (36.8 C) (Temporal)   Resp 18   Ht 5\' 6"  (1.676 m)   Wt 249 lb 9.6 oz (113.2 kg)   LMP 07/24/2020   SpO2 100%   BMI 40.29 kg/m      Physical Exam Cardiovascular:     Rate and Rhythm: Normal rate and regular rhythm.     Pulses: Normal pulses.     Heart sounds: Normal heart sounds.  Pulmonary:     Effort: Pulmonary effort is normal.     Breath sounds: Normal breath sounds.  Musculoskeletal:     Right  lower leg: Edema present.     Left lower leg: Edema present.  Skin:    General: Skin is warm and dry.     Findings: No erythema.  Neurological:     Mental Status: She is alert and oriented to person, place, and time.     Results for orders placed or performed in visit on 05/07/23  Microalbumin / creatinine urine ratio  Result Value Ref Range   Microalb, Ur 1.7 0.0 - 1.9 mg/dL   Creatinine,U 30.1 mg/dL   Microalb Creat Ratio 2.9 0.0 - 30.0 mg/g      Assessment & Plan:    Problem List Items Addressed This Visit     Dyslipidemia associated with type 2 diabetes mellitus (HCC)    Repeat direct LDL Maintain  atorvastatin dose      Relevant Medications   atorvastatin (LIPITOR) 20 MG tablet   metFORMIN (GLUCOPHAGE-XR) 750 MG 24 hr tablet   Other Relevant Orders   Direct LDL   Goiter diffuse    Benign follicular nodule (Bethesda category II) per thyroid biopsy 02/2023.  Check Tsh today      Relevant Medications   atenolol (TENORMIN) 50 MG tablet   Other Relevant Orders   TSH   Hypertension associated with diabetes (HCC)    Elevated BP today due to inconsistent med dose Current use of atenolol daily, but hydrochlorothiazide prn BP Readings from Last 3 Encounters:  05/07/23 (!) 160/100  07/28/22 136/84  07/19/22 138/86    Advised about importance of DASH daiet, and med dose compliance Advised to send BP reading via mychart in 1week Repeat BMP F/up in 21month      Relevant Medications   atenolol (TENORMIN) 50 MG tablet   atorvastatin (LIPITOR) 20 MG tablet   hydrochlorothiazide (MICROZIDE) 12.5 MG capsule   metFORMIN (GLUCOPHAGE-XR) 750 MG 24 hr tablet   Other Relevant Orders   Renal Function Panel   Long term (current) use of oral hypoglycemic drugs   Relevant Medications   metFORMIN (GLUCOPHAGE-XR) 750 MG 24 hr tablet   Pulmonary nodule    Repeat CT chest 10/2022: Two subpleural nodules in the right lower lobe measuring up to 5 mm,likely benign subpleural lymph nodes. No follow-up needed if patient is low-risk. Non-contrast chest CT can be considered in 12 months if patient is high-risk. This recommendation follows the consensus statement: Guidelines for Management of Incidental Pulmonary Nodules.  No previous tobacco use. No need for repeat CT at this time      Type 2 diabetes mellitus with hyperglycemia, without long-term current use of insulin (HCC) - Primary    Normal foot exam BP not at goal No adverse effects with metformin  Repeat UACr, BMP, HgbA1c and LDL Normal UACr Maintain med dose      Relevant Medications   atorvastatin (LIPITOR) 20 MG tablet    metFORMIN (GLUCOPHAGE-XR) 750 MG 24 hr tablet   Other Relevant Orders   Microalbumin / creatinine urine ratio (Completed)   Renal Function Panel   Hemoglobin A1c   Venous insufficiency   Relevant Medications   atenolol (TENORMIN) 50 MG tablet   atorvastatin (LIPITOR) 20 MG tablet   hydrochlorothiazide (MICROZIDE) 12.5 MG capsule   Return in about 4 weeks (around 06/04/2023) for HTN.     Alysia Penna, NP

## 2023-05-07 NOTE — Assessment & Plan Note (Signed)
Repeat direct LDL Maintain atorvastatin dose

## 2023-05-07 NOTE — Assessment & Plan Note (Signed)
Benign follicular nodule (Bethesda category II) per thyroid biopsy 02/2023.  Check Tsh today

## 2023-05-07 NOTE — Assessment & Plan Note (Addendum)
Normal foot exam BP not at goal No adverse effects with metformin  Repeat UACr, BMP, HgbA1c and LDL Normal UACr Maintain med dose

## 2023-05-07 NOTE — Assessment & Plan Note (Signed)
Elevated BP today due to inconsistent med dose Current use of atenolol daily, but hydrochlorothiazide prn BP Readings from Last 3 Encounters:  05/07/23 (!) 160/100  07/28/22 136/84  07/19/22 138/86    Advised about importance of DASH daiet, and med dose compliance Advised to send BP reading via mychart in 1week Repeat BMP F/up in 8month

## 2023-05-17 ENCOUNTER — Other Ambulatory Visit (HOSPITAL_COMMUNITY): Payer: Self-pay

## 2023-05-17 ENCOUNTER — Encounter: Payer: Self-pay | Admitting: Family Medicine

## 2023-05-22 NOTE — Progress Notes (Unsigned)
PATIENT: Christina Schwartz DOB: May 13, 1971  REASON FOR VISIT: follow up HISTORY FROM: patient  No chief complaint on file.    HISTORY OF PRESENT ILLNESS:  05/24/2023 ALL: [name] returns for follow up for OSA on CPAP. She continues to do well on therapy. She is using CPAP nightly for about     12/07/2021 ALL: [name] returns for follow up for OSA on CPAP. She is doing well on therapy. She is using CPAP nightly for about 7 hours. She denies concerns with machine or supplies. She does not like dealing with DME. She orders supplies every 3 months due to concerns of over billing.     12/07/2020 ALL:  She returns for follow up for OSA on CPAP. She continues to do very well on CPAP. She is using CPAP nightly and more than 4 hours every night. She does continue to note benefit with CPAP. She denies concerns with CPAP machine or supplies.      07/30/2019 ALL:  [name] is a 52 y.o. female here today for follow up for OSA on CPAP.  Sleep study revealed severe OSA with AHI of 47.5/h and REM AHI of 78/h. O2 nadir of 78%.  She returns today for her initial CPAP review.  She reports that she is doing very well on CPAP therapy.  She denies any difficulty with her machine.  She is currently using a fullface mask but is interested in trying nasal pillows.  No difficulty with current mask but that she might prefer a nasal pillow.  She has noted significant improvements in sleep quality and daytime energy levels.  Compliance report dated 06/29/2019 through 07/28/2019 reveals that she used CPAP 30 out of the last 30 days for compliance of 100%.  28 of the last 30 days she used CPAP greater than 4 hours for compliance of 93%.  Average usage was 6 hours and 49 minutes.  Residual AHI was 3.5 on a set pressure of 8 cm of water and an EPR of 3.  There was no significant leak noted.  HISTORY: (copied from Dr Teofilo Pod note on 02/13/2019)  Dear Dr. Judee Clara,    I saw your patient, Christina Schwartz, upon your  kind request in my sleep clinic today for initial consultation of her sleep disturbance, in particular, concern for underlying obstructive sleep apnea.  The patient is unaccompanied today.  As you know, [name] is a 52 year old right-handed woman with an underlying medical history of hypertension, diabetes, hypertriglyceridemia, anemia and obesity, who reports snoring and excessive daytime somnolence.  She has a family history of sleep apnea.  I reviewed your office note from 02/05/2019. Her Epworth sleepiness score is 13 out of 24, fatigue severity score is 48 out of 63.  She is typically in bed between 930 and 10 and rise time is around 6.  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  She lives alone and for grown children.  She has no pets in the household.  Recently when she stayed at her mother's house, her sister-in-law noticed apneas while patient was asleep.  They shared a room at the time.  Her brother has sleep apnea and uses a CPAP machine as well as her sister and she also reports that her father had sleep apnea, he passed away at age 57.  She has rare morning headaches, no night to night nocturia.  She denies any telltale symptoms of restless leg syndrome.  She would  be willing to get tested for sleep apnea and consider CPAP therapy.  She is trying to lose weight through weight watchers and has lost 5 pounds thus far.  She is a non-smoker and does not drink alcohol and drinks caffeine and limitation, occasional soda, no daily coffee or tea.    REVIEW OF SYSTEMS: Out of a complete 14 system review of symptoms, the patient complains only of the following symptoms, none and all other reviewed systems are negative.  Epworth sleepiness scale: 7/24  ALLERGIES: Allergies  Allergen Reactions   Amlodipine Other (See Comments)    Headache and dizziness   Lisinopril Cough    HOME MEDICATIONS: Outpatient Medications Prior to Visit  Medication Sig Dispense  Refill   atenolol (TENORMIN) 50 MG tablet Take 1 tablet (50 mg total) by mouth daily. 90 tablet 3   atorvastatin (LIPITOR) 20 MG tablet Take 1 tablet (20 mg total) by mouth daily. 90 tablet 3   Blood Glucose Monitoring Suppl (FREESTYLE LITE) DEVI      FREESTYLE LITE test strip Check glucose once a day in the morning 100 each 3   hydrochlorothiazide (MICROZIDE) 12.5 MG capsule Take 1 capsule (12.5 mg total) by mouth daily as needed. 90 capsule 1   Iron-Vitamins (GERITOL PO) Take 1 tablet by mouth daily.     Lancets (FREESTYLE) lancets      meclizine (ANTIVERT) 25 MG tablet Take 1 tablet (25 mg total) by mouth 3 (three) times daily as needed for dizziness. 30 tablet 0   metFORMIN (GLUCOPHAGE-XR) 750 MG 24 hr tablet Take 1 tablet (750 mg total) by mouth daily with breakfast. 90 tablet 3   No facility-administered medications prior to visit.    PAST MEDICAL HISTORY: Past Medical History:  Diagnosis Date   Anemia    Heart murmur    Hypertension    Kidney stone    Lung nodule    Vertigo     PAST SURGICAL HISTORY: Past Surgical History:  Procedure Laterality Date   TUBAL LIGATION     WISDOM TOOTH EXTRACTION      FAMILY HISTORY: Family History  Problem Relation Age of Onset   Hypertension Father    Pulmonary embolism Father    Diabetes Maternal Grandmother    Diabetes Sister    Lymphoma Sister     SOCIAL HISTORY: Social History   Socioeconomic History   Marital status: Legally Separated    Spouse name: bruce   Number of children: 4   Years of education: some college   Highest education level: Associate degree: occupational, Scientist, product/process development, or vocational program  Occupational History   Occupation: Scientist, research (physical sciences): [employer]  Tobacco Use   Smoking status: Never   Smokeless tobacco: Never  Vaping Use   Vaping status: Never Used  Substance and Sexual Activity   Alcohol use: No   Drug use: No   Sexual activity: Not Currently    Partners: Male     Birth control/protection: Post-menopausal    Comment: BTL  Other Topics Concern   Not on file  Social History Narrative   Lives with 2 children.      No guns in home.   Wears seatbelt.   Social Determinants of Health   Financial Resource Strain: Low Risk  (05/06/2023)   Overall Financial Resource Strain (CARDIA)    Difficulty of Paying Living Expenses: Not very hard  Food Insecurity: No Food Insecurity (05/06/2023)   Hunger Vital Sign    Worried About  Running Out of Food in the Last Year: Never true    Ran Out of Food in the Last Year: Never true  Transportation Needs: No Transportation Needs (05/06/2023)   PRAPARE - Administrator, Civil Service (Medical): No    Lack of Transportation (Non-Medical): No  Physical Activity: Insufficiently Active (05/06/2023)   Exercise Vital Sign    Days of Exercise per Week: 3 days    Minutes of Exercise per Session: 20 min  Stress: No Stress Concern Present (05/06/2023)   [employer] of Occupational Health - Occupational Stress Questionnaire    Feeling of Stress : Only a little  Social Connections: Moderately Isolated (05/06/2023)   Social Connection and Isolation Panel [NHANES]    Frequency of Communication with Friends and Family: More than three times a week    Frequency of Social Gatherings with Friends and Family: More than three times a week    Attends Religious Services: More than 4 times per year    Active Member of [employer] or Organizations: No    Attends Engineer, structural: Not on file    Marital Status: Separated  Intimate Partner Violence: Not on file      PHYSICAL EXAM  There were no vitals filed for this visit.   There is no height or weight on file to calculate BMI.  Generalized: Well developed, in no acute distress  Cardiology: normal rate and rhythm, no murmur noted Respiratory: Clear to auscultation bilaterally Neurological examination  Mentation: Alert oriented to time, place, history  taking. Follows all commands speech and language fluent Cranial nerve II-XII: Pupils were equal round reactive to light. Extraocular movements were full, visual field were full  Motor: The motor testing reveals 5 over 5 strength of all 4 extremities. Good symmetric motor tone is noted throughout.  Gait and station: Gait is normal.   DIAGNOSTIC DATA (LABS, IMAGING, TESTING) - I reviewed patient records, labs, notes, testing and imaging myself where available.      No data to display           Lab Results  Component Value Date   WBC 6.9 07/28/2022   HGB 12.6 07/28/2022   HCT 39.0 07/28/2022   MCV 74.2 (L) 07/28/2022   PLT 260.0 07/28/2022      Component Value Date/Time   NA 139 05/07/2023 0831   NA 139 09/09/2020 1354   K 4.0 05/07/2023 0831   CL 102 05/07/2023 0831   CO2 28 05/07/2023 0831   GLUCOSE 130 (H) 05/07/2023 0831   BUN 19 05/07/2023 0831   BUN 16 09/09/2020 1354   CREATININE 0.85 05/07/2023 0831   CALCIUM 9.3 05/07/2023 0831   PROT 6.9 07/28/2022 0924   PROT 7.0 09/09/2020 1354   ALBUMIN 4.0 05/07/2023 0831   ALBUMIN 4.0 09/09/2020 1354   AST 14 07/28/2022 0924   ALT 21 07/28/2022 0924   ALKPHOS 79 07/28/2022 0924   BILITOT 1.0 07/28/2022 0924   BILITOT 0.5 09/09/2020 1354   GFRNONAA 85 07/14/2019 1621   GFRAA 98 07/14/2019 1621   Lab Results  Component Value Date   CHOL 134 07/28/2022   HDL 40.90 07/28/2022   LDLCALC 67 07/28/2022   LDLDIRECT 78.0 05/07/2023   TRIG 135.0 07/28/2022   CHOLHDL 3 07/28/2022   Lab Results  Component Value Date   HGBA1C 7.9 (H) 05/07/2023   Lab Results  Component Value Date   VITAMINB12 852 05/31/2018   Lab Results  Component Value Date  TSH 1.09 05/07/2023       ASSESSMENT AND PLAN 52 y.o. year old female  has a past medical history of Anemia, Heart murmur, Hypertension, Kidney stone, Lung nodule, and Vertigo. here with   No diagnosis found.    Aicha was doing very well on CPAP therapy. Compliance  report reveals excellent compliance.  She was encouraged to continue using CPAP nightly and for greater than 4 hours each night.  We will send a new order today for supplies  She was encouraged to continue healthy lifestyle habits. She will follow-up with me in 1 year, sooner if needed.  She verbalizes understanding and agreement with this plan.    No orders of the defined types were placed in this encounter.    No orders of the defined types were placed in this encounter.      Shawnie Dapper, FNP-C 05/22/2023, 9:38 AM [facility] [address], [address] [place], [place] [address] [phone]

## 2023-05-22 NOTE — Patient Instructions (Incomplete)
Please continue using your CPAP regularly. While your insurance requires that you use CPAP at least 4 hours each night on 70% of the nights, I recommend, that you not skip any nights and use it throughout the night if you can. Getting used to CPAP and staying with the treatment long term does take time and patience and discipline. Untreated obstructive sleep apnea when it is moderate to severe can have an adverse impact on cardiovascular health and raise her risk for heart disease, arrhythmias, hypertension, congestive heart failure, stroke and diabetes. Untreated obstructive sleep apnea causes sleep disruption, nonrestorative sleep, and sleep deprivation. This can have an impact on your day to day functioning and cause daytime sleepiness and impairment of cognitive function, memory loss, mood disturbance, and problems focussing. Using CPAP regularly can improve these symptoms.  We will update supply orders, today. Keep an eye on your blood pressure   Follow up in 1 year

## 2023-05-24 ENCOUNTER — Encounter: Payer: Self-pay | Admitting: Nurse Practitioner

## 2023-05-24 ENCOUNTER — Encounter: Payer: Self-pay | Admitting: Family Medicine

## 2023-05-24 ENCOUNTER — Ambulatory Visit (INDEPENDENT_AMBULATORY_CARE_PROVIDER_SITE_OTHER): Payer: 59 | Admitting: Family Medicine

## 2023-05-24 VITALS — BP 173/93 | HR 68 | Ht 66.0 in | Wt 249.0 lb

## 2023-05-24 DIAGNOSIS — E1159 Type 2 diabetes mellitus with other circulatory complications: Secondary | ICD-10-CM

## 2023-05-24 DIAGNOSIS — G4733 Obstructive sleep apnea (adult) (pediatric): Secondary | ICD-10-CM | POA: Diagnosis not present

## 2023-05-28 ENCOUNTER — Other Ambulatory Visit (HOSPITAL_COMMUNITY): Payer: Self-pay

## 2023-05-28 MED ORDER — AMLODIPINE BESYLATE 5 MG PO TABS
5.0000 mg | ORAL_TABLET | Freq: Every evening | ORAL | 5 refills | Status: DC
Start: 2023-05-28 — End: 2023-09-27
  Filled 2023-05-28 (×2): qty 30, 30d supply, fill #0

## 2023-05-29 DIAGNOSIS — G4733 Obstructive sleep apnea (adult) (pediatric): Secondary | ICD-10-CM | POA: Diagnosis not present

## 2023-05-29 NOTE — Telephone Encounter (Signed)
Per Brad[facility]: "Yes, this order was received and is currently in process by our re-supply team. Order was received on 05/25/23 @ 123pm and sent to re-supply for processing on 05/28/23 @ 242pm by our intake team. Order is currently in process by re-supply team. "  I asked him to reach out to pt to provide update as well.

## 2023-06-04 ENCOUNTER — Ambulatory Visit: Payer: 59 | Admitting: Nurse Practitioner

## 2023-06-04 ENCOUNTER — Encounter: Payer: Self-pay | Admitting: Nurse Practitioner

## 2023-06-04 VITALS — BP 148/90 | HR 82 | Temp 98.4°F | Resp 18 | Wt 246.4 lb

## 2023-06-04 DIAGNOSIS — I152 Hypertension secondary to endocrine disorders: Secondary | ICD-10-CM

## 2023-06-04 DIAGNOSIS — E1159 Type 2 diabetes mellitus with other circulatory complications: Secondary | ICD-10-CM | POA: Diagnosis not present

## 2023-06-04 NOTE — Assessment & Plan Note (Signed)
Bp not at goal due to med dose non compliance Current use of atenolol and hydrochlorothiazide daily Did not start amlodipine BP Readings from Last 3 Encounters:  06/04/23 (!) 148/90  05/24/23 (!) 173/93  05/07/23 (!) 160/100    Advised about importance of DASH daiet, and med dose compliance F/up in 42month

## 2023-06-04 NOTE — Patient Instructions (Signed)
Maintain DASH diet Take meds as prescribed: atenolol and hydrochlorothiazide in AM, amlodipine in PM Monitor BP daily in AM and record Use CPAP machine as instructed.  DASH Eating Plan DASH stands for Dietary Approaches to Stop Hypertension. The DASH eating plan is a healthy eating plan that has been shown to: Lower high blood pressure (hypertension). Reduce your risk for type 2 diabetes, heart disease, and stroke. Help with weight loss. What are tips for following this plan? Reading food labels Check food labels for the amount of salt (sodium) per serving. Choose foods with less than 5 percent of the Daily Value (DV) of sodium. In general, foods with less than 300 milligrams (mg) of sodium per serving fit into this eating plan. To find whole grains, look for the word "whole" as the first word in the ingredient list. Shopping Buy products labeled as "low-sodium" or "no salt added." Buy fresh foods. Avoid canned foods and pre-made or frozen meals. Cooking Try not to add salt when you cook. Use salt-free seasonings or herbs instead of table salt or sea salt. Check with your health care provider or pharmacist before using salt substitutes. Do not fry foods. Cook foods in healthy ways, such as baking, boiling, grilling, roasting, or broiling. Cook using oils that are good for your heart. These include olive, canola, avocado, soybean, and sunflower oil. Meal planning  Eat a balanced diet. This should include: 4 or more servings of fruits and 4 or more servings of vegetables each day. Try to fill half of your plate with fruits and vegetables. 6-8 servings of whole grains each day. 6 or less servings of lean meat, poultry, or fish each day. 1 oz is 1 serving. A 3 oz (85 g) serving of meat is about the same size as the palm of your hand. One egg is 1 oz (28 g). 2-3 servings of low-fat dairy each day. One serving is 1 cup (237 mL). 1 serving of nuts, seeds, or beans 5 times each week. 2-3  servings of heart-healthy fats. Healthy fats called omega-3 fatty acids are found in foods such as walnuts, flaxseeds, fortified milks, and eggs. These fats are also found in cold-water fish, such as sardines, salmon, and mackerel. Limit how much you eat of: Canned or prepackaged foods. Food that is high in trans fat, such as fried foods. Food that is high in saturated fat, such as fatty meat. Desserts and other sweets, sugary drinks, and other foods with added sugar. Full-fat dairy products. Do not salt foods before eating. Do not eat more than 4 egg yolks a week. Try to eat at least 2 vegetarian meals a week. Eat more home-cooked food and less restaurant, buffet, and fast food. Lifestyle When eating at a restaurant, ask if your food can be made with less salt or no salt. If you drink alcohol: Limit how much you have to: 0-1 drink a day if you are female. 0-2 drinks a day if you are female. Know how much alcohol is in your drink. In the [place] one drink is one 12 oz bottle of beer (355 mL), one 5 oz glass of wine (148 mL), or one 1 oz glass of hard liquor (44 mL). General information Avoid eating more than 2,300 mg of salt a day. If you have hypertension, you may need to reduce your sodium intake to 1,500 mg a day. Work with your provider to stay at a healthy body weight or lose weight. Ask what the best weight range is  for you. On most days of the week, get at least 30 minutes of exercise that causes your heart to beat faster. This may include walking, swimming, or biking. Work with your provider or dietitian to adjust your eating plan to meet your specific calorie needs. What foods should I eat? Fruits All fresh, dried, or frozen fruit. Canned fruits that are in their natural juice and do not have sugar added to them. Vegetables Fresh or frozen vegetables that are raw, steamed, roasted, or grilled. Low-sodium or reduced-sodium tomato and vegetable juice. Low-sodium or reduced-sodium  tomato sauce and tomato paste. Low-sodium or reduced-sodium canned vegetables. Grains Whole-grain or whole-wheat bread. Whole-grain or whole-wheat pasta. Brown rice. Orpah Cobb. Bulgur. Whole-grain and low-sodium cereals. Pita bread. Low-fat, low-sodium crackers. Whole-wheat flour tortillas. Meats and other proteins Skinless chicken or [place]. Ground chicken or [place]. Pork with fat trimmed off. Fish and seafood. Egg whites. Dried beans, peas, or lentils. Unsalted nuts, nut butters, and seeds. Unsalted canned beans. Lean cuts of beef with fat trimmed off. Low-sodium, lean precooked or cured meat, such as sausages or meat loaves. Dairy Low-fat (1%) or fat-free (skim) milk. Reduced-fat, low-fat, or fat-free cheeses. Nonfat, low-sodium ricotta or cottage cheese. Low-fat or nonfat yogurt. Low-fat, low-sodium cheese. Fats and oils Soft margarine without trans fats. Vegetable oil. Reduced-fat, low-fat, or light mayonnaise and salad dressings (reduced-sodium). Canola, safflower, olive, avocado, soybean, and sunflower oils. Avocado. Seasonings and condiments Herbs. Spices. Seasoning mixes without salt. Other foods Unsalted popcorn and pretzels. Fat-free sweets. The items listed above may not be all the foods and drinks you can have. Talk to a dietitian to learn more. What foods should I avoid? Fruits Canned fruit in a light or heavy syrup. Fried fruit. Fruit in cream or butter sauce. Vegetables Creamed or fried vegetables. Vegetables in a cheese sauce. Regular canned vegetables that are not marked as low-sodium or reduced-sodium. Regular canned tomato sauce and paste that are not marked as low-sodium or reduced-sodium. Regular tomato and vegetable juices that are not marked as low-sodium or reduced-sodium. Rosita Fire. Olives. Grains Baked goods made with fat, such as croissants, muffins, or some breads. Dry pasta or rice meal packs. Meats and other proteins Fatty cuts of meat. Ribs. Fried meat.  Tomasa Blase. Bologna, salami, and other precooked or cured meats, such as sausages or meat loaves, that are not lean and low in sodium. Fat from the back of a pig (fatback). Bratwurst. Salted nuts and seeds. Canned beans with added salt. Canned or smoked fish. Whole eggs or egg yolks. Chicken or [place] with skin. Dairy Whole or 2% milk, cream, and half-and-half. Whole or full-fat cream cheese. Whole-fat or sweetened yogurt. Full-fat cheese. Nondairy creamers. Whipped toppings. Processed cheese and cheese spreads. Fats and oils Butter. Stick margarine. Lard. Shortening. Ghee. Bacon fat. Tropical oils, such as coconut, palm kernel, or palm oil. Seasonings and condiments Onion salt, garlic salt, seasoned salt, table salt, and sea salt. Worcestershire sauce. Tartar sauce. Barbecue sauce. Teriyaki sauce. Soy sauce, including reduced-sodium soy sauce. Steak sauce. Canned and packaged gravies. Fish sauce. Oyster sauce. Cocktail sauce. Store-bought horseradish. Ketchup. Mustard. Meat flavorings and tenderizers. Bouillon cubes. Hot sauces. Pre-made or packaged marinades. Pre-made or packaged taco seasonings. Relishes. Regular salad dressings. Other foods Salted popcorn and pretzels. The items listed above may not be all the foods and drinks you should avoid. Talk to a dietitian to learn more. Where to find more information National Heart, Lung, and Blood Institute (NHLBI): BuffaloDryCleaner.gl American Heart Association (AHA): heart.org Academy  of Nutrition and Dietetics: eatright.org National Kidney Foundation (NKF): kidney.org This information is not intended to replace advice given to you by your health care provider. Make sure you discuss any questions you have with your health care provider. Document Revised: 07/13/2022 Document Reviewed: 07/13/2022 Elsevier Patient Education  2024 [employer].

## 2023-06-04 NOTE — Progress Notes (Signed)
                Established Patient Visit  Patient: Christina Schwartz   DOB: [birth year]   52 y.o. Female  MRN: [id number] Visit Date: 06/04/2023  Subjective:    Chief Complaint  Patient presents with   OFFICE VISIT     1 month follow up   Hypertension   Hypertension   Hypertension associated with diabetes (HCC) Bp not at goal due to med dose non compliance Current use of atenolol and hydrochlorothiazide daily Did not start amlodipine BP Readings from Last 3 Encounters:  06/04/23 (!) 148/90  05/24/23 (!) 173/93  05/07/23 (!) 160/100    Advised about importance of DASH daiet, and med dose compliance F/up in 35month   Wt Readings from Last 3 Encounters:  06/04/23 246 lb 6.4 oz (111.8 kg)  05/24/23 249 lb (112.9 kg)  05/07/23 249 lb 9.6 oz (113.2 kg)    Reviewed medical, surgical, and social history today  Medications: Outpatient Medications Prior to Visit  Medication Sig   amLODipine (NORVASC) 5 MG tablet Take 1 tablet (5 mg total) by mouth every evening.   atenolol (TENORMIN) 50 MG tablet Take 1 tablet (50 mg total) by mouth daily.   atorvastatin (LIPITOR) 20 MG tablet Take 1 tablet (20 mg total) by mouth daily.   Blood Glucose Monitoring Suppl (FREESTYLE LITE) DEVI    FREESTYLE LITE test strip Check glucose once a day in the morning   hydrochlorothiazide (MICROZIDE) 12.5 MG capsule Take 1 capsule (12.5 mg total) by mouth daily as needed.   Iron-Vitamins (GERITOL PO) Take 1 tablet by mouth daily.   Lancets (FREESTYLE) lancets    meclizine (ANTIVERT) 25 MG tablet Take 1 tablet (25 mg total) by mouth 3 (three) times daily as needed for dizziness.   metFORMIN (GLUCOPHAGE-XR) 750 MG 24 hr tablet Take 1 tablet (750 mg total) by mouth daily with breakfast.   No facility-administered medications prior to visit.   Reviewed past medical and social history.   ROS per HPI above      Objective:  BP (!) 148/90   Pulse 82   Temp 98.4 F (36.9 C) (Temporal)   Resp 18   Wt  246 lb 6.4 oz (111.8 kg)   LMP 07/24/2020   SpO2 99%   BMI 39.77 kg/m      Physical Exam Vitals reviewed.  Cardiovascular:     Rate and Rhythm: Normal rate.     Pulses: Normal pulses.  Pulmonary:     Effort: Pulmonary effort is normal.  Neurological:     Mental Status: She is alert and oriented to person, place, and time.     No results found for any visits on 06/04/23.    Assessment & Plan:    Problem List Items Addressed This Visit     Hypertension associated with diabetes (HCC) - Primary    Bp not at goal due to med dose non compliance Current use of atenolol and hydrochlorothiazide daily Did not start amlodipine BP Readings from Last 3 Encounters:  06/04/23 (!) 148/90  05/24/23 (!) 173/93  05/07/23 (!) 160/100    Advised about importance of DASH daiet, and med dose compliance F/up in 35month      Return in about 4 weeks (around 07/02/2023) for HTN (video).     Alysia Penna, NP

## 2023-06-28 DIAGNOSIS — G4733 Obstructive sleep apnea (adult) (pediatric): Secondary | ICD-10-CM | POA: Diagnosis not present

## 2023-06-29 ENCOUNTER — Ambulatory Visit
Admission: RE | Admit: 2023-06-29 | Discharge: 2023-06-29 | Disposition: A | Discharge status: Home / Self Care | Payer: 59 | Source: Ambulatory Visit | Attending: Nurse Practitioner | Admitting: Nurse Practitioner

## 2023-06-29 ENCOUNTER — Other Ambulatory Visit: Payer: 59

## 2023-06-29 ENCOUNTER — Other Ambulatory Visit: Payer: Self-pay | Admitting: Nurse Practitioner

## 2023-06-29 DIAGNOSIS — N6320 Unspecified lump in the left breast, unspecified quadrant: Secondary | ICD-10-CM

## 2023-06-29 DIAGNOSIS — R928 Other abnormal and inconclusive findings on diagnostic imaging of breast: Secondary | ICD-10-CM

## 2023-06-29 DIAGNOSIS — N6322 Unspecified lump in the left breast, upper inner quadrant: Secondary | ICD-10-CM | POA: Diagnosis not present

## 2023-07-09 ENCOUNTER — Telehealth: Payer: 59 | Admitting: Nurse Practitioner

## 2023-07-13 ENCOUNTER — Other Ambulatory Visit (HOSPITAL_COMMUNITY): Payer: Self-pay

## 2023-07-15 ENCOUNTER — Ambulatory Visit
Admission: EM | Admit: 2023-07-15 | Discharge: 2023-07-15 | Disposition: A | Discharge status: Home / Self Care | Payer: 59 | Attending: Family Medicine | Admitting: Family Medicine

## 2023-07-15 DIAGNOSIS — I872 Venous insufficiency (chronic) (peripheral): Secondary | ICD-10-CM | POA: Diagnosis not present

## 2023-07-15 DIAGNOSIS — I160 Hypertensive urgency: Secondary | ICD-10-CM | POA: Diagnosis not present

## 2023-07-15 DIAGNOSIS — I152 Hypertension secondary to endocrine disorders: Secondary | ICD-10-CM

## 2023-07-15 DIAGNOSIS — E1159 Type 2 diabetes mellitus with other circulatory complications: Secondary | ICD-10-CM

## 2023-07-15 MED ORDER — HYDROCHLOROTHIAZIDE 25 MG PO TABS
25.0000 mg | ORAL_TABLET | Freq: Every day | ORAL | 0 refills | Status: DC
  Filled 2023-07-15: qty 90, 90d supply, fill #0

## 2023-07-15 NOTE — ED Triage Notes (Addendum)
 Pt reports not being able to have her BP come down since 0345 this morning.   States she has been taking her BP medicine as prescribed and denies chest pain, dizziness, nausea and headaches.   BP taken at home before coming 177/95.   Last took medicine around 8am.

## 2023-07-15 NOTE — ED Provider Notes (Signed)
 [facility]  Note:  This document was prepared using Conservation officer, historic buildings and may include unintentional dictation errors.  MRN: [id number] DOB: 1970-08-19  Subjective:   Christina Schwartz is a 53 y.o. female presenting for recheck on her blood pressure.  She is supposed to be taking hydrochlorothiazide  12.5 mg and amlodipine  5 mg.  She is only taken the hydrochlorothiazide .  She has been taking her blood pressure readings at home and remain elevated in the 170s over 90s.  She had a headache overnight but it is completely resolved this morning.  No weakness, numbness or tingling, chest pain, heart racing, palpitations, nausea, vomiting, abdominal pain, hematuria.   No current facility-administered medications for this encounter.  Current Outpatient Medications:    amLODipine  (NORVASC ) 5 MG tablet, Take 1 tablet (5 mg total) by mouth every evening., Disp: 30 tablet, Rfl: 5   atenolol  (TENORMIN ) 50 MG tablet, Take 1 tablet (50 mg total) by mouth daily., Disp: 90 tablet, Rfl: 3   atorvastatin  (LIPITOR) 20 MG tablet, Take 1 tablet (20 mg total) by mouth daily., Disp: 90 tablet, Rfl: 3   Blood Glucose Monitoring Suppl (FREESTYLE LITE) DEVI, , Disp: , Rfl:    FREESTYLE LITE test strip, Check glucose once a day in the morning, Disp: 100 each, Rfl: 3   hydrochlorothiazide  (MICROZIDE ) 12.5 MG capsule, Take 1 capsule (12.5 mg total) by mouth daily as needed., Disp: 90 capsule, Rfl: 1   Iron-Vitamins (GERITOL PO), Take 1 tablet by mouth daily., Disp: , Rfl:    Lancets (FREESTYLE) lancets, , Disp: , Rfl:    meclizine  (ANTIVERT ) 25 MG tablet, Take 1 tablet (25 mg total) by mouth 3 (three) times daily as needed for dizziness., Disp: 30 tablet, Rfl: 0   metFORMIN  (GLUCOPHAGE -XR) 750 MG 24 hr tablet, Take 1 tablet (750 mg total) by mouth daily with breakfast., Disp: 90 tablet, Rfl: 3   Allergies  Allergen Reactions   Amlodipine  Other (See Comments)    Headache and  dizziness   Lisinopril  Cough    Past Medical History:  Diagnosis Date   Anemia    Heart murmur    Hypertension    Kidney stone    Lung nodule    Vertigo      Past Surgical History:  Procedure Laterality Date   TUBAL LIGATION     WISDOM TOOTH EXTRACTION      Family History  Problem Relation Age of Onset   Hypertension Father    Pulmonary embolism Father    Diabetes Maternal Grandmother    Diabetes Sister    Lymphoma Sister     Social History   Tobacco Use   Smoking status: Never   Smokeless tobacco: Never  Vaping Use   Vaping status: Never Used  Substance Use Topics   Alcohol use: No   Drug use: No    ROS   Objective:   Vitals: BP (!) 176/106 (BP Location: Left Arm) Comment: used large cuff  Pulse 73   Temp 99.3 F (37.4 C) (Oral)   LMP 07/24/2020   SpO2 96%   BP Readings from Last 3 Encounters:  07/15/23 (!) 176/106  06/04/23 (!) 148/90  05/24/23 (!) 173/93   Physical Exam Constitutional:      General: She is not in acute distress.    Appearance: Normal appearance. She is well-developed. She is not ill-appearing, toxic-appearing or diaphoretic.  HENT:     Head: Normocephalic and atraumatic.     Nose:  Nose normal.     Mouth/Throat:     Mouth: Mucous membranes are moist.  Eyes:     General: No scleral icterus.       Right eye: No discharge.        Left eye: No discharge.     Extraocular Movements: Extraocular movements intact.  Neck:     Vascular: No carotid bruit.  Cardiovascular:     Rate and Rhythm: Normal rate and regular rhythm.     Heart sounds: Normal heart sounds. No murmur heard.    No friction rub. No gallop.  Pulmonary:     Effort: Pulmonary effort is normal. No respiratory distress.     Breath sounds: No stridor. No wheezing, rhonchi or rales.  Chest:     Chest wall: No tenderness.  Skin:    General: Skin is warm and dry.  Neurological:     General: No focal deficit present.     Mental Status: She is alert and oriented  to person, place, and time.     Cranial Nerves: No cranial nerve deficit or facial asymmetry.     Motor: No weakness or pronator drift.     Coordination: Coordination normal.     Gait: Gait normal.  Psychiatric:        Mood and Affect: Mood normal.        Behavior: Behavior normal.     Assessment and Plan :   PDMP not reviewed this encounter.  1. Hypertensive urgency   2. Hypertension associated with diabetes (HCC)   3. Venous insufficiency    Emphasized need to start hypertensive friendly diet.  Will hold off on amlodipine , recommended hydrochlorothiazide  at 25 mg.  No signs of an acute encephalopathy or hypertensive emergency.  Emphasized need for close follow-up with her PCP.  Counseled patient on potential for adverse effects with medications prescribed/recommended today, ER and return-to-clinic precautions discussed, patient verbalized understanding.    Christopher Savannah, [place] 07/15/23 [phone]

## 2023-07-15 NOTE — Discharge Instructions (Addendum)
For diabetes or elevated blood sugar, please make sure you are limiting and avoiding starchy, carbohydrate foods like pasta, breads, sweet breads, pastry, rice, potatoes, desserts. These foods can elevate your blood sugar. Also, limit and avoid drinks that contain a lot of sugar such as sodas, sweet teas, fruit juices.  Drinking plain water will be much more helpful, try 64 ounces of water daily.  It is okay to flavor your water naturally by cutting cucumber, lemon, mint or lime, placing it in a picture with water and drinking it over a period of 24-48 hours as long as it remains refrigerated. ? ?For elevated blood pressure, make sure you are monitoring salt in your diet.  Do not eat restaurant foods and limit processed foods at home. I highly recommend you prepare and cook your own foods at home.  Processed foods include things like frozen meals, pre-seasoned meats and dinners, deli meats, canned foods as these foods contain a high amount of sodium/salt.  Make sure you are paying attention to sodium labels on foods you buy at the grocery store. Buy your spices separately such as garlic powder, onion powder, cumin, cayenne, parsley flakes so that you can avoid seasonings that contain salt. However, salt-free seasonings are available and can be used, an example is Mrs. Dash and includes a lot of different mixtures that do not contain salt. ? ?Lastly, when cooking using oils that are healthier for you is important. This includes olive oil, avocado oil, canola oil. We have discussed a lot of foods to avoid but below is a list of foods that can be very healthy to use in your diet whether it is for diabetes, cholesterol, high blood pressure, or in general healthy eating. ? ?Salads - kale, spinach, cabbage, spring mix, arugula ?Fruits - avocadoes, berries (blueberries, raspberries, blackberries), apples, oranges, pomegranate, grapefruit, kiwi ?Vegetables - asparagus, cauliflower, broccoli, green beans, brussel sprouts,  bell peppers, beets; stay away from or limit starchy vegetables like potatoes, carrots, peas ?Other general foods - kidney beans, egg whites, almonds, walnuts, sunflower seeds, pumpkin seeds, fat free yogurt, almond milk, flax seeds, quinoa, oats  ?Meat - It is better to eat lean meats and limit your red meat including pork to once a week.  Wild caught fish, chicken breast are good options as they tend to be leaner sources of good protein. Still be mindful of the sodium labels for the meats you buy. ? ?DO NOT EAT ANY FOODS ON THIS LIST THAT YOU ARE ALLERGIC TO. For more specific needs, I highly recommend consulting a dietician or nutritionist but this can definitely be a good starting point. ? ?

## 2023-07-16 ENCOUNTER — Other Ambulatory Visit (HOSPITAL_COMMUNITY): Payer: Self-pay

## 2023-07-16 ENCOUNTER — Other Ambulatory Visit: Payer: Self-pay

## 2023-07-29 DIAGNOSIS — G4733 Obstructive sleep apnea (adult) (pediatric): Secondary | ICD-10-CM | POA: Diagnosis not present

## 2023-08-02 ENCOUNTER — Ambulatory Visit: Payer: 59 | Admitting: Family Medicine

## 2023-09-23 ENCOUNTER — Encounter: Payer: Self-pay | Admitting: Family Medicine

## 2023-09-24 ENCOUNTER — Telehealth: Payer: Self-pay

## 2023-09-24 ENCOUNTER — Other Ambulatory Visit: Payer: Self-pay | Admitting: Family Medicine

## 2023-09-24 DIAGNOSIS — G4733 Obstructive sleep apnea (adult) (pediatric): Secondary | ICD-10-CM

## 2023-09-24 NOTE — Telephone Encounter (Signed)
 Copied from CRM [id number]. Topic: General - Other >> Sep 24, 2023  1:35 PM Rodman Pickle T wrote: Reason for CRM: patient is needing to change her appt she has schedule to be changed to see her dr dr Elease Etienne

## 2023-09-24 NOTE — Telephone Encounter (Signed)
 Christina Schwartz

## 2023-09-24 NOTE — Telephone Encounter (Signed)
 Can you override appointment slot for 11:20 am or 1:40 pm? Pt is overdue HTN. I haven't called her yet.

## 2023-09-24 NOTE — Telephone Encounter (Signed)
 Marland Kitchen

## 2023-09-25 ENCOUNTER — Ambulatory Visit: Admitting: Nurse Practitioner

## 2023-09-27 ENCOUNTER — Encounter: Payer: Self-pay | Admitting: Nurse Practitioner

## 2023-09-27 ENCOUNTER — Ambulatory Visit: Admitting: Nurse Practitioner

## 2023-09-27 ENCOUNTER — Other Ambulatory Visit (HOSPITAL_COMMUNITY): Payer: Self-pay

## 2023-09-27 VITALS — BP 150/82 | HR 66 | Temp 97.7°F | Ht 66.0 in | Wt 235.0 lb

## 2023-09-27 DIAGNOSIS — E785 Hyperlipidemia, unspecified: Secondary | ICD-10-CM | POA: Diagnosis not present

## 2023-09-27 DIAGNOSIS — E1165 Type 2 diabetes mellitus with hyperglycemia: Secondary | ICD-10-CM | POA: Diagnosis not present

## 2023-09-27 DIAGNOSIS — J0140 Acute pansinusitis, unspecified: Secondary | ICD-10-CM | POA: Diagnosis not present

## 2023-09-27 DIAGNOSIS — Z7984 Long term (current) use of oral hypoglycemic drugs: Secondary | ICD-10-CM

## 2023-09-27 DIAGNOSIS — E1159 Type 2 diabetes mellitus with other circulatory complications: Secondary | ICD-10-CM

## 2023-09-27 DIAGNOSIS — E1169 Type 2 diabetes mellitus with other specified complication: Secondary | ICD-10-CM | POA: Diagnosis not present

## 2023-09-27 DIAGNOSIS — Z7985 Long-term (current) use of injectable non-insulin antidiabetic drugs: Secondary | ICD-10-CM | POA: Diagnosis not present

## 2023-09-27 DIAGNOSIS — I152 Hypertension secondary to endocrine disorders: Secondary | ICD-10-CM | POA: Diagnosis not present

## 2023-09-27 LAB — COMPREHENSIVE METABOLIC PANEL
ALT: 21 U/L (ref 0–35)
AST: 16 U/L (ref 0–37)
Albumin: 4.2 g/dL (ref 3.5–5.2)
Alkaline Phosphatase: 76 U/L (ref 39–117)
BUN: 18 mg/dL (ref 6–23)
CO2: 31 meq/L (ref 19–32)
Calcium: 9.8 mg/dL (ref 8.4–10.5)
Chloride: 101 meq/L (ref 96–112)
Creatinine, Ser: 0.8 mg/dL (ref 0.40–1.20)
GFR: 84.57 mL/min (ref >60.00)
Glucose, Bld: 115 mg/dL — ABNORMAL HIGH (ref 70–99)
Potassium: 3.7 meq/L (ref 3.5–5.1)
Sodium: 141 meq/L (ref 135–145)
Total Bilirubin: 0.6 mg/dL (ref 0.2–1.2)
Total Protein: 7.4 g/dL (ref 6.0–8.3)

## 2023-09-27 LAB — HEMOGLOBIN A1C: Hgb A1c MFr Bld: 7.5 % — ABNORMAL HIGH (ref 4.6–6.5)

## 2023-09-27 LAB — LDL CHOLESTEROL, DIRECT: Direct LDL: 95 mg/dL

## 2023-09-27 MED ORDER — RYBELSUS 3 MG PO TABS
3.0000 mg | ORAL_TABLET | Freq: Every day | ORAL | 5 refills | Status: DC
Start: 2023-09-27 — End: 2024-05-04
  Filled 2023-09-27: qty 30, 30d supply, fill #0

## 2023-09-27 MED ORDER — AMOXICILLIN-POT CLAVULANATE 875-125 MG PO TABS
1.0000 | ORAL_TABLET | Freq: Two times a day (BID) | ORAL | 0 refills | Status: DC
Start: 2023-09-27 — End: 2024-05-01
  Filled 2023-09-27: qty 20, 10d supply, fill #0

## 2023-09-27 MED ORDER — CETIRIZINE HCL 10 MG PO TABS
10.0000 mg | ORAL_TABLET | Freq: Every day | ORAL | Status: AC
Start: 2023-09-27 — End: ?

## 2023-09-27 MED ORDER — FLUTICASONE PROPIONATE 50 MCG/ACT NA SUSP
2.0000 | Freq: Every day | NASAL | 0 refills | Status: AC
Start: 2023-09-27 — End: ?
  Filled 2023-09-27: qty 16, 30d supply, fill #0

## 2023-09-27 MED ORDER — VALSARTAN-HYDROCHLOROTHIAZIDE 160-25 MG PO TABS
1.0000 | ORAL_TABLET | Freq: Every day | ORAL | 3 refills | Status: DC
Start: 2023-09-27 — End: 2023-09-27
  Filled 2023-09-27: qty 90, 90d supply, fill #0

## 2023-09-27 MED ORDER — VALSARTAN-HYDROCHLOROTHIAZIDE 160-25 MG PO TABS
1.0000 | ORAL_TABLET | Freq: Every day | ORAL | 1 refills | Status: DC
  Filled 2023-10-31 – 2023-12-29 (×2): qty 90, 90d supply, fill #0
  Filled 2024-03-30: qty 90, 90d supply, fill #1

## 2023-09-27 NOTE — Progress Notes (Signed)
 Established Patient Visit  Patient: Christina Schwartz   DOB: 19-Jan-1971   53 y.o. Female  MRN: [id number] Visit Date: 09/27/2023  Subjective:    Chief Complaint  Patient presents with   Sleep concerns/issues     Was sick over a month ago still having what feels like congestion in throat and has caused for sleep apnea events to increase    Sinusitis This is a new problem. The current episode started 1 to 4 weeks ago. The problem is unchanged. There has been no fever. Associated symptoms include congestion, headaches and sinus pressure. Pertinent negatives include no chills, coughing, diaphoresis, ear pain, sneezing, sore throat or swollen glands. Treatments tried: saline and benadryl. The treatment provided no relief.  Difficulty with sleep and use of CPAP due to sinus congestion.  Type 2 diabetes mellitus with hyperglycemia, without long-term current use of insulin (HCC) Calculated UACr: 29.21mg /g Current use of metformin 750mg  XR Has upcoming appointment for DIABETES eye exam Fasting glucose 120-150 BP not at goal LDL not at goal, current use of atorvastatin.  Repeat BMP, HgbA1c, and direct LDL   Dyslipidemia associated with type 2 diabetes mellitus (HCC) Repeat direct LDL Maintain atorvastatin dose  Hypertension associated with diabetes (HCC) Bp not at goal Current use of atenolol and hydrochlorothiazide daily Unable to tolerate amlodipine 5mg : diarrhea and ABDOMEN pain BP Readings from Last 3 Encounters:  09/27/23 (!) 150/82  07/15/23 (!) 176/106  06/04/23 (!) 148/90    Advised about importance of DASH diet Stop hydrochlorothiazide 25mg  Start valsartan/hydrochlorothiazide 160/25mg  Repeat bmp F/up in 87month  Reviewed medical, surgical, and social history today  Medications: Outpatient Medications Prior to Visit  Medication Sig Note   atenolol (TENORMIN) 50 MG tablet Take 1 tablet (50 mg total) by mouth daily.    atorvastatin (LIPITOR) 20 MG tablet  Take 1 tablet (20 mg total) by mouth daily.    Blood Glucose Monitoring Suppl (FREESTYLE LITE) DEVI     FREESTYLE LITE test strip Check glucose once a day in the morning    Iron-Vitamins (GERITOL PO) Take 1 tablet by mouth daily.    Lancets (FREESTYLE) lancets     meclizine (ANTIVERT) 25 MG tablet Take 1 tablet (25 mg total) by mouth 3 (three) times daily as needed for dizziness.    metFORMIN (GLUCOPHAGE-XR) 750 MG 24 hr tablet Take 1 tablet (750 mg total) by mouth daily with breakfast.    [DISCONTINUED] hydrochlorothiazide (HYDRODIURIL) 25 MG tablet Take 1 tablet (25 mg total) by mouth daily.    [DISCONTINUED] amLODipine (NORVASC) 5 MG tablet Take 1 tablet (5 mg total) by mouth every evening. (Patient not taking: Reported on 09/27/2023) 09/27/2023: ABD pain and diarrhea   No facility-administered medications prior to visit.   Reviewed past medical and social history.   ROS per HPI above      Objective:  BP (!) 150/82 (BP Location: Left Arm, Patient Position: Sitting, Cuff Size: Normal)   Pulse 66   Temp 97.7 F (36.5 C) (Temporal)   Ht 5\' 6"  (1.676 m)   Wt 235 lb (106.6 kg)   LMP 07/24/2020   SpO2 96%   BMI 37.93 kg/m      Physical Exam Vitals and nursing note reviewed.  HENT:     Nose: Mucosal edema and congestion present.     Right Nostril: Occlusion present.     Left Nostril: Occlusion present.     Right  Turbinates: Swollen.     Left Turbinates: Swollen.     Right Sinus: Maxillary sinus tenderness and frontal sinus tenderness present.     Left Sinus: Maxillary sinus tenderness and frontal sinus tenderness present.  Cardiovascular:     Rate and Rhythm: Normal rate and regular rhythm.     Pulses: Normal pulses.     Heart sounds: Normal heart sounds.  Pulmonary:     Effort: Pulmonary effort is normal.     Breath sounds: Normal breath sounds.  Musculoskeletal:     Right lower leg: Edema present.     Left lower leg: Edema present.  Neurological:     Mental Status:  She is alert and oriented to person, place, and time.     No results found for any visits on 09/27/23.    Assessment & Plan:    Problem List Items Addressed This Visit     Dyslipidemia associated with type 2 diabetes mellitus (HCC)   Repeat direct LDL Maintain atorvastatin dose      Relevant Medications   valsartan-hydrochlorothiazide (DIOVAN-HCT) 160-25 MG tablet   Other Relevant Orders   Direct LDL   Comprehensive metabolic panel   Hypertension associated with diabetes (HCC)   Bp not at goal Current use of atenolol and hydrochlorothiazide daily Unable to tolerate amlodipine 5mg : diarrhea and ABDOMEN pain BP Readings from Last 3 Encounters:  09/27/23 (!) 150/82  07/15/23 (!) 176/106  06/04/23 (!) 148/90    Advised about importance of DASH diet Stop hydrochlorothiazide 25mg  Start valsartan/hydrochlorothiazide 160/25mg  Repeat bmp F/up in 43month      Relevant Medications   valsartan-hydrochlorothiazide (DIOVAN-HCT) 160-25 MG tablet   Other Relevant Orders   Comprehensive metabolic panel   Type 2 diabetes mellitus with hyperglycemia, without long-term current use of insulin (HCC) - Primary   Calculated UACr: 29.21mg /g Current use of metformin 750mg  XR Has upcoming appointment for DIABETES eye exam Fasting glucose 120-150 BP not at goal LDL not at goal, current use of atorvastatin.  Repeat BMP, HgbA1c, and direct LDL       Relevant Medications   valsartan-hydrochlorothiazide (DIOVAN-HCT) 160-25 MG tablet   Other Relevant Orders   Hemoglobin A1c   Comprehensive metabolic panel   Other Visit Diagnoses       Acute non-recurrent pansinusitis       Relevant Medications   amoxicillin-clavulanate (AUGMENTIN) 875-125 MG tablet   fluticasone (FLONASE) 50 MCG/ACT nasal spray   cetirizine (ZYRTEC) 10 MG tablet      Return in about 4 weeks (around 10/25/2023) for HTN.     Alysia Penna, NP

## 2023-09-27 NOTE — Assessment & Plan Note (Addendum)
 Repeat direct LDL: not at goal Maintain atorvastatin dose Encouraged to maintain heeart healthy diet and daily exercise Repeat labs in 3months

## 2023-09-27 NOTE — Assessment & Plan Note (Addendum)
 Bp not at goal Current use of atenolol and hydrochlorothiazide daily Unable to tolerate amlodipine 5mg : diarrhea and ABDOMEN pain BP Readings from Last 3 Encounters:  09/27/23 (!) 150/82  07/15/23 (!) 176/106  06/04/23 (!) 148/90    Advised about importance of DASH diet Stop hydrochlorothiazide 25mg  Start valsartan/hydrochlorothiazide 160/25mg  Repeat bmp F/up in 54month

## 2023-09-27 NOTE — Patient Instructions (Signed)
 Go to lab Stop hydrochlorothiazide Start valsartan/hydrochlorothiazide 1tab daily Maintain low salt diet

## 2023-09-27 NOTE — Assessment & Plan Note (Addendum)
 Calculated UACr: 29.21mg /g Current use of metformin 750mg  XR Has upcoming appointment for DIABETES eye exam Fasting glucose 120-150 BP not at goal LDL not at goal, current use of atorvastatin.  Repeat BMP, HgbA1c, and direct LDL: Stable hgbA1c at 7.5% and LDL not at goal.  Added rybelsus 1tab daily. F/up in 3months

## 2023-09-27 NOTE — Addendum Note (Signed)
 Addended by: Michaela Corner on: 09/27/2023 06:50 PM   Modules accepted: Orders

## 2023-09-28 ENCOUNTER — Other Ambulatory Visit (HOSPITAL_COMMUNITY): Payer: Self-pay

## 2023-10-10 ENCOUNTER — Other Ambulatory Visit (HOSPITAL_COMMUNITY): Payer: Self-pay

## 2023-10-31 ENCOUNTER — Other Ambulatory Visit (HOSPITAL_COMMUNITY): Payer: Self-pay

## 2023-10-31 ENCOUNTER — Ambulatory Visit: Admitting: Nurse Practitioner

## 2023-11-13 ENCOUNTER — Other Ambulatory Visit (HOSPITAL_COMMUNITY): Payer: Self-pay

## 2023-12-07 ENCOUNTER — Ambulatory Visit: Admitting: Internal Medicine

## 2023-12-07 ENCOUNTER — Encounter: Payer: Self-pay | Admitting: Internal Medicine

## 2023-12-07 VITALS — BP 142/90 | HR 79 | Temp 98.0°F | Resp 20 | Ht 66.0 in | Wt 235.0 lb

## 2023-12-07 DIAGNOSIS — R0602 Shortness of breath: Secondary | ICD-10-CM | POA: Diagnosis not present

## 2023-12-07 DIAGNOSIS — R051 Acute cough: Secondary | ICD-10-CM | POA: Diagnosis not present

## 2023-12-07 DIAGNOSIS — R0982 Postnasal drip: Secondary | ICD-10-CM | POA: Diagnosis not present

## 2023-12-07 NOTE — Progress Notes (Signed)
 NEW PATIENT Date of Service/Encounter:  12/07/23 Referring provider: Kandace Organ, NP Primary care provider: Kandace Organ, NP  Subjective:  Christina Schwartz is a 53 y.o. female with a PMHx of OSA on CPAP, thyroid  nodule, DMII, HTN, dyslipidemia, presenting today for evaluation of acute onset cough. History obtained from: chart review and patient.   Discussed the use of AI scribe software for clinical note transcription with the patient, who gave verbal consent to proceed.  History of Present Illness   Christina Schwartz is a 53 year old female who presents with a cough for the past three days.  She has been experiencing what she describes as a wet cough for the past three days, severe enough to disrupt her sleep. The frequency of the cough increased this morning compared to previous days. She experienced shortness of breath while walking across the street this morning but does not feel short of breath currently. She does associate significant drainage that started with the cough but denies other sick symptoms or contacts.   No history of asthma, wheezing, or similar episodes. She denies fever, runny nose, sore throat, or allergies. She is planning to travel next Thursday to care for her mother post-surgery. She does not want to get her sick.  Her current medications include atenolol  and metformin . No history of sinus infections, eczema, food allergies, or medication allergies.      Chart Review:  Reviewed PCP notes from 09/27/23: prescribed augmentin , flonase  and zyrtec  for pansinsusitis.  Other allergy screening: Asthma: no Rhino conjunctivitis: no Food allergy: no Medication allergy: no Hymenoptera allergy: no Urticaria: no Eczema:no History of recurrent infections suggestive of immunodeficency: no Vaccinations are up to date.   Past Medical History: Past Medical History:  Diagnosis Date   Anemia    Heart murmur    Hypertension    Kidney stone    Lung  nodule    Vertigo    Medication List:  Current Outpatient Medications  Medication Sig Dispense Refill   amoxicillin -clavulanate (AUGMENTIN ) 875-125 MG tablet Take 1 tablet by mouth 2 (two) times daily. 20 tablet 0   atenolol  (TENORMIN ) 50 MG tablet Take 1 tablet (50 mg total) by mouth daily. 90 tablet 3   atorvastatin  (LIPITOR) 20 MG tablet Take 1 tablet (20 mg total) by mouth daily. 90 tablet 3   Blood Glucose Monitoring Suppl (FREESTYLE LITE) DEVI      cetirizine  (ZYRTEC ) 10 MG tablet Take 1 tablet (10 mg total) by mouth daily.     fluticasone  (FLONASE ) 50 MCG/ACT nasal spray Place 2 sprays into both nostrils daily. 16 g 0   FREESTYLE LITE test strip Check glucose once a day in the morning 100 each 3   Iron-Vitamins (GERITOL PO) Take 1 tablet by mouth daily.     Lancets (FREESTYLE) lancets      meclizine  (ANTIVERT ) 25 MG tablet Take 1 tablet (25 mg total) by mouth 3 (three) times daily as needed for dizziness. 30 tablet 0   metFORMIN  (GLUCOPHAGE -XR) 750 MG 24 hr tablet Take 1 tablet (750 mg total) by mouth daily with breakfast. 90 tablet 3   Semaglutide  (RYBELSUS ) 3 MG TABS Take 1 tablet (3 mg total) by mouth daily. 30 tablet 5   valsartan -hydrochlorothiazide  (DIOVAN -HCT) 160-25 MG tablet Take 1 tablet by mouth daily. Discontinue hydrochlorothiazide . 90 tablet 1   No current facility-administered medications for this visit.   Known Allergies:  Allergies  Allergen Reactions   Amlodipine  Other (See Comments)  Headache and dizziness   Lisinopril  Cough   Past Surgical History: Past Surgical History:  Procedure Laterality Date   TUBAL LIGATION     WISDOM TOOTH EXTRACTION     Family History: Family History  Problem Relation Age of Onset   Hypertension Father    Pulmonary embolism Father    Diabetes Maternal Grandmother    Diabetes Sister    Lymphoma Sister    Social History: Christina Schwartz lives at home without pets, with her son, no smoke exposure.   ROS:  All other systems  negative except as noted per HPI.  Objective:  Blood pressure (!) 142/90, pulse 79, temperature 98 F (36.7 C), temperature source Oral, resp. rate 20, height 5\' 6"  (1.676 m), weight 235 lb (106.6 kg), last menstrual period 07/24/2020, SpO2 97%. Body mass index is 37.93 kg/m. Physical Exam:  General Appearance:  Alert, cooperative, no distress, appears stated age  Head:  Normocephalic, without obvious abnormality, atraumatic  Eyes:  Conjunctiva clear, EOM's intact  Ears EACs normal bilaterally and normal TMs bilaterally  Nose: Nares normal, hypertrophic turbinates, normal mucosa, no visible anterior polyps, and septum midline  Throat: Lips, tongue normal; teeth and gums normal, normal posterior oropharynx  Neck: Supple, symmetrical  Lungs:   clear to auscultation bilaterally, Respirations unlabored, intermittent dry coughing  Heart:  regular rate and rhythm and no murmur, Appears well perfused  Extremities: No edema  Skin: Skin color, texture, turgor normal and no rashes or lesions on visualized portions of skin  Neurologic: No gross deficits   Spirometry:  Tracings reviewed. Her effort: It was hard to get consistent efforts and there is a question as to whether this reflects a maximal maneuver. Not reproducible, poor effort. FVC: 1.87L  FEV1: 1.55L, 52% predicted FEV1/FVC ratio: 0.83  Interpretation: Spirometry consistent with possible restrictive disease. Hard to interpret Please see scanned spirometry results for details.  Labs:  Lab Orders  No laboratory test(s) ordered today     Assessment and Plan  Assessment and Plan Cough-suspected related to postnasal drip Acute dry nocturnal cough for three days, no asthma or allergies, clear lung auscultation. No other sick symptoms or contacts. Some shortness of breath. - Perform spirometry to assess for obstruction. - Breztri 2 puffs twice daily x 1-2 weeks (use sample)-stop if not helpful -use xhance  nasal spray 1 spray each  nostril twice daily-this will help with drainage - consider delysm cough syrup 10 mL twice daily as needed -mucinex 600 mg twice daily as needed (take with lots of water) - Consider azithromycin if symptoms do not improve by Monday. (Possible sinus infection)  Type 2 diabetes mellitus Metformin  listed among current medications. Consideration of steroid treatment's impact on blood glucose levels.  Essential (primary) hypertension Atenolol  listed among current medications.  Follow up : as needed Thank you for allowing me to participate in your care.  Jonathon Neighbors, MD [facility]   This note in its entirety was forwarded to the Provider who requested this consultation.  Other: none  Thank you for your kind referral. I appreciate the opportunity to take part in Christina Schwartz's care. Please do not hesitate to contact me with questions.  Sincerely,  Jonathon Neighbors, MD [facility] 

## 2023-12-07 NOTE — Patient Instructions (Addendum)
 Cough-suspected related to postnasal drip Acute dry nocturnal cough for three days, no asthma or allergies, clear lung auscultation. No other sick symptoms or contacts. Some shortness of breath. - Perform spirometry to assess for obstruction. - Breztri 2 puffs twice daily x 1-2 weeks (use sample)-stop if not helpful -use xhance  nasal spray 1 spray each nostril twice daily-this will help with drainage - consider delysm cough syrup 10 mL twice daily as needed -mucinex 600 mg twice daily as needed (take with lots of water) - Consider azithromycin if symptoms do not improve by Monday.  Type 2 diabetes mellitus Metformin  listed among current medications. Consideration of steroid treatment's impact on blood glucose levels.  Essential (primary) hypertension Atenolol  listed among current medications.

## 2023-12-28 ENCOUNTER — Ambulatory Visit: Admitting: Radiology

## 2023-12-29 ENCOUNTER — Other Ambulatory Visit (HOSPITAL_BASED_OUTPATIENT_CLINIC_OR_DEPARTMENT_OTHER): Payer: Self-pay

## 2023-12-31 ENCOUNTER — Ambulatory Visit

## 2023-12-31 ENCOUNTER — Ambulatory Visit: Payer: Self-pay | Admitting: Nurse Practitioner

## 2023-12-31 ENCOUNTER — Ambulatory Visit
Admission: RE | Admit: 2023-12-31 | Discharge: 2023-12-31 | Disposition: A | Discharge status: Home / Self Care | Source: Ambulatory Visit | Attending: Nurse Practitioner | Admitting: Nurse Practitioner

## 2023-12-31 DIAGNOSIS — N6320 Unspecified lump in the left breast, unspecified quadrant: Secondary | ICD-10-CM

## 2023-12-31 DIAGNOSIS — N6325 Unspecified lump in the left breast, overlapping quadrants: Secondary | ICD-10-CM | POA: Diagnosis not present

## 2024-01-28 ENCOUNTER — Ambulatory Visit (INDEPENDENT_AMBULATORY_CARE_PROVIDER_SITE_OTHER): Admitting: Radiology

## 2024-01-28 ENCOUNTER — Encounter: Payer: Self-pay | Admitting: Radiology

## 2024-01-28 VITALS — BP 116/74 | HR 71 | Ht 65.0 in | Wt 234.0 lb

## 2024-01-28 DIAGNOSIS — Z01419 Encounter for gynecological examination (general) (routine) without abnormal findings: Secondary | ICD-10-CM

## 2024-01-28 DIAGNOSIS — Z1331 Encounter for screening for depression: Secondary | ICD-10-CM

## 2024-01-28 NOTE — Patient Instructions (Signed)
 Preventive Care 16-53 Years Old, Female  Preventive care refers to lifestyle choices and visits with your health care provider that can promote health and wellness. Preventive care visits are also called wellness exams.  What can I expect for my preventive care visit?  Counseling  Your health care provider may ask you questions about your:  Medical history, including:  Past medical problems.  Family medical history.  Pregnancy history.  Current health, including:  Menstrual cycle.  Method of birth control.  Emotional well-being.  Home life and relationship well-being.  Sexual activity and sexual health.  Lifestyle, including:  Alcohol, nicotine or tobacco, and drug use.  Access to firearms.  Diet, exercise, and sleep habits.  Work and work Astronomer.  Sunscreen use.  Safety issues such as seatbelt and bike helmet use.  Physical exam  Your health care provider will check your:  Height and weight. These may be used to calculate your BMI (body mass index). BMI is a measurement that tells if you are at a healthy weight.  Waist circumference. This measures the distance around your waistline. This measurement also tells if you are at a healthy weight and may help predict your risk of certain diseases, such as type 2 diabetes and high blood pressure.  Heart rate and blood pressure.  Body temperature.  Skin for abnormal spots.  What immunizations do I need?    Vaccines are usually given at various ages, according to a schedule. Your health care provider will recommend vaccines for you based on your age, medical history, and lifestyle or other factors, such as travel or where you work.  What tests do I need?  Screening  Your health care provider may recommend screening tests for certain conditions. This may include:  Lipid and cholesterol levels.  Diabetes screening. This is done by checking your blood sugar (glucose) after you have not eaten for a while (fasting).  Pelvic exam and Pap test.  Hepatitis B test.  Hepatitis C  test.  HIV (human immunodeficiency virus) test.  STI (sexually transmitted infection) testing, if you are at risk.  Lung cancer screening.  Colorectal cancer screening.  Mammogram. Talk with your health care provider about when you should start having regular mammograms. This may depend on whether you have a family history of breast cancer.  BRCA-related cancer screening. This may be done if you have a family history of breast, ovarian, tubal, or peritoneal cancers.  Bone density scan. This is done to screen for osteoporosis.  Talk with your health care provider about your test results, treatment options, and if necessary, the need for more tests.  Follow these instructions at home:  Eating and drinking    Eat a diet that includes fresh fruits and vegetables, whole grains, lean protein, and low-fat dairy products.  Take vitamin and mineral supplements as recommended by your health care provider.  Do not drink alcohol if:  Your health care provider tells you not to drink.  You are pregnant, may be pregnant, or are planning to become pregnant.  If you drink alcohol:  Limit how much you have to 0-1 drink a day.  Know how much alcohol is in your drink. In the [place] one drink equals one 12 oz bottle of beer (355 mL), one 5 oz glass of wine (148 mL), or one 1 oz glass of hard liquor (44 mL).  Lifestyle  Brush your teeth every morning and night with fluoride toothpaste. Floss one time each day.  Exercise for at least  30 minutes 5 or more days each week.  Do not use any products that contain nicotine or tobacco. These products include cigarettes, chewing tobacco, and vaping devices, such as e-cigarettes. If you need help quitting, ask your health care provider.  Do not use drugs.  If you are sexually active, practice safe sex. Use a condom or other form of protection to prevent STIs.  If you do not wish to become pregnant, use a form of birth control. If you plan to become pregnant, see your health care provider for a  prepregnancy visit.  Take aspirin only as told by your health care provider. Make sure that you understand how much to take and what form to take. Work with your health care provider to find out whether it is safe and beneficial for you to take aspirin daily.  Find healthy ways to manage stress, such as:  Meditation, yoga, or listening to music.  Journaling.  Talking to a trusted person.  Spending time with friends and family.  Minimize exposure to UV radiation to reduce your risk of skin cancer.  Safety  Always wear your seat belt while driving or riding in a vehicle.  Do not drive:  If you have been drinking alcohol. Do not ride with someone who has been drinking.  When you are tired or distracted.  While texting.  If you have been using any mind-altering substances or drugs.  Wear a helmet and other protective equipment during sports activities.  If you have firearms in your house, make sure you follow all gun safety procedures.  Seek help if you have been physically or sexually abused.  What's next?  Visit your health care provider once a year for an annual wellness visit.  Ask your health care provider how often you should have your eyes and teeth checked.  Stay up to date on all vaccines.  This information is not intended to replace advice given to you by your health care provider. Make sure you discuss any questions you have with your health care provider.  Document Revised: 12/22/2020 Document Reviewed: 12/22/2020  Elsevier Patient Education  2024 [employer].

## 2024-01-28 NOTE — Progress Notes (Signed)
   Christina Schwartz June 02, 1971 [id number]   History: Postmenopausal 53 y.o. presents for annual exam. No gyn concerns.   Gynecologic History Postmenopausal Last Pap: 3/23. Results were: normal Last mammogram: 12/31/23. Results were: normal Last colonoscopy: cologuard 2024 negative  Obstetric History OB History  Gravida Para Term Preterm AB Living  4 4 4   4   SAB IAB Ectopic Multiple Live Births          # Outcome Date GA Lbr Len/2nd Weight Sex Type Anes PTL Lv  4 Term           3 Term           2 Term           1 Term                01/28/2024    1:18 PM 06/04/2023    8:48 AM 05/07/2023    8:05 AM  Depression screen PHQ 2/9  Decreased Interest 0 0 0  Down, Depressed, Hopeless 0 0 0  PHQ - 2 Score 0 0 0     The following portions of the patient's history were reviewed and updated as appropriate: allergies, current medications, past family history, past medical history, past social history, past surgical history, and problem list.  Review of Systems Pertinent items noted in HPI and remainder of comprehensive ROS otherwise negative.  Past medical history, past surgical history, family history and social history were all reviewed and documented in the EPIC chart.  Exam:  Vitals:   01/28/24 1328  BP: 116/74  Pulse: 71  SpO2: 99%  Weight: 234 lb (106.1 kg)  Height: 5' 5 (1.651 m)   Body mass index is 38.94 kg/m.  General appearance:  Normal Thyroid :  Symmetrical, normal in size, without palpable masses or nodularity. Respiratory  Auscultation:  Clear without wheezing or rhonchi Cardiovascular  Auscultation:  Regular rate, without rubs, murmurs or gallops  Edema/varicosities:  Not grossly evident Abdominal  Soft,nontender, without masses, guarding or rebound.  Liver/spleen:  No organomegaly noted  Hernia:  None appreciated  Skin  Inspection:  Grossly normal Breasts: Examined lying and sitting.   Right: Without masses, retractions, nipple discharge or  axillary adenopathy.   Left: Without masses, retractions, nipple discharge or axillary adenopathy. Genitourinary   Inguinal/mons:  Normal without inguinal adenopathy  External genitalia:  Normal appearing vulva with no masses, tenderness, or lesions  BUS/Urethra/Skene's glands:  Normal  Vagina:  Normal appearing with normal color and discharge, no lesions. Atrophy: mild   Cervix:  Normal appearing without discharge or lesions  Uterus:  Normal in size, shape and contour.  Midline and mobile, nontender  Adnexa/parametria:     Rt: Normal in size, without masses or tenderness.   Lt: Normal in size, without masses or tenderness.  Anus and perineum: Normal    Darice Hoit, CMA present for exam  Assessment/Plan:   1. Well woman exam with routine gynecological exam (Primary) Pap 2026 Mammo yearly Cologuard 2027 Labs with PCP  2. Depression screen negative     Return in 1 year for annual or sooner prn.  Shatha Hooser B WHNP-BC, 1:40 PM 01/28/2024

## 2024-01-30 DIAGNOSIS — G4733 Obstructive sleep apnea (adult) (pediatric): Secondary | ICD-10-CM | POA: Diagnosis not present

## 2024-02-04 ENCOUNTER — Other Ambulatory Visit (HOSPITAL_COMMUNITY): Payer: Self-pay

## 2024-02-04 ENCOUNTER — Other Ambulatory Visit: Payer: Self-pay

## 2024-02-16 ENCOUNTER — Ambulatory Visit: Admission: EM | Admit: 2024-02-16 | Discharge: 2024-02-16 | Disposition: A | Discharge status: Home / Self Care

## 2024-02-16 ENCOUNTER — Other Ambulatory Visit: Payer: Self-pay

## 2024-02-16 DIAGNOSIS — I10 Essential (primary) hypertension: Secondary | ICD-10-CM | POA: Diagnosis not present

## 2024-02-16 NOTE — ED Triage Notes (Addendum)
 Pt presents with a chief complaint of hypertension. States she is on two blood pressure medications. Felt very tired this morning. Took BP PTA and it was 173/97. Pt currently denies pain. BP in triage is 144/82. Denies SOB and chest pain.  Increased her fluid intake this morning and felt better. Valsartan  taken a little after 0800. Atenolol  taken a little after 1100.

## 2024-02-16 NOTE — ED Provider Notes (Signed)
 [facility]    CSN: [id number] Arrival date & time: 02/16/24  1336      History   Chief Complaint Chief Complaint  Patient presents with   Hypertension    HPI Christina Schwartz is a 53 y.o. female.   HPI  Pt presents today with concerns for elevated BP  She states she started feeling bad this AM and checked her BP around 8 AM after she returned from walking  She states it was 168/84 at 9AM - reviewing BP logs from her monitor it appears she was checking BP every 10 minutes or so after this with variable results from 140s-170s/70-110s until around 11 AM  She states she took her Valsartan - hydrochlorothiazide  around 9 AM and then took Atenolol  at 11AM She denies vision changes, SOB, chest pain, headaches She states she started to drink water and started to feel better  She states her normal BP is variable from 130s/80s   She states today was the first time she has felt like her BP was out of control    Past Medical History:  Diagnosis Date   Anemia    Heart murmur    Hypertension    Kidney stone    Lung nodule    Vertigo     Patient Active Problem List   Diagnosis Date Noted   Goiter diffuse 05/07/2023   Long term (current) use of oral hypoglycemic drugs 05/07/2023   History of kidney stones 07/28/2022   Pulmonary nodule 07/28/2022   Morbid obesity (HCC) 07/28/2022   Venous insufficiency 04/22/2021   Type 2 diabetes mellitus with hyperglycemia, without long-term current use of insulin (HCC) 12/09/2020   Sleep apnea 02/05/2019   Dyslipidemia associated with type 2 diabetes mellitus (HCC) 06/09/2018   Hypertension associated with diabetes (HCC) 08/18/2016   Iron deficiency anemia due to chronic blood loss 08/18/2016   Fibroids 05/08/2012   Menorrhagia 04/15/2012    Past Surgical History:  Procedure Laterality Date   TUBAL LIGATION     WISDOM TOOTH EXTRACTION      OB History     Gravida  4   Para  4   Term  4   Preterm      AB       Living  4      SAB      IAB      Ectopic      Multiple      Live Births               Home Medications    Prior to Admission medications   Medication Sig Start Date End Date Taking? Authorizing Provider  atenolol  (TENORMIN ) 50 MG tablet Take 1 tablet (50 mg total) by mouth daily. 05/07/23  Yes Nche, Roselie Rockford, NP  valsartan -hydrochlorothiazide  (DIOVAN -HCT) 160-25 MG tablet Take 1 tablet by mouth daily. Discontinue hydrochlorothiazide . 09/27/23  Yes Nche, Roselie Rockford, NP  amoxicillin -clavulanate (AUGMENTIN ) 875-125 MG tablet Take 1 tablet by mouth 2 (two) times daily. Patient not taking: Reported on 01/28/2024 09/27/23   Nche, Roselie Rockford, NP  atorvastatin  (LIPITOR) 20 MG tablet Take 1 tablet (20 mg total) by mouth daily. 05/07/23   Nche, Roselie Rockford, NP  Blood Glucose Monitoring Suppl (FREESTYLE LITE) DEVI  09/19/19   [provider]  cetirizine  (ZYRTEC ) 10 MG tablet Take 1 tablet (10 mg total) by mouth daily. 09/27/23   Nche, Roselie Rockford, NP  fluticasone  (FLONASE ) 50 MCG/ACT nasal spray Place 2 sprays into both nostrils daily.  09/27/23   NcheRoselie Rockford, NP  FREESTYLE LITE test strip Check glucose once a day in the morning 02/16/21   Nche, Roselie Rockford, NP  Iron-Vitamins (GERITOL PO) Take 1 tablet by mouth daily.    [provider]  Lancets (FREESTYLE) lancets  09/19/19   [provider]  meclizine  (ANTIVERT ) 25 MG tablet Take 1 tablet (25 mg total) by mouth 3 (three) times daily as needed for dizziness. 07/19/22   Thedora Garnette HERO, MD  metFORMIN  (GLUCOPHAGE -XR) 750 MG 24 hr tablet Take 1 tablet (750 mg total) by mouth daily with breakfast. 05/07/23   Nche, Roselie Rockford, NP  Semaglutide  (RYBELSUS ) 3 MG TABS Take 1 tablet (3 mg total) by mouth daily. Patient not taking: Reported on 01/28/2024 09/27/23   Nche, Roselie Rockford, NP    Family History Family History  Problem Relation Age of Onset   Hypertension Father    Pulmonary embolism Father     Diabetes Maternal Grandmother    Diabetes Sister    Lymphoma Sister     Social History Social History   Tobacco Use   Smoking status: Never   Smokeless tobacco: Never  Vaping Use   Vaping status: Never Used  Substance Use Topics   Alcohol use: No   Drug use: No     Allergies   Amlodipine  and Lisinopril    Review of Systems Review of Systems  Eyes:  Negative for visual disturbance.  Respiratory:  Negative for chest tightness, shortness of breath and wheezing.   Cardiovascular:  Negative for chest pain and palpitations.  Gastrointestinal:  Negative for diarrhea, nausea and vomiting.  Neurological:  Negative for headaches.     Physical Exam Triage Vital Signs ED Triage Vitals  Encounter Vitals Group     BP 02/16/24 1351 (!) 144/82     Girls Systolic BP Percentile --      Girls Diastolic BP Percentile --      Boys Systolic BP Percentile --      Boys Diastolic BP Percentile --      Pulse Rate 02/16/24 1351 68     Resp 02/16/24 1351 18     Temp 02/16/24 1351 98.4 F (36.9 C)     Temp Source 02/16/24 1351 Oral     SpO2 02/16/24 1351 97 %     Weight 02/16/24 1351 232 lb (105.2 kg)     Height 02/16/24 1351 5' 5 (1.651 m)     Head Circumference --      Peak Flow --      Pain Score 02/16/24 1424 0     Pain Loc --      Pain Education --      Exclude from Growth Chart --    No data found.  Updated Vital Signs BP (!) 144/82 (BP Location: Right Arm)   Pulse 68   Temp 98.4 F (36.9 C) (Oral)   Resp 18   Ht 5' 5 (1.651 m)   Wt 232 lb (105.2 kg)   LMP 07/24/2020   SpO2 97%   BMI 38.61 kg/m   Visual Acuity Right Eye Distance:   Left Eye Distance:   Bilateral Distance:    Right Eye Near:   Left Eye Near:    Bilateral Near:     Physical Exam Vitals reviewed.  Constitutional:      General: She is awake. She is not in acute distress.    Appearance: Normal appearance. She is well-developed and well-groomed. She is not ill-appearing or  toxic-appearing.   HENT:     Head: Normocephalic and atraumatic.  Eyes:     General: Lids are normal. Gaze aligned appropriately.     Extraocular Movements: Extraocular movements intact.     Conjunctiva/sclera: Conjunctivae normal.  Cardiovascular:     Rate and Rhythm: Normal rate and regular rhythm.     Heart sounds: Murmur heard.     Systolic murmur is present with a grade of 2/6.     No friction rub. No gallop.  Pulmonary:     Effort: Pulmonary effort is normal.     Breath sounds: Normal breath sounds. No decreased air movement. No decreased breath sounds, wheezing, rhonchi or rales.  Neurological:     Mental Status: She is alert and oriented to person, place, and time.  Psychiatric:        Attention and Perception: Attention and perception normal.        Mood and Affect: Mood and affect normal.        Speech: Speech normal.        Behavior: Behavior normal. Behavior is cooperative.      UC Treatments / Results  Labs (all labs ordered are listed, but only abnormal results are displayed) Labs Reviewed - No data to display  EKG   Radiology No results found.  Procedures Procedures (including critical care time)  Medications Ordered in UC Medications - No data to display  Initial Impression / Assessment and Plan / UC Course  I have reviewed the triage vital signs and the nursing notes.  Pertinent labs & imaging results that were available during my care of the patient were reviewed by me and considered in my medical decision making (see chart for details).      Final Clinical Impressions(s) / UC Diagnoses   Final diagnoses:  Primary hypertension   Patient presents today with concerns for elevated blood pressure reading that occurred earlier this morning.  She states that when she returned from her walking she started to feel bad and took her blood pressure and it was mildly elevated.  Review of her blood pressure monitor results demonstrates that she remeasured her blood pressure  every 5 to 10 minutes for about 3 hours resulting in variable blood pressures as well as high readings.  Vitals in clinic are reassuring with mildly elevated BP.  She denies red flag symptoms such as chest pain, shortness of breath, vision changes, headache that would be consistent with hypertensive emergency.  I reviewed with patient that her symptoms could have been from hypovolemia, recent exercise, overexertion, repeat blood pressure measures causing discomfort and increased anxiety.  Recommend she check blood pressure twice per day and record the readings so that she can review these with her PCP.  Recommend continued use of prescribed blood pressure medications as directed for optimal control.  Reviewed signs and symptoms of hypertensive urgency and emergency which were also provided in AVS.  Recommend follow-up with PCP if symptoms persist or progress.    Discharge Instructions      You were seen today for concerns of elevated blood pressure. At this time I suspect that your blood pressure was elevated this morning due to the fact that you had just returned from exercise, you may have been a bit low on fluids, you were taking your blood pressure back-to-back in succession.  All of these conditions can contribute to an elevated blood pressure.  Your BP was closer to normal range here in clinic and you are not experiencing  emergency signs or symptoms at this time.  For now I recommend that you continue to take your blood pressure medications as directed and check your blood pressure at home twice per day.  I recommend recording your blood pressure readings so that you can review them with your primary care provider at your next appointment. If you start getting readings over 185 systolic and over 110 diastolic and have the following symptoms please go to the ED as these can be signs of a hypertensive emergency: Chest pain, shortness of breath, vision changes, headache, difficulty urinating or blood in  the urine.     ED Prescriptions   None    PDMP not reviewed this encounter.   Marylene Rocky BRAVO, PA-C 02/16/24 [id number]

## 2024-02-16 NOTE — Discharge Instructions (Addendum)
 You were seen today for concerns of elevated blood pressure. At this time I suspect that your blood pressure was elevated this morning due to the fact that you had just returned from exercise, you may have been a bit low on fluids, you were taking your blood pressure back-to-back in succession.  All of these conditions can contribute to an elevated blood pressure.  Your BP was closer to normal range here in clinic and you are not experiencing emergency signs or symptoms at this time.  For now I recommend that you continue to take your blood pressure medications as directed and check your blood pressure at home twice per day.  I recommend recording your blood pressure readings so that you can review them with your primary care provider at your next appointment. If you start getting readings over 185 systolic and over 110 diastolic and have the following symptoms please go to the ED as these can be signs of a hypertensive emergency: Chest pain, shortness of breath, vision changes, headache, difficulty urinating or blood in the urine.

## 2024-02-19 ENCOUNTER — Ambulatory Visit: Admitting: Family Medicine

## 2024-02-19 ENCOUNTER — Ambulatory Visit: Payer: Self-pay

## 2024-02-19 VITALS — BP 134/84 | HR 75 | Temp 97.4°F | Resp 18 | Ht 65.0 in | Wt 232.8 lb

## 2024-02-19 VITALS — BP 134/84 | HR 75 | Temp 97.4°F | Ht 65.0 in | Wt 232.8 lb

## 2024-02-19 DIAGNOSIS — R42 Dizziness and giddiness: Secondary | ICD-10-CM

## 2024-02-19 DIAGNOSIS — E876 Hypokalemia: Secondary | ICD-10-CM | POA: Diagnosis not present

## 2024-02-19 NOTE — Telephone Encounter (Signed)
 FYI Only or Action Required?: Action required by provider: request for appointment.  Patient was last seen in primary care on 09/27/2023 by Nche, Roselie Rockford, NP.  Called Nurse Triage reporting Dizziness.  Symptoms began today.  Interventions attempted: Nothing.  Symptoms are: unchanged. Appointment today. Dizzy while moving and at rest.  Triage Disposition: See Physician Within 24 Hours  Patient/caregiver understands and will follow disposition?: Yes  Copied from CRM #[id number]. Topic: Clinical - Red Word Triage >> Feb 19, 2024 11:21 AM Robinson DEL wrote: [facility] that prompted transfer to Nurse Triage: Patient has an appointment scheduled but wanted patient to be triaged. Reason for Disposition  [1] MODERATE dizziness (e.g., interferes with normal activities) AND [2] has NOT been evaluated by doctor (or NP/PA) for this  (Exception: Dizziness caused by heat exposure, sudden standing, or poor fluid intake.)  Answer Assessment - Initial Assessment Questions 1. DESCRIPTION: Describe your dizziness.     Dizzy 2. LIGHTHEADED: Do you feel lightheaded? (e.g., somewhat faint, woozy, weak upon standing)     yes 3. VERTIGO: Do you feel like either you or the room is spinning or tilting? (i.e., vertigo)     no 4. SEVERITY: How bad is it?  Do you feel like you are going to faint? Can you stand and walk?     yes 5. ONSET:  When did the dizziness begin?     today 6. AGGRAVATING FACTORS: Does anything make it worse? (e.g., standing, change in head position)     All the 7. HEART RATE: Can you tell me your heart rate? How many beats in 15 seconds?  (Note: Not all patients can do this.)       no 8. CAUSE: What do you think is causing the dizziness? (e.g., decreased fluids or food, diarrhea, emotional distress, heat exposure, new medicine, sudden standing, vomiting; unknown)     unsure 9. RECURRENT SYMPTOM: Have you had dizziness before? If Yes, ask: When was the last  time? What happened that time?     no 10. OTHER SYMPTOMS: Do you have any other symptoms? (e.g., fever, chest pain, vomiting, diarrhea, bleeding)       no 11. PREGNANCY: Is there any chance you are pregnant? When was your last menstrual period?       no  Protocols used: Dizziness - Lightheadedness-A-AH

## 2024-02-19 NOTE — Progress Notes (Addendum)
 Established Patient Office Visit   Subjective:  Patient ID: Christina Schwartz, female    DOB: Apr 07, 1971  Age: 53 y.o. MRN: [id number]  Chief Complaint  Patient presents with   Dizziness    Lightheaded and off balance.     Dizziness Pertinent negatives include no abdominal pain, headaches, myalgias, rash or weakness.   Encounter Diagnoses  Name Primary?   Lightheadedness Yes   Hypokalemia    2 to 3-day history of intermittent bouts of lightheadedness described as a sinking feeling without spinning.  Denies palpitations nausea chest pain or shortness of breath.  Recently started Diovan  HCT a few months ago.  No difficulties with urination   Review of Systems  Constitutional: Negative.   HENT: Negative.    Eyes:  Negative for blurred vision, discharge and redness.  Respiratory: Negative.    Cardiovascular: Negative.   Gastrointestinal:  Negative for abdominal pain.  Genitourinary: Negative.   Musculoskeletal: Negative.  Negative for myalgias.  Skin:  Negative for rash.  Neurological:  Negative for dizziness, tingling, loss of consciousness, weakness and headaches.  Endo/Heme/Allergies:  Negative for polydipsia.     Current Outpatient Medications:    atenolol  (TENORMIN ) 50 MG tablet, Take 1 tablet (50 mg total) by mouth daily., Disp: 90 tablet, Rfl: 3   atorvastatin  (LIPITOR) 20 MG tablet, Take 1 tablet (20 mg total) by mouth daily., Disp: 90 tablet, Rfl: 3   Blood Glucose Monitoring Suppl (FREESTYLE LITE) DEVI, , Disp: , Rfl:    cetirizine  (ZYRTEC ) 10 MG tablet, Take 1 tablet (10 mg total) by mouth daily., Disp: , Rfl:    fluticasone  (FLONASE ) 50 MCG/ACT nasal spray, Place 2 sprays into both nostrils daily., Disp: 16 g, Rfl: 0   Iron-Vitamins (GERITOL PO), Take 1 tablet by mouth daily., Disp: , Rfl:    Lancets (FREESTYLE) lancets, , Disp: , Rfl:    metFORMIN  (GLUCOPHAGE -XR) 750 MG 24 hr tablet, Take 1 tablet (750 mg total) by mouth daily with breakfast., Disp: 90 tablet,  Rfl: 3   potassium chloride  SA (KLOR-CON  M) 20 MEQ tablet, Take 1 tablet (20 mEq total) by mouth daily., Disp: 30 tablet, Rfl: 3   valsartan -hydrochlorothiazide  (DIOVAN -HCT) 160-25 MG tablet, Take 1 tablet by mouth daily. Discontinue hydrochlorothiazide ., Disp: 90 tablet, Rfl: 1   amoxicillin -clavulanate (AUGMENTIN ) 875-125 MG tablet, Take 1 tablet by mouth 2 (two) times daily. (Patient not taking: Reported on 01/28/2024), Disp: 20 tablet, Rfl: 0   FREESTYLE LITE test strip, Check glucose once a day in the morning, Disp: 100 each, Rfl: 3   meclizine  (ANTIVERT ) 25 MG tablet, Take 1 tablet (25 mg total) by mouth 3 (three) times daily as needed for dizziness. (Patient not taking: Reported on 02/19/2024), Disp: 30 tablet, Rfl: 0   Semaglutide  (RYBELSUS ) 3 MG TABS, Take 1 tablet (3 mg total) by mouth daily. (Patient not taking: Reported on 02/19/2024), Disp: 30 tablet, Rfl: 5   Objective:     BP 134/84 (BP Location: Left Arm, Patient Position: Sitting, Cuff Size: Large)   Pulse 75   Temp (!) 97.4 F (36.3 C) (Temporal)   Ht 5' 5 (1.651 m)   Wt 232 lb 12.8 oz (105.6 kg)   LMP 07/24/2020   SpO2 99%   BMI 38.74 kg/m  BP Readings from Last 3 Encounters:  02/19/24 134/84  02/16/24 (!) 144/82  01/28/24 116/74   Wt Readings from Last 3 Encounters:  02/19/24 232 lb 12.8 oz (105.6 kg)  02/16/24 232 lb (105.2 kg)  01/28/24  234 lb (106.1 kg)      Physical Exam Constitutional:      General: She is not in acute distress.    Appearance: Normal appearance. She is not ill-appearing, toxic-appearing or diaphoretic.  HENT:     Head: Normocephalic and atraumatic.     Right Ear: External ear normal.     Left Ear: External ear normal.     Mouth/Throat:     Mouth: Mucous membranes are dry.     Pharynx: Oropharynx is clear. No oropharyngeal exudate or posterior oropharyngeal erythema.  Eyes:     General: No scleral icterus.       Right eye: No discharge.        Left eye: No discharge.      Extraocular Movements: Extraocular movements intact.     Conjunctiva/sclera: Conjunctivae normal.     Pupils: Pupils are equal, round, and reactive to light.  Cardiovascular:     Rate and Rhythm: Normal rate and regular rhythm.  Pulmonary:     Effort: Pulmonary effort is normal. No respiratory distress.     Breath sounds: Normal breath sounds. No wheezing or rales.  Abdominal:     General: Bowel sounds are normal.  Musculoskeletal:     Cervical back: No rigidity or tenderness.  Lymphadenopathy:     Cervical: No cervical adenopathy.  Skin:    General: Skin is warm and dry.  Neurological:     Mental Status: She is alert and oriented to person, place, and time.  Psychiatric:        Mood and Affect: Mood normal.        Behavior: Behavior normal.      Results for orders placed or performed in visit on 02/19/24  Basic metabolic panel with GFR  Result Value Ref Range   Sodium 138 135 - 145 mEq/L   Potassium 3.4 (L) 3.5 - 5.1 mEq/L   Chloride 99 96 - 112 mEq/L   CO2 31 19 - 32 mEq/L   Glucose, Bld 122 (H) 70 - 99 mg/dL   BUN 17 6 - 23 mg/dL   Creatinine, Ser 9.15 0.40 - 1.20 mg/dL   GFR 20.45 >39.99 mL/min   Calcium  9.4 8.4 - 10.5 mg/dL  CBC  Result Value Ref Range   WBC 6.0 4.0 - 10.5 K/uL   RBC 5.35 (H) 3.87 - 5.11 Mil/uL   Platelets 259.0 150.0 - 400.0 K/uL   Hemoglobin 12.8 12.0 - 15.0 g/dL   HCT 60.5 63.9 - 53.9 %   MCV 73.7 (L) 78.0 - 100.0 fl   MCHC 32.5 30.0 - 36.0 g/dL   RDW 84.5 88.4 - 84.4 %  Urinalysis, Routine w reflex microscopic  Result Value Ref Range   Color, Urine YELLOW Yellow;Lt. Yellow;Straw;Dark Yellow;Amber;Green;Red;Brown   APPearance CLEAR Clear;Turbid;Slightly Cloudy;Cloudy   Specific Gravity, Urine 1.015 1.000 - 1.030   pH 6.0 5.0 - 8.0   Total Protein, Urine NEGATIVE Negative   Urine Glucose NEGATIVE Negative   Ketones, ur NEGATIVE Negative   Bilirubin Urine NEGATIVE Negative   Hgb urine dipstick NEGATIVE Negative   Urobilinogen, UA 0.2  0.0 - 1.0   Leukocytes,Ua NEGATIVE Negative   Nitrite NEGATIVE Negative   WBC, UA 0-2/hpf 0-2/hpf   RBC / HPF none seen 0-2/hpf   Squamous Epithelial / HPF Rare(0-4/hpf) Rare(0-4/hpf)      The 10-year ASCVD risk score (Arnett DK, et al., 2019) is: 11.3%    Assessment & Plan:   Lightheadedness -  Basic metabolic panel with GFR -     CBC -     Urinalysis, Routine w reflex microscopic  Hypokalemia -     Potassium Chloride  Crys ER; Take 1 tablet (20 mEq total) by mouth daily.  Dispense: 30 tablet; Refill: 3    Return Please schedule follow-up with Roselie..  Patient did not tilt with orthostatic checks.  Suspect dehydration but will see what lab work reveals.  Overdue for follow-up with primary.  Elsie Sim Lent, MD

## 2024-02-20 LAB — CBC
HCT: 39.4 % (ref 36.0–46.0)
Hemoglobin: 12.8 g/dL (ref 12.0–15.0)
MCHC: 32.5 g/dL (ref 30.0–36.0)
MCV: 73.7 fl — ABNORMAL LOW (ref 78.0–100.0)
Platelets: 259 K/uL (ref 150.0–400.0)
RBC: 5.35 Mil/uL — ABNORMAL HIGH (ref 3.87–5.11)
RDW: 15.4 % (ref 11.5–15.5)
WBC: 6 K/uL (ref 4.0–10.5)

## 2024-02-20 LAB — BASIC METABOLIC PANEL WITH GFR
BUN: 17 mg/dL (ref 6–23)
CO2: 31 meq/L (ref 19–32)
Calcium: 9.4 mg/dL (ref 8.4–10.5)
Chloride: 99 meq/L (ref 96–112)
Creatinine, Ser: 0.84 mg/dL (ref 0.40–1.20)
GFR: 79.54 mL/min (ref >60.00)
Glucose, Bld: 122 mg/dL — ABNORMAL HIGH (ref 70–99)
Potassium: 3.4 meq/L — ABNORMAL LOW (ref 3.5–5.1)
Sodium: 138 meq/L (ref 135–145)

## 2024-02-20 LAB — URINALYSIS, ROUTINE W REFLEX MICROSCOPIC
Bilirubin Urine: NEGATIVE
Hgb urine dipstick: NEGATIVE
Ketones, ur: NEGATIVE
Leukocytes,Ua: NEGATIVE
Nitrite: NEGATIVE
RBC / HPF: NONE SEEN (ref 0–?)
Specific Gravity, Urine: 1.015 (ref 1.000–1.030)
Total Protein, Urine: NEGATIVE
Urine Glucose: NEGATIVE
Urobilinogen, UA: 0.2 (ref 0.0–1.0)
pH: 6 (ref 5.0–8.0)

## 2024-02-21 ENCOUNTER — Other Ambulatory Visit (HOSPITAL_BASED_OUTPATIENT_CLINIC_OR_DEPARTMENT_OTHER): Payer: Self-pay

## 2024-02-21 ENCOUNTER — Ambulatory Visit: Admitting: Nurse Practitioner

## 2024-02-21 ENCOUNTER — Other Ambulatory Visit (HOSPITAL_COMMUNITY): Payer: Self-pay

## 2024-02-21 ENCOUNTER — Encounter: Payer: Self-pay | Admitting: Family Medicine

## 2024-02-21 ENCOUNTER — Encounter (HOSPITAL_COMMUNITY): Payer: Self-pay

## 2024-02-21 MED ORDER — POTASSIUM CHLORIDE CRYS ER 20 MEQ PO TBCR
20.0000 meq | EXTENDED_RELEASE_TABLET | Freq: Every day | ORAL | 3 refills | Status: DC
Start: 2024-02-21 — End: 2024-05-04
  Filled 2024-02-21 (×2): qty 30, 30d supply, fill #0
  Filled 2024-03-22: qty 30, 30d supply, fill #1
  Filled 2024-04-20: qty 30, 30d supply, fill #2

## 2024-02-21 NOTE — Addendum Note (Signed)
 Addended by: BERNETA ELSIE LABOR on: 02/21/2024 07:56 AM   Modules accepted: Orders

## 2024-02-27 ENCOUNTER — Other Ambulatory Visit (HOSPITAL_BASED_OUTPATIENT_CLINIC_OR_DEPARTMENT_OTHER): Payer: Self-pay

## 2024-02-27 MED ORDER — AMOXICILLIN 500 MG PO CAPS
500.0000 mg | ORAL_CAPSULE | Freq: Three times a day (TID) | ORAL | 0 refills | Status: DC
  Filled 2024-02-27: qty 21, 7d supply, fill #0

## 2024-02-27 MED ORDER — IBUPROFEN 600 MG PO TABS
600.0000 mg | ORAL_TABLET | Freq: Three times a day (TID) | ORAL | 0 refills | Status: AC | PRN
  Filled 2024-02-27: qty 6, 2d supply, fill #0

## 2024-03-14 DIAGNOSIS — H2513 Age-related nuclear cataract, bilateral: Secondary | ICD-10-CM | POA: Diagnosis not present

## 2024-03-14 DIAGNOSIS — H35033 Hypertensive retinopathy, bilateral: Secondary | ICD-10-CM | POA: Diagnosis not present

## 2024-03-14 DIAGNOSIS — E119 Type 2 diabetes mellitus without complications: Secondary | ICD-10-CM | POA: Diagnosis not present

## 2024-03-14 DIAGNOSIS — H04123 Dry eye syndrome of bilateral lacrimal glands: Secondary | ICD-10-CM | POA: Diagnosis not present

## 2024-03-14 DIAGNOSIS — H35363 Drusen (degenerative) of macula, bilateral: Secondary | ICD-10-CM | POA: Diagnosis not present

## 2024-03-14 DIAGNOSIS — H35371 Puckering of macula, right eye: Secondary | ICD-10-CM | POA: Diagnosis not present

## 2024-03-14 LAB — HM DIABETES EYE EXAM

## 2024-03-18 ENCOUNTER — Encounter (INDEPENDENT_AMBULATORY_CARE_PROVIDER_SITE_OTHER): Admitting: Ophthalmology

## 2024-03-20 ENCOUNTER — Encounter (INDEPENDENT_AMBULATORY_CARE_PROVIDER_SITE_OTHER): Payer: Self-pay | Admitting: Ophthalmology

## 2024-03-20 ENCOUNTER — Encounter (INDEPENDENT_AMBULATORY_CARE_PROVIDER_SITE_OTHER): Admitting: Ophthalmology

## 2024-03-20 ENCOUNTER — Encounter: Payer: Self-pay | Admitting: Nurse Practitioner

## 2024-03-20 DIAGNOSIS — H2513 Age-related nuclear cataract, bilateral: Secondary | ICD-10-CM | POA: Diagnosis not present

## 2024-03-20 DIAGNOSIS — Z7984 Long term (current) use of oral hypoglycemic drugs: Secondary | ICD-10-CM | POA: Diagnosis not present

## 2024-03-20 DIAGNOSIS — H35033 Hypertensive retinopathy, bilateral: Secondary | ICD-10-CM | POA: Diagnosis not present

## 2024-03-20 DIAGNOSIS — E113391 Type 2 diabetes mellitus with moderate nonproliferative diabetic retinopathy without macular edema, right eye: Secondary | ICD-10-CM

## 2024-03-20 DIAGNOSIS — H35371 Puckering of macula, right eye: Secondary | ICD-10-CM | POA: Diagnosis not present

## 2024-03-20 DIAGNOSIS — H353131 Nonexudative age-related macular degeneration, bilateral, early dry stage: Secondary | ICD-10-CM

## 2024-03-20 DIAGNOSIS — I10 Essential (primary) hypertension: Secondary | ICD-10-CM

## 2024-03-20 DIAGNOSIS — E113292 Type 2 diabetes mellitus with mild nonproliferative diabetic retinopathy without macular edema, left eye: Secondary | ICD-10-CM

## 2024-03-20 DIAGNOSIS — H43813 Vitreous degeneration, bilateral: Secondary | ICD-10-CM

## 2024-03-25 ENCOUNTER — Encounter: Payer: Self-pay | Admitting: Nurse Practitioner

## 2024-03-27 NOTE — Telephone Encounter (Signed)
 Called patient and asked how she was feeling and if she had any other symptoms that may have occurred since Monday/Tuesday. Patient stated that it was only fever symptoms and that she is back at work after being fever free for 24 hours. She stated that she is fine now and that she will cal the office if it occurs again with other symptoms. I thanked her for taking my call.

## 2024-03-28 ENCOUNTER — Ambulatory Visit: Admitting: Nurse Practitioner

## 2024-04-15 ENCOUNTER — Encounter: Payer: Self-pay | Admitting: Nurse Practitioner

## 2024-04-15 DIAGNOSIS — E1159 Type 2 diabetes mellitus with other circulatory complications: Secondary | ICD-10-CM

## 2024-04-15 DIAGNOSIS — E1165 Type 2 diabetes mellitus with hyperglycemia: Secondary | ICD-10-CM

## 2024-04-15 DIAGNOSIS — E1169 Type 2 diabetes mellitus with other specified complication: Secondary | ICD-10-CM

## 2024-05-01 ENCOUNTER — Ambulatory Visit: Admitting: Nurse Practitioner

## 2024-05-01 ENCOUNTER — Encounter: Payer: Self-pay | Admitting: Nurse Practitioner

## 2024-05-01 VITALS — BP 128/76 | HR 65 | Temp 98.0°F | Ht 65.0 in | Wt 234.6 lb

## 2024-05-01 DIAGNOSIS — Z7984 Long term (current) use of oral hypoglycemic drugs: Secondary | ICD-10-CM | POA: Diagnosis not present

## 2024-05-01 DIAGNOSIS — E040 Nontoxic diffuse goiter: Secondary | ICD-10-CM

## 2024-05-01 DIAGNOSIS — E785 Hyperlipidemia, unspecified: Secondary | ICD-10-CM | POA: Diagnosis not present

## 2024-05-01 DIAGNOSIS — E1159 Type 2 diabetes mellitus with other circulatory complications: Secondary | ICD-10-CM

## 2024-05-01 DIAGNOSIS — D500 Iron deficiency anemia secondary to blood loss (chronic): Secondary | ICD-10-CM

## 2024-05-01 DIAGNOSIS — E1169 Type 2 diabetes mellitus with other specified complication: Secondary | ICD-10-CM

## 2024-05-01 DIAGNOSIS — E1165 Type 2 diabetes mellitus with hyperglycemia: Secondary | ICD-10-CM | POA: Diagnosis not present

## 2024-05-01 DIAGNOSIS — I152 Hypertension secondary to endocrine disorders: Secondary | ICD-10-CM

## 2024-05-01 LAB — COMPREHENSIVE METABOLIC PANEL WITH GFR
ALT: 20 U/L (ref 0–35)
AST: 17 U/L (ref 0–37)
Albumin: 4.2 g/dL (ref 3.5–5.2)
Alkaline Phosphatase: 56 U/L (ref 39–117)
BUN: 18 mg/dL (ref 6–23)
CO2: 30 meq/L (ref 19–32)
Calcium: 9.5 mg/dL (ref 8.4–10.5)
Chloride: 100 meq/L (ref 96–112)
Creatinine, Ser: 0.75 mg/dL (ref 0.40–1.20)
GFR: 91 mL/min (ref >60.00)
Glucose, Bld: 87 mg/dL (ref 70–99)
Potassium: 3.2 meq/L — ABNORMAL LOW (ref 3.5–5.1)
Sodium: 140 meq/L (ref 135–145)
Total Bilirubin: 0.7 mg/dL (ref 0.2–1.2)
Total Protein: 7.4 g/dL (ref 6.0–8.3)

## 2024-05-01 LAB — CBC
HCT: 35.9 % — ABNORMAL LOW (ref 36.0–46.0)
Hemoglobin: 11.8 g/dL — ABNORMAL LOW (ref 12.0–15.0)
MCHC: 32.8 g/dL (ref 30.0–36.0)
MCV: 74.6 fl — ABNORMAL LOW (ref 78.0–100.0)
Platelets: 226 K/uL (ref 150.0–400.0)
RBC: 4.82 Mil/uL (ref 3.87–5.11)
RDW: 17.5 % — ABNORMAL HIGH (ref 11.5–15.5)
WBC: 6.8 K/uL (ref 4.0–10.5)

## 2024-05-01 LAB — HEMOGLOBIN A1C: Hgb A1c MFr Bld: 7.5 % — ABNORMAL HIGH (ref 4.6–6.5)

## 2024-05-01 LAB — TSH: TSH: 0.97 u[IU]/mL (ref 0.35–5.50)

## 2024-05-01 LAB — IBC + FERRITIN
Ferritin: 89.3 ng/mL (ref 10.0–291.0)
Iron: 72 ug/dL (ref 42–145)
Saturation Ratios: 20.9 % (ref 20.0–50.0)
TIBC: 344.4 ug/dL (ref 250.0–450.0)
Transferrin: 246 mg/dL (ref 212.0–360.0)

## 2024-05-01 LAB — MICROALBUMIN / CREATININE URINE RATIO
Creatinine,U: 88.1 mg/dL
Microalb Creat Ratio: UNDETERMINED mg/g (ref 0.0–30.0)
Microalb, Ur: <0.7 mg/dL

## 2024-05-01 LAB — LDL CHOLESTEROL, DIRECT: Direct LDL: 79 mg/dL

## 2024-05-01 NOTE — Assessment & Plan Note (Signed)
 Repeat Tsh today

## 2024-05-01 NOTE — Assessment & Plan Note (Signed)
Repeat cbc and iron panel

## 2024-05-01 NOTE — Patient Instructions (Signed)
 Go to lab Maintain Heart healthy diet and daily exercise. Maintain current medications.

## 2024-05-01 NOTE — Assessment & Plan Note (Signed)
 Bp at goal Current use of atenolol  and valsartan /hydrochlorothiazide  daily Unable to tolerate amlodipine  5mg : diarrhea and ABDOMEN pain BP Readings from Last 3 Encounters:  05/01/24 128/76  02/19/24 134/84  02/16/24 (!) 144/82    Advised about importance of DASH diet Maintain current med doses Repeat Cmp F/up in 3-33month

## 2024-05-01 NOTE — Progress Notes (Signed)
 Established Patient Visit  Patient: Christina Schwartz   DOB: 1971-01-30   53 y.o. Female  MRN: [id number] Visit Date: 05/01/2024  Subjective:    Chief Complaint  Patient presents with   Follow-up    Follow for DM and low potassium    HPI Goiter diffuse Repeat Tsh today  Type 2 diabetes mellitus with hyperglycemia, without long-term current use of insulin (HCC) Current use of metformin  750mg  XR UTD with DIABETES eye exam BP at goal current use of atorvastatin . No neuropathy  Repeat CMP, HgbA1c, UACr and direct LDL Maintain metformin  and rybelsus  doses F/up in 3-74months  Hypertension associated with diabetes (HCC) Bp at goal Current use of atenolol  and valsartan /hydrochlorothiazide  daily Unable to tolerate amlodipine  5mg : diarrhea and ABDOMEN pain BP Readings from Last 3 Encounters:  05/01/24 128/76  02/19/24 134/84  02/16/24 (!) 144/82    Advised about importance of DASH diet Maintain current med doses Repeat Cmp F/up in 3-56month  Iron deficiency anemia due to chronic blood loss Repeat cbc and iron panel   Reviewed medical, surgical, and social history today  Medications: Outpatient Medications Prior to Visit  Medication Sig   atenolol  (TENORMIN ) 50 MG tablet Take 1 tablet (50 mg total) by mouth daily.   atorvastatin  (LIPITOR) 20 MG tablet Take 1 tablet (20 mg total) by mouth daily.   Blood Glucose Monitoring Suppl (FREESTYLE LITE) DEVI    cetirizine  (ZYRTEC ) 10 MG tablet Take 1 tablet (10 mg total) by mouth daily.   fluticasone  (FLONASE ) 50 MCG/ACT nasal spray Place 2 sprays into both nostrils daily.   FREESTYLE LITE test strip Check glucose once a day in the morning   ibuprofen  (ADVIL ) 600 MG tablet Take 1 tablet (600 mg total) by mouth 3 (three) times daily as needed with food.   Iron-Vitamins (GERITOL PO) Take 1 tablet by mouth daily.   Lancets (FREESTYLE) lancets    meclizine  (ANTIVERT ) 25 MG tablet Take 1 tablet (25 mg total) by mouth 3  (three) times daily as needed for dizziness.   metFORMIN  (GLUCOPHAGE -XR) 750 MG 24 hr tablet Take 1 tablet (750 mg total) by mouth daily with breakfast.   potassium chloride  SA (KLOR-CON  M) 20 MEQ tablet Take 1 tablet (20 mEq total) by mouth daily.   Semaglutide  (RYBELSUS ) 3 MG TABS Take 1 tablet (3 mg total) by mouth daily.   valsartan -hydrochlorothiazide  (DIOVAN -HCT) 160-25 MG tablet Take 1 tablet by mouth daily. Discontinue hydrochlorothiazide .   [DISCONTINUED] amoxicillin  (AMOXIL ) 500 MG capsule Take 1 capsule (500 mg total) by mouth 3 (three) times daily until gone.   [DISCONTINUED] amoxicillin -clavulanate (AUGMENTIN ) 875-125 MG tablet Take 1 tablet by mouth 2 (two) times daily.   No facility-administered medications prior to visit.   Reviewed past medical and social history.   ROS per HPI above      Objective:  BP 128/76 (BP Location: Right Arm, Patient Position: Sitting, Cuff Size: Large)   Pulse 65   Temp 98 F (36.7 C) (Oral)   Ht 5' 5 (1.651 m)   Wt 234 lb 9.6 oz (106.4 kg)   LMP 07/24/2020   SpO2 98%   BMI 39.04 kg/m      Physical Exam Vitals and nursing note reviewed.  Constitutional:      Appearance: She is obese.  Cardiovascular:     Rate and Rhythm: Normal rate and regular rhythm.     Pulses: Normal pulses.  Heart sounds: Murmur heard.  Pulmonary:     Effort: Pulmonary effort is normal.     Breath sounds: Normal breath sounds.  Musculoskeletal:     Right lower leg: Edema present.     Left lower leg: Edema present.  Neurological:     Mental Status: She is alert and oriented to person, place, and time.  Psychiatric:        Mood and Affect: Mood normal.        Behavior: Behavior normal.     No results found for any visits on 05/01/24.    Assessment & Plan:    Problem List Items Addressed This Visit     Dyslipidemia associated with type 2 diabetes mellitus (HCC)   Relevant Orders   Direct LDL   Comprehensive metabolic panel with GFR   Goiter  diffuse   Repeat Tsh today      Relevant Orders   TSH   Hypertension associated with diabetes (HCC)   Bp at goal Current use of atenolol  and valsartan /hydrochlorothiazide  daily Unable to tolerate amlodipine  5mg : diarrhea and ABDOMEN pain BP Readings from Last 3 Encounters:  05/01/24 128/76  02/19/24 134/84  02/16/24 (!) 144/82    Advised about importance of DASH diet Maintain current med doses Repeat Cmp F/up in 3-40month      Iron deficiency anemia due to chronic blood loss   Repeat cbc and iron panel      Relevant Orders   CBC   IBC + Ferritin   Type 2 diabetes mellitus with hyperglycemia, without long-term current use of insulin (HCC) - Primary   Current use of metformin  750mg  XR UTD with DIABETES eye exam BP at goal current use of atorvastatin . No neuropathy  Repeat CMP, HgbA1c, UACr and direct LDL Maintain metformin  and rybelsus  doses F/up in 3-31months      Relevant Orders   Microalbumin / creatinine urine ratio   Hemoglobin A1c   Comprehensive metabolic panel with GFR   Return in about 6 months (around 10/30/2024) for HTN, DM, hyperlipidemia (fasting).     Roselie Mood, NP

## 2024-05-01 NOTE — Assessment & Plan Note (Signed)
 Current use of metformin  750mg  XR UTD with DIABETES eye exam BP at goal current use of atorvastatin . No neuropathy  Repeat CMP, HgbA1c, UACr and direct LDL Maintain metformin  and rybelsus  doses F/up in 3-39months

## 2024-05-04 ENCOUNTER — Ambulatory Visit: Payer: Self-pay | Admitting: Nurse Practitioner

## 2024-05-04 DIAGNOSIS — E1169 Type 2 diabetes mellitus with other specified complication: Secondary | ICD-10-CM

## 2024-05-04 DIAGNOSIS — E1159 Type 2 diabetes mellitus with other circulatory complications: Secondary | ICD-10-CM

## 2024-05-04 DIAGNOSIS — E876 Hypokalemia: Secondary | ICD-10-CM

## 2024-05-04 DIAGNOSIS — E1165 Type 2 diabetes mellitus with hyperglycemia: Secondary | ICD-10-CM

## 2024-05-04 MED ORDER — RYBELSUS 7 MG PO TABS
7.0000 mg | ORAL_TABLET | Freq: Every day | ORAL | 1 refills | Status: AC
  Filled 2024-05-04: qty 30, 30d supply, fill #0

## 2024-05-04 MED ORDER — POTASSIUM CHLORIDE CRYS ER 20 MEQ PO TBCR
20.0000 meq | EXTENDED_RELEASE_TABLET | Freq: Every day | ORAL | 0 refills | Status: AC
  Filled 2024-05-04 – 2024-05-25 (×2): qty 90, 90d supply, fill #0

## 2024-05-04 NOTE — Assessment & Plan Note (Signed)
 hgbA1c remained at 7.5%: uncontrolled DIABETES. Increased rybelsus  dose  to 7mg . Take before breakfast. New Prescription sent. Normal urine microalbumin Normal renal and liver function, but low potassium. Resume potassium supplement. New Prescription sent Improved LDL. Maintain atorvastatin  dose F/up in 3months

## 2024-05-05 ENCOUNTER — Other Ambulatory Visit (HOSPITAL_COMMUNITY): Payer: Self-pay

## 2024-05-05 ENCOUNTER — Other Ambulatory Visit: Payer: Self-pay

## 2024-05-15 ENCOUNTER — Other Ambulatory Visit (HOSPITAL_COMMUNITY): Payer: Self-pay

## 2024-05-15 NOTE — Progress Notes (Deleted)
 [id number] Start: *** end: ***  Patient is here today ***. Patient would like to learn ***. Patient lives with ***.  *** shopping and cooking. Pt reports eating out *** times weekly.  Pt reports making the following changes including ***.  All Pt's questions were answered during this encounter.    History includes:  *** Medications include:  *** Labs noted:  ***  7.5, met rybelsus 

## 2024-05-21 ENCOUNTER — Ambulatory Visit: Admitting: Dietician

## 2024-05-21 DIAGNOSIS — E1165 Type 2 diabetes mellitus with hyperglycemia: Secondary | ICD-10-CM

## 2024-05-21 DIAGNOSIS — E1159 Type 2 diabetes mellitus with other circulatory complications: Secondary | ICD-10-CM

## 2024-05-21 DIAGNOSIS — E1169 Type 2 diabetes mellitus with other specified complication: Secondary | ICD-10-CM

## 2024-05-23 NOTE — Patient Instructions (Signed)

## 2024-05-23 NOTE — Progress Notes (Signed)
 PATIENT: Christina Schwartz DOB: October 24, 1970  REASON FOR VISIT: follow up HISTORY FROM: patient  Virtual Visit via MyChart video  I connected with Christina Schwartz on 05/27/24 at  8:30 AM EST via MyChart video and verified that I am speaking with the correct person using two identifiers.   I discussed the limitations, risks, security and privacy concerns of performing an evaluation and management service by Mychart video and the availability of in person appointments. I also discussed with the patient that there may be a patient responsible charge related to this service. The patient expressed understanding and agreed to proceed.   History of Present Illness:  05/27/24 Schwartz (MyChart): Christina Schwartz is a 53 y.o. female here today for follow up for OSA on CPAP. She continues to do well on therapy. She is using CPAP nightly for about 7-8 hours, on average. She is sleeping well. She denies concerns with machine or supplies. She is eligible for a new machine.     05/24/2023 Schwartz: Christina Schwartz returns for follow up for OSA on CPAP. She continues to do well on therapy. She is using CPAP nightly for about 7-8 hours, on average. She denies concerns with machine or supplies. She does endorse improvement in sleep quality on therapy. She has noted elevated BP readings. PCP is following closely. She is planning to adjust meds.       12/07/2021 Schwartz: Christina Schwartz returns for follow up for OSA on CPAP. She is doing well on therapy. She is using CPAP nightly for about 7 hours. She denies concerns with machine or supplies. She does not like dealing with DME. She orders supplies every 3 months due to concerns of over billing.       12/07/2020 Schwartz:  She returns for follow up for OSA on CPAP. She continues to do very well on CPAP. She is using CPAP nightly and more than 4 hours every night. She does continue to note benefit with CPAP. She denies concerns with CPAP machine or supplies.         07/30/2019 Schwartz:   Christina Schwartz is a 53 y.o. female here today for follow up for OSA on CPAP.  Sleep study revealed severe OSA with AHI of 47.5/h and REM AHI of 78/h. O2 nadir of 78%.  She returns today for her initial CPAP review.  She reports that she is doing very well on CPAP therapy.  She denies any difficulty with her machine.  She is currently using a fullface mask but is interested in trying nasal pillows.  No difficulty with current mask but that she might prefer a nasal pillow.  She has noted significant improvements in sleep quality and daytime energy levels.   Compliance report dated 06/29/2019 through 07/28/2019 reveals that she used CPAP 30 out of the last 30 days for compliance of 100%.  28 of the last 30 days she used CPAP greater than 4 hours for compliance of 93%.  Average usage was 6 hours and 49 minutes.  Residual AHI was 3.5 on a set pressure of 8 cm of water and an EPR of 3.  There was no significant leak noted.   HISTORY: (copied from Dr Obie note on 02/13/2019)   Dear Dr. Eldora,    I saw your patient, Christina Schwartz, upon your kind request in my sleep clinic today for initial consultation of her sleep disturbance, in particular, concern for underlying obstructive sleep apnea.  The patient is unaccompanied today.  As you know,  Christina Schwartz is a 53 year old right-handed woman with an underlying medical history of hypertension, diabetes, hypertriglyceridemia, anemia and obesity, who reports snoring and excessive daytime somnolence.  She has a family history of sleep apnea.  I reviewed your office note from 02/05/2019. Her Epworth sleepiness score is 13 out of 24, fatigue severity score is 48 out of 63.  She is typically in bed between 930 and 10 and rise time is around 6.  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  She lives alone and for grown children.  She has no pets in the household.  Recently when she stayed at her mother's house, her sister-in-law  noticed apneas while patient was asleep.  They shared a room at the time.  Her brother has sleep apnea and uses a CPAP machine as well as her sister and she also reports that her father had sleep apnea, he passed away at age 19.  She has rare morning headaches, no night to night nocturia.  She denies any telltale symptoms of restless leg syndrome.  She would be willing to get tested for sleep apnea and consider CPAP therapy.  She is trying to lose weight through weight watchers and has lost 5 pounds thus far.  She is a non-smoker and does not drink alcohol and drinks caffeine and limitation, occasional soda, no daily coffee or tea.   Observations/Objective:  Generalized: Well developed, in no acute distress  Mentation: Alert oriented to time, place, history taking. Follows Schwartz commands speech and language fluent   Assessment and Plan:  53 y.o. year old female  has a past medical history of Anemia (N/a), Diabetes mellitus without complication (HCC), Heart murmur, Hypertension, Kidney stone, Lung nodule, and Vertigo. here with    ICD-10-CM   1. OSA on CPAP  G47.33 For home use only DME continuous positive airway pressure (CPAP)    Home sleep test     Christina Schwartz continues to do well on CPAP therapy. Compliance report shows excellent compliance. She is eligible for a new machine. HST ordered. She was encouraged to continue using CPAP nightly for at least 4 hours. She will follow up with me in 31-90 days following receipt of new CPAP machine.    Orders Placed This Encounter  Procedures   For home use only DME continuous positive airway pressure (CPAP)    Heated Humidity with Schwartz supplies as needed    Length of Need:   Lifetime    Patient has OSA or probable OSA:   Yes    Is the patient currently using CPAP in the home:   Yes    Settings:   Other see comments    CPAP supplies needed:   Mask, headgear, cushions, filters, heated tubing and water chamber   Home sleep test    Standing Status:    Future    Expiration Date:   05/23/2025    Where should this test be performed::   [facility]    No orders of the defined types were placed in this encounter.    Follow Up Instructions:  I discussed the assessment and treatment plan with the patient. The patient was provided an opportunity to ask questions and Schwartz were answered. The patient agreed with the plan and demonstrated an understanding of the instructions.   The patient was advised to call back or seek an in-person evaluation if the symptoms worsen or if the condition fails to improve as anticipated.  I provided 15 minutes of face-to-face and non face-to-face time during this MyChart video encounter. Patient located at their place of residence. Provider is in the office.    Leafy Motsinger, NP

## 2024-05-25 ENCOUNTER — Other Ambulatory Visit: Payer: Self-pay | Admitting: Nurse Practitioner

## 2024-05-25 DIAGNOSIS — Z7984 Long term (current) use of oral hypoglycemic drugs: Secondary | ICD-10-CM

## 2024-05-25 DIAGNOSIS — E1165 Type 2 diabetes mellitus with hyperglycemia: Secondary | ICD-10-CM

## 2024-05-26 ENCOUNTER — Other Ambulatory Visit (HOSPITAL_COMMUNITY): Payer: Self-pay

## 2024-05-26 ENCOUNTER — Other Ambulatory Visit: Payer: Self-pay

## 2024-05-26 MED ORDER — METFORMIN HCL ER 750 MG PO TB24
750.0000 mg | ORAL_TABLET | Freq: Every day | ORAL | 3 refills | Status: AC
  Filled 2024-05-26: qty 90, 90d supply, fill #0

## 2024-05-26 NOTE — Progress Notes (Unsigned)
 [name]

## 2024-05-27 ENCOUNTER — Telehealth: Payer: 59 | Admitting: Family Medicine

## 2024-05-27 ENCOUNTER — Encounter: Payer: Self-pay | Admitting: Family Medicine

## 2024-05-27 DIAGNOSIS — G4733 Obstructive sleep apnea (adult) (pediatric): Secondary | ICD-10-CM

## 2024-05-30 ENCOUNTER — Ambulatory Visit: Admitting: Neurology

## 2024-05-30 DIAGNOSIS — G4733 Obstructive sleep apnea (adult) (pediatric): Secondary | ICD-10-CM

## 2024-06-02 NOTE — Progress Notes (Deleted)
 [id number] or [id number]   Start: *** end: ***  Patient is here today ***. Patient would like to learn ***. Patient lives with ***.  *** shopping and cooking. Pt reports eating out *** times weekly.  Pt reports making the following changes including ***.  All Pt's questions were answered during this encounter.    History includes:  *** Medications include:  *** Labs noted:  ***  7.5, met rybelsus 

## 2024-06-10 ENCOUNTER — Encounter: Admitting: Registered"

## 2024-06-10 ENCOUNTER — Encounter: Payer: Self-pay | Admitting: Registered"

## 2024-06-10 DIAGNOSIS — E119 Type 2 diabetes mellitus without complications: Secondary | ICD-10-CM | POA: Insufficient documentation

## 2024-06-10 NOTE — Progress Notes (Unsigned)
 Diabetes Self-Management Education  Visit Type: Follow-up  Appt. Start Time: 10:05 Appt. End Time: 10:38  06/11/2024  Christina Schwartz, identified by name and date of birth, is a 53 y.o. female with a diagnosis of Diabetes:  .   ASSESSMENT  States yesterday she was traveling from [place]  for Thanksgiving holiday. States she ate junk yesterday due to being on the road. States she wants to walk away from today's appt knowing how she can better manage her food intake. States she has tried meal prepping and its boring to her after day 2 or 3. States she was doing this to prevent eating out and snacking all day. States she feels like making everything on Sunday would work better for her. States she will eat the same meal for 3 days equating making 2 meals on Sunday to last until Friday; will eat out on Saturday.   Lives with 2 adult children.    Last menstrual period 07/24/2020. There is no height or weight on file to calculate BMI.   Diabetes Self-Management Education - 06/10/24 1011       Visit Information   Visit Type Follow-up      Health Coping   How would you rate your overall health? Fair      Psychosocial Assessment   Patient Belief/Attitude about Diabetes Denial   also motivated to manage   What is the hardest part about your diabetes right now, causing you the most concern, or is the most worrisome to you about your diabetes?   Making healty food and beverage choices    Self-care barriers None    Self-management support None    Other persons present Patient    Patient Concerns Nutrition/Meal planning    Special Needs None    Preferred Learning Style Auditory;Visual;Hands on    Learning Readiness Ready    How often do you need to have someone help you when you read instructions, pamphlets, or other written materials from your doctor or pharmacy? 1 - Never    What is the last grade level you completed in school? college      Complications   Last HgB A1C per  patient/outside source 7.5 %    How often do you check your blood sugar? 0 times/day (not testing)    Number of hypoglycemic episodes per month 0    Number of hyperglycemic episodes ( >200mg /dL): Never    Can you tell when your blood sugar is high? Yes    What do you do if your blood sugar is high? will drink a alot of water and will try to talk (if able)    Have you had a dilated eye exam in the past 12 months? Yes    Have you had a dental exam in the past 12 months? No    Are you checking your feet? Yes    How many days per week are you checking your feet? 1      Dietary Intake   Breakfast 7:30am-[employer]-chicken biscuit + hash browns + water    Lunch 12pm-[employer]-burger (with cheese, lettuce, tomato, mayo, ketchup) + fries + water    Dinner 6:30pm-[employer]-burger (with cheese) + fries + water    Beverage(s) water (69 oz), beet juice (4 oz); 73 oz      Activity / Exercise   Activity / Exercise Type ADL's    How many days per week do you exercise? 0    How many minutes per day do you exercise? 0  Total minutes per week of exercise 0      Patient Education   Previous Diabetes Education Yes    Healthy Eating Role of diet in the treatment of diabetes and the relationship between the three main macronutrients and blood glucose level;Plate Method;Information on hints to eating out and maintain blood glucose control.      Post-Education Assessment   Patient understands incorporating nutritional management into lifestyle. Demonstrates understanding / competency    Patient undertands incorporating physical activity into lifestyle. Needs Instruction      Outcomes   Expected Outcomes Demonstrated interest in learning. Expect positive outcomes    Future DMSE 4-6 wks      Subsequent Visit   Since your last visit have you continued or begun to take your medications as prescribed? Yes    Since your last visit have you had your blood pressure checked? Yes    Is your most recent blood  pressure lower, unchanged, or higher since your last visit? Lower    Since your last visit have you experienced any weight changes? Gain    Since your last visit, are you checking your blood glucose at least once a day? No          Individualized Plan for Diabetes Self-Management Training:   Learning Objective:  Patient will have a greater understanding of diabetes self-management. Patient education plan is to attend individual and/or group sessions per assessed needs and concerns.   Plan:   Patient Instructions  - Aim to have 1/2 plate of non-starchy vegetables with lunch and dinner.   - When traveling, choose locations that serve vegetables and/or salads as an option.   - Weekly meals can include:  Grilled chicken + brussels sprouts + mashed potatoes + sparkling water Salisbury steak (made with turkey) + gravy + green beans + roasted potatoes + sparkling water   - Snack to include fruit and unsalted mixed nuts  - Great job with water intake!   Expected Outcomes:  Demonstrated interest in learning. Expect positive outcomes  Education material provided: none  If problems or questions, patient to contact team via:  Phone and Email  Future DSME appointment: 4-6 wks

## 2024-06-10 NOTE — Patient Instructions (Signed)
-   Aim to have 1/2 plate of non-starchy vegetables with lunch and dinner.   - When traveling, choose locations that serve vegetables and/or salads as an option.   - Weekly meals can include:  Grilled chicken + brussels sprouts + mashed potatoes + sparkling water Salisbury steak (made with turkey) + gravy + green beans + roasted potatoes + sparkling water   - Snack to include fruit and unsalted mixed nuts  - Great job with water intake!

## 2024-06-11 ENCOUNTER — Encounter: Admitting: Dietician

## 2024-06-11 DIAGNOSIS — E1165 Type 2 diabetes mellitus with hyperglycemia: Secondary | ICD-10-CM

## 2024-06-11 DIAGNOSIS — E1159 Type 2 diabetes mellitus with other circulatory complications: Secondary | ICD-10-CM

## 2024-06-11 DIAGNOSIS — E1169 Type 2 diabetes mellitus with other specified complication: Secondary | ICD-10-CM

## 2024-06-24 ENCOUNTER — Encounter: Admitting: Registered"

## 2024-07-07 NOTE — Progress Notes (Unsigned)
 See procedure note.

## 2024-07-08 NOTE — Procedures (Signed)
 "   [facility]  HOME SLEEP TEST (SANSA) REPORT (Mail-Out Device):   STUDY DATE: 06/14/24  DOB: Sep 06, 1970  MRN: [id number]  ORDERING CLINICIAN: True Mar, MD, PhD   REFERRING CLINICIAN: Greig Forbes, NP  CLINICAL INFORMATION/HISTORY (obtained from visit note dated 05/27/24, video visit): 53 yo female with an underlying medical history of hypertension, diabetes, hypertriglyceridemia, anemia and obesity, who presents for re-evaluation of her OSA. She has been compliant with her CPAP of 9 cm with EPR of 3 with good tolerance and good apnea control. She should be eligible for a new machine.   BMI (at the time of sleep clinic visit and/or test date): 39 kg/m  FINDINGS:   Study Protocol:    The SANSA single-point-of-skin-contact chest-worn sensor - an FDA cleared and DOT approved type 4 home sleep test device - measures eight physiological channels,  including blood oxygen saturation (measured via PPG [photoplethysmography]), EKG-derived heart rate, respiratory effort, chest movement (measured via accelerometer), snoring, body position, and actigraphy. The device is designed to be worn for up to 10 hours per study.   Sleep Summary:   Total Recording Time (hours, min): 7 hours, 46 min  Total Effective Sleep Time (hours, min):  6 hours, 24 min  Sleep Efficiency (%):    83%   Respiratory Indices:   Calculated sAHI (per hour):  15.7/hour        Calculated sAHIc (central AHI per hour):  0/hour  Oxygen Saturation Statistics:    Oxygen Saturation (%) Mean: 93.1%   Minimum oxygen saturation (%):                 73.7%   O2 Saturation Range (%): 73.7-99.5%   Time below or at 88% saturation: 11 min   Pulse Rate Statistics:   Pulse Mean (bpm):    70/min    Pulse Range (54-99/min)   Snoring: Mild to loud  IMPRESSION/DIAGNOSES:   OSA (obstructive sleep apnea), moderate  RECOMMENDATIONS:   This home sleep test demonstrates moderate obstructive sleep apnea  with a total AHI of 15.7/hour and O2 nadir of 73.7%.  Variable snoring was detected, ranging from mild to loud.  Ongoing treatment with a positive airway pressure (PAP) device is recommended. The patient has been compliant with her current CPAP of 9 cm with EPR of 3 with good tolerance of treatment, compliance with treatment and good apnea control.  She should be eligible for a new machine and I would keep her settings the same, mask of choice, sized to fit.   A full night titration study may be considered to optimize treatment settings, monitor proper oxygen saturations and aid with improvement of tolerance and adherence, if needed down the road. Alternative treatment options may include a dental device through dentistry or orthodontics in selected patients or Inspire (hypoglossal nerve stimulator) in carefully selected patients (meeting inclusion criteria).  Concomitant weight loss is recommended (where clinically appropriate). Please note that untreated obstructive sleep apnea may carry additional perioperative morbidity. Patients with significant obstructive sleep apnea should receive perioperative PAP therapy and the surgeons and particularly the anesthesiologist should be informed of the diagnosis and the severity of the sleep disordered breathing. The patient should be cautioned not to drive, work at heights, or operate dangerous or heavy equipment when tired or sleepy. Review and reiteration of good sleep hygiene measures should be pursued with any patient. Other causes of the patient's symptoms, including circadian rhythm disturbances, an underlying mood disorder, medication effect and/or an underlying medical problem  cannot be ruled out based on this test. Clinical correlation is recommended.  The patient and her referring provider will be notified of the test results. The patient will be seen in follow up in sleep clinic at [facility].  I certify that I have reviewed the raw data recording prior to the  issuance of this report in accordance with the standards of the American Academy of Sleep Medicine (AASM).    INTERPRETING PHYSICIAN:   True Mar, MD, PhD Medical Director, [facility]) Diplomat, ABPN (Neurology and Sleep)   [facility] [address], [address] [place], [place] [address] [phone]         "

## 2024-07-15 ENCOUNTER — Telehealth: Payer: Self-pay

## 2024-07-15 ENCOUNTER — Encounter: Payer: Self-pay | Admitting: Family Medicine

## 2024-07-15 ENCOUNTER — Other Ambulatory Visit: Payer: Self-pay | Admitting: Family Medicine

## 2024-07-15 DIAGNOSIS — G4733 Obstructive sleep apnea (adult) (pediatric): Secondary | ICD-10-CM

## 2024-07-15 NOTE — Telephone Encounter (Signed)
 Order sent thru community msg to [facility]  [phone]

## 2024-07-16 NOTE — Telephone Encounter (Signed)
 Christina Schwartz

## 2024-07-18 ENCOUNTER — Other Ambulatory Visit: Payer: Self-pay | Admitting: Nurse Practitioner

## 2024-07-18 DIAGNOSIS — I152 Hypertension secondary to endocrine disorders: Secondary | ICD-10-CM

## 2024-07-20 ENCOUNTER — Other Ambulatory Visit: Payer: Self-pay | Admitting: Nurse Practitioner

## 2024-07-20 DIAGNOSIS — I152 Hypertension secondary to endocrine disorders: Secondary | ICD-10-CM

## 2024-07-22 ENCOUNTER — Other Ambulatory Visit (HOSPITAL_COMMUNITY): Payer: Self-pay

## 2024-07-22 MED ORDER — VALSARTAN-HYDROCHLOROTHIAZIDE 160-25 MG PO TABS
1.0000 | ORAL_TABLET | Freq: Every day | ORAL | 1 refills | Status: AC
  Filled 2024-07-22: qty 90, 90d supply, fill #0

## 2024-07-22 MED ORDER — ATENOLOL 50 MG PO TABS
50.0000 mg | ORAL_TABLET | Freq: Every day | ORAL | 1 refills | Status: AC
  Filled 2024-07-22: qty 90, 90d supply, fill #0

## 2024-08-12 ENCOUNTER — Ambulatory Visit: Admitting: Nurse Practitioner

## 2024-09-19 ENCOUNTER — Encounter (INDEPENDENT_AMBULATORY_CARE_PROVIDER_SITE_OTHER): Admitting: Ophthalmology

## 2024-10-23 ENCOUNTER — Ambulatory Visit: Admitting: Nurse Practitioner

## 2024-10-31 ENCOUNTER — Ambulatory Visit: Admitting: Nurse Practitioner

## 2024-11-17 ENCOUNTER — Encounter: Admitting: Family Medicine
